# Patient Record
Sex: Female | Born: 1955 | Race: Black or African American | Hispanic: No | Marital: Married | Smoking: Never smoker
Health system: Southern US, Community
[De-identification: ages and names within clinical notes are randomized; demographics above are authoritative.]

## PROBLEM LIST (undated history)

## (undated) ENCOUNTER — Emergency Department (HOSPITAL_COMMUNITY): Payer: Medicare PPO

## (undated) DIAGNOSIS — Z87442 Personal history of urinary calculi: Secondary | ICD-10-CM

## (undated) DIAGNOSIS — I10 Essential (primary) hypertension: Secondary | ICD-10-CM

## (undated) DIAGNOSIS — E7800 Pure hypercholesterolemia, unspecified: Secondary | ICD-10-CM

## (undated) DIAGNOSIS — F419 Anxiety disorder, unspecified: Secondary | ICD-10-CM

## (undated) DIAGNOSIS — E039 Hypothyroidism, unspecified: Secondary | ICD-10-CM

## (undated) DIAGNOSIS — C801 Malignant (primary) neoplasm, unspecified: Secondary | ICD-10-CM

## (undated) DIAGNOSIS — N200 Calculus of kidney: Secondary | ICD-10-CM

## (undated) HISTORY — PX: WISDOM TOOTH EXTRACTION: SHX21

## (undated) HISTORY — PX: ABDOMINAL HYSTERECTOMY: SHX81

## (undated) HISTORY — PX: NECK SURGERY: SHX720

## (undated) HISTORY — PX: KIDNEY SURGERY: SHX687

---

## 2014-12-08 ENCOUNTER — Encounter (HOSPITAL_COMMUNITY): Payer: Self-pay | Admitting: Nurse Practitioner

## 2014-12-08 ENCOUNTER — Emergency Department (HOSPITAL_COMMUNITY)
Admission: EM | Admit: 2014-12-08 | Discharge: 2014-12-08 | Disposition: A | Discharge status: Home / Self Care | Payer: Medicare Other | Attending: Emergency Medicine | Admitting: Emergency Medicine

## 2014-12-08 ENCOUNTER — Encounter (HOSPITAL_COMMUNITY): Payer: Self-pay | Admitting: *Deleted

## 2014-12-08 ENCOUNTER — Emergency Department (INDEPENDENT_AMBULATORY_CARE_PROVIDER_SITE_OTHER)
Admission: EM | Admit: 2014-12-08 | Discharge: 2014-12-08 | Disposition: A | Discharge status: Other Acute Inpatient Hospital | Payer: Medicare Other | Source: Home / Self Care | Attending: Family Medicine | Admitting: Family Medicine

## 2014-12-08 DIAGNOSIS — R197 Diarrhea, unspecified: Secondary | ICD-10-CM | POA: Diagnosis not present

## 2014-12-08 DIAGNOSIS — K2961 Other gastritis with bleeding: Secondary | ICD-10-CM | POA: Diagnosis not present

## 2014-12-08 DIAGNOSIS — Z87442 Personal history of urinary calculi: Secondary | ICD-10-CM | POA: Diagnosis not present

## 2014-12-08 DIAGNOSIS — R5383 Other fatigue: Secondary | ICD-10-CM | POA: Insufficient documentation

## 2014-12-08 DIAGNOSIS — K921 Melena: Secondary | ICD-10-CM | POA: Diagnosis not present

## 2014-12-08 DIAGNOSIS — Z79899 Other long term (current) drug therapy: Secondary | ICD-10-CM | POA: Diagnosis not present

## 2014-12-08 DIAGNOSIS — R110 Nausea: Secondary | ICD-10-CM | POA: Insufficient documentation

## 2014-12-08 DIAGNOSIS — I10 Essential (primary) hypertension: Secondary | ICD-10-CM | POA: Insufficient documentation

## 2014-12-08 DIAGNOSIS — Z880 Allergy status to penicillin: Secondary | ICD-10-CM | POA: Diagnosis not present

## 2014-12-08 HISTORY — DX: Essential (primary) hypertension: I10

## 2014-12-08 HISTORY — DX: Calculus of kidney: N20.0

## 2014-12-08 LAB — POCT I-STAT, CHEM 8
BUN: 15 mg/dL (ref 6–20)
CALCIUM ION: 1.22 mmol/L (ref 1.13–1.30)
CHLORIDE: 103 mmol/L (ref 101–111)
CREATININE: 1 mg/dL (ref 0.44–1.00)
GLUCOSE: 94 mg/dL (ref 65–99)
HCT: 42 % (ref 36.0–46.0)
Hemoglobin: 14.3 g/dL (ref 12.0–15.0)
Potassium: 3.7 mmol/L (ref 3.5–5.1)
Sodium: 145 mmol/L (ref 135–145)
TCO2: 23 mmol/L (ref 0–100)

## 2014-12-08 LAB — CBC
HCT: 39 % (ref 36.0–46.0)
HEMOGLOBIN: 12.6 g/dL (ref 12.0–15.0)
MCH: 27.9 pg (ref 26.0–34.0)
MCHC: 32.3 g/dL (ref 30.0–36.0)
MCV: 86.3 fL (ref 78.0–100.0)
Platelets: 169 10*3/uL (ref 150–400)
RBC: 4.52 MIL/uL (ref 3.87–5.11)
RDW: 13.6 % (ref 11.5–15.5)
WBC: 3.2 10*3/uL — AB (ref 4.0–10.5)

## 2014-12-08 LAB — COMPREHENSIVE METABOLIC PANEL
ALK PHOS: 89 U/L (ref 38–126)
ALT: 19 U/L (ref 14–54)
ANION GAP: 6 (ref 5–15)
AST: 24 U/L (ref 15–41)
Albumin: 3.7 g/dL (ref 3.5–5.0)
BILIRUBIN TOTAL: 0.5 mg/dL (ref 0.3–1.2)
BUN: 12 mg/dL (ref 6–20)
CALCIUM: 9.2 mg/dL (ref 8.9–10.3)
CO2: 24 mmol/L (ref 22–32)
Chloride: 110 mmol/L (ref 101–111)
Creatinine, Ser: 1.01 mg/dL — ABNORMAL HIGH (ref 0.44–1.00)
GFR, EST NON AFRICAN AMERICAN: 59 mL/min — AB (ref >60)
GLUCOSE: 98 mg/dL (ref 65–99)
POTASSIUM: 4.1 mmol/L (ref 3.5–5.1)
Sodium: 140 mmol/L (ref 135–145)
TOTAL PROTEIN: 7 g/dL (ref 6.5–8.1)

## 2014-12-08 LAB — URINALYSIS, ROUTINE W REFLEX MICROSCOPIC
BILIRUBIN URINE: NEGATIVE
Glucose, UA: NEGATIVE mg/dL
Hgb urine dipstick: NEGATIVE
Ketones, ur: NEGATIVE mg/dL
LEUKOCYTES UA: NEGATIVE
NITRITE: NEGATIVE
Protein, ur: NEGATIVE mg/dL
SPECIFIC GRAVITY, URINE: 1.024 (ref 1.005–1.030)
pH: 6 (ref 5.0–8.0)

## 2014-12-08 LAB — POC OCCULT BLOOD, ED: FECAL OCCULT BLD: NEGATIVE

## 2014-12-08 MED ORDER — ONDANSETRON HCL 4 MG PO TABS: 4.0000 mg | ORAL_TABLET | Freq: Four times a day (QID) | ORAL | Status: DC

## 2014-12-08 MED ORDER — ONDANSETRON HCL 4 MG/2ML IJ SOLN
4.0000 mg | Freq: Once | INTRAMUSCULAR | Status: AC
  Administered 2014-12-08: 4 mg via INTRAVENOUS
  Filled 2014-12-08: qty 2

## 2014-12-08 MED ORDER — SODIUM CHLORIDE 0.9 % IV BOLUS (SEPSIS)
1000.0000 mL | Freq: Once | INTRAVENOUS | Status: AC
  Administered 2014-12-08: 1000 mL via INTRAVENOUS

## 2014-12-08 NOTE — ED Provider Notes (Signed)
CSN: [id number]     Arrival date & time 12/08/14  1301 History   First MD Initiated Contact with Patient 12/08/14 1317     Chief Complaint  Patient presents with  . Nausea   (Consider location/radiation/quality/duration/timing/severity/associated sxs/prior Treatment) Patient is a 59 y.o. female presenting with cramps. The history is provided by the patient.  Abdominal Cramping This is a new problem. The current episode started more than 1 week ago. The problem has been gradually worsening. Associated symptoms include abdominal pain. Pertinent negatives include no chest pain.    Past Medical History  Diagnosis Date  . Hypertension    Past Surgical History  Procedure Laterality Date  . Kidney surgery     History reviewed. No pertinent family history. Social History  Substance Use Topics  . Smoking status: Never Smoker   . Smokeless tobacco: None  . Alcohol Use: No   OB History    No data available     Review of Systems  Cardiovascular: Negative.  Negative for chest pain.  Gastrointestinal: Positive for nausea, abdominal pain, diarrhea, blood in stool and anal bleeding. Negative for constipation.    Allergies  Review of patient's allergies indicates no known allergies.  Home Medications   Prior to Admission medications   Medication Sig Start Date End Date Taking? Authorizing Provider  FLUoxetine HCl (PROZAC PO) Take by mouth.   Yes Historical Provider, MD  Gabapentin (NEURONTIN PO) Take by mouth.   Yes Historical Provider, MD  LamoTRIgine (LAMICTAL PO) Take by mouth.   Yes Historical Provider, MD   Meds Ordered and Administered this Visit  Medications - No data to display  BP 166/96 mmHg  Pulse 77  Temp(Src) 98.1 F (36.7 C) (Oral)  Resp 16  SpO2 98% No data found.   Physical Exam  Constitutional: She is oriented to person, place, and time. She appears well-developed and well-nourished. She appears distressed.  Neck: Normal range of motion. Neck supple.   Cardiovascular: Regular rhythm and normal heart sounds.   Pulmonary/Chest: Effort normal and breath sounds normal.  Abdominal: Soft. Bowel sounds are normal. She exhibits no distension and no mass. There is no tenderness. There is no rebound and no guarding.  Lymphadenopathy:    She has no cervical adenopathy.  Neurological: She is alert and oriented to person, place, and time.  Skin: Skin is warm and dry.  Nursing note and vitals reviewed.   ED Course  Procedures (including critical care time)  Labs Review Labs Reviewed  POCT I-STAT, CHEM 8   i-stat8 wnl.  Imaging Review No results found.   Visual Acuity Review  Right Eye Distance:   Left Eye Distance:   Bilateral Distance:    Right Eye Near:   Left Eye Near:    Bilateral Near:         MDM   1. Gastrointestinal hemorrhage associated with other gastritis    Sent for eval of gi bleed, i-stat wnl.    Billy Fischer, MD 12/08/14 [phone]

## 2014-12-08 NOTE — ED Provider Notes (Signed)
CSN: [id number]     Arrival date & time 12/08/14  1422 History   First MD Initiated Contact with Patient 12/08/14 1737     Chief Complaint  Patient presents with  . Nausea   HPI  Catherine [name] is a 59 year old female presenting with nausea and hematochezia. She states she has felt nauseated and "dehydrated" for the past 2 weeks. She states that her stomach feels unsettled but denies any pain. She has not vomited with her nausea. She also reports noting bright red blood in her stool yesterday. She reports feeling constipated a few weeks ago and has had loose stools over the past few days with her nausea. She denies painful bowel movements. Her last colonoscopy was 6 years ago and was normal. She has no known sick contacts or recent travel. She was seen at urgent care earlier today for these complaints and told to come to ED for further evaluation. Denies fevers, chills, headache, dizziness, syncope, blurred vision, chest pain, SOB, cough, dysuria, hematuria, back pain or myalgias. She does endorse fatigue x 2 weeks.   Past Medical History  Diagnosis Date  . Hypertension   . Kidney stones    Past Surgical History  Procedure Laterality Date  . Kidney surgery    . Neck surgery     History reviewed. No pertinent family history. Social History  Substance Use Topics  . Smoking status: Never Smoker   . Smokeless tobacco: None  . Alcohol Use: No   OB History    No data available     Review of Systems  Constitutional: Positive for fatigue. Negative for fever and chills.  HENT: Negative for dental problem and facial swelling.   Eyes: Negative for pain and visual disturbance.  Respiratory: Negative for cough and shortness of breath.   Cardiovascular: Negative for chest pain.  Gastrointestinal: Positive for nausea and blood in stool. Negative for vomiting, abdominal pain and abdominal distention.  Musculoskeletal: Negative for myalgias, back pain, joint swelling, arthralgias, gait problem and  neck pain.  Skin: Negative for wound.  Neurological: Negative for dizziness, syncope, weakness and headaches.  Psychiatric/Behavioral: Negative for confusion.  All other systems reviewed and are negative.     Allergies  Penicillins  Home Medications   Prior to Admission medications   Medication Sig Start Date End Date Taking? Authorizing Provider  CALCIUM PO Take 1 tablet by mouth daily.   Yes Historical Provider, MD  FLUoxetine (PROZAC) 40 MG capsule Take 40 mg by mouth daily. 10/25/14  Yes Historical Provider, MD  gabapentin (NEURONTIN) 100 MG capsule Take 100 mg by mouth daily. 11/19/14  Yes Historical Provider, MD  lamoTRIgine (LAMICTAL) 200 MG tablet Take 200 mg by mouth daily. 10/25/14  Yes Historical Provider, MD  metoprolol tartrate (LOPRESSOR) 25 MG tablet Take 12.5 mg by mouth 2 (two) times daily. 10/25/14  Yes Historical Provider, MD  Multiple Vitamins-Minerals (MULTIVITAMIN PO) Take 1 tablet by mouth daily.   Yes Historical Provider, MD  QUEtiapine (SEROQUEL) 400 MG tablet Take 400 mg by mouth daily. 10/25/14  Yes Historical Provider, MD  ondansetron (ZOFRAN) 4 MG tablet Take 1 tablet (4 mg total) by mouth every 6 (six) hours. 12/08/14   Rinaldo Macqueen, PA-C   BP 148/77 mmHg  Pulse 58  Temp(Src) 97.9 F (36.6 C) (Oral)  Resp 16  Ht 5\' 3"  (1.6 m)  Wt 74.345 kg  BMI 29.04 kg/m2  SpO2 100% Physical Exam  Constitutional: She appears well-developed and well-nourished. No distress.  HENT:  Head: Normocephalic and atraumatic.  Mouth/Throat: Mucous membranes are dry.  Eyes: Conjunctivae are normal. Right eye exhibits no discharge. Left eye exhibits no discharge. No scleral icterus.  Neck: Normal range of motion.  Cardiovascular: Normal rate, regular rhythm and normal heart sounds.   Pulmonary/Chest: Effort normal. No respiratory distress. She has no wheezes. She has no rales.  Abdominal: Soft. She exhibits no distension. There is no tenderness. There is no rebound and no  guarding.  Decreased bowel sounds. Abdomen is soft, non-tender without peritoneal signs  Genitourinary: Guaiac negative stool.  Small amount of soft, brown stool on rectal exam without frank blood. No hemorrhoids or anal fissures. Negative hemoccult  Musculoskeletal: Normal range of motion.  Moves all extremities spontaneously and without pain  Neurological: She is alert. Coordination normal.  Skin: Skin is warm and dry.  Psychiatric: She has a normal mood and affect. Her behavior is normal.  Nursing note and vitals reviewed.   ED Course  Procedures (including critical care time) Labs Review Labs Reviewed  COMPREHENSIVE METABOLIC PANEL - Abnormal; Notable for the following:    Creatinine, Ser 1.01 (*)    GFR calc non Af Amer 59 (*)    All other components within normal limits  CBC - Abnormal; Notable for the following:    WBC 3.2 (*)    All other components within normal limits  URINALYSIS, ROUTINE W REFLEX MICROSCOPIC (NOT AT [facility])  POC OCCULT BLOOD, ED    Imaging Review No results found. I have personally reviewed and evaluated these images and lab results as part of my medical decision-making.   EKG Interpretation None      MDM   Final diagnoses:  Nausea  Diarrhea, unspecified type   59 year old female presenting with nausea and fatigue x 2 weeks. She also notes diarrhea over the past few days. She states yesterday she noted bright red blood in her stool which prompted her to come to the emergency department. Vital signs stable. Patient is nontoxic, nonseptic appearing. Dry mucous membranes. Abdomen is soft, nontender without peritoneal signs. Small amount of soft brown stool on rectal exam without frank blood. Negative Hemoccult. Blood work and urinalysis unremarkable. Patient reports symptom improvement after a bolus and Zofran. Patient likely has a viral gastroenteritis. Will discharge home with zofran and instruction to follow up with PCP. Return precautions given in  discharge paperwork and discussed with pt at bedside. Pt stable for discharge     Josephina Gip, PA-C 12/08/14 2038  Sherwood Gambler, MD 12/09/14 0001

## 2014-12-08 NOTE — ED Notes (Signed)
Pt   Reports      Some    Rectal  Bleeding         When  She  Has       A    bm  And  Some  Nausea                denys  Any  Pain            symptoms  Not  releived  By  pepto  bismal

## 2014-12-08 NOTE — Discharge Instructions (Signed)
Diarrhea °Diarrhea is frequent loose and watery bowel movements. It can cause you to feel weak and dehydrated. Dehydration can cause you to become tired and thirsty, have a dry mouth, and have decreased urination that often is dark yellow. Diarrhea is a sign of another problem, most often an infection that will not last long. In most cases, diarrhea typically lasts 2-3 days. However, it can last longer if it is a sign of something more serious. It is important to treat your diarrhea as directed by your caregiver to lessen or prevent future episodes of diarrhea. °CAUSES  °Some common causes include: °· Gastrointestinal infections caused by viruses, bacteria, or parasites. °· Food poisoning or food allergies. °· Certain medicines, such as antibiotics, chemotherapy, and laxatives. °· Artificial sweeteners and fructose. °· Digestive disorders. °HOME CARE INSTRUCTIONS °· Ensure adequate fluid intake (hydration): Have 1 cup (8 oz) of fluid for each diarrhea episode. Avoid fluids that contain simple sugars or sports drinks, fruit juices, whole milk products, and sodas. Your urine should be clear or pale yellow if you are drinking enough fluids. Hydrate with an oral rehydration solution that you can purchase at pharmacies, retail stores, and online. You can prepare an oral rehydration solution at home by mixing the following ingredients together: °·  - tsp table salt. °· ¾ tsp baking soda. °·  tsp salt substitute containing potassium chloride. °· 1  tablespoons sugar. °· 1 L (34 oz) of water. °· Certain foods and beverages may increase the speed at which food moves through the gastrointestinal (GI) tract. These foods and beverages should be avoided and include: °· Caffeinated and alcoholic beverages. °· High-fiber foods, such as raw fruits and vegetables, nuts, seeds, and whole grain breads and cereals. °· Foods and beverages sweetened with sugar alcohols, such as xylitol, sorbitol, and mannitol. °· Some foods may be well  tolerated and may help thicken stool including: °· Starchy foods, such as rice, toast, pasta, low-sugar cereal, oatmeal, grits, baked potatoes, crackers, and bagels. °· Bananas. °· Applesauce. °· Add probiotic-rich foods to help increase healthy bacteria in the GI tract, such as yogurt and fermented milk products. °· Wash your hands well after each diarrhea episode. °· Only take over-the-counter or prescription medicines as directed by your caregiver. °· Take a warm bath to relieve any burning or pain from frequent diarrhea episodes. °SEEK IMMEDIATE MEDICAL CARE IF:  °· You are unable to keep fluids down. °· You have persistent vomiting. °· You have blood in your stool, or your stools are black and tarry. °· You do not urinate in 6-8 hours, or there is only a small amount of very dark urine. °· You have abdominal pain that increases or localizes. °· You have weakness, dizziness, confusion, or light-headedness. °· You have a severe headache. °· Your diarrhea gets worse or does not get better. °· You have a fever or persistent symptoms for more than 2-3 days. °· You have a fever and your symptoms suddenly get worse. °MAKE SURE YOU:  °· Understand these instructions. °· Will watch your condition. °· Will get help right away if you are not doing well or get worse. °  °This information is not intended to replace advice given to you by your health care provider. Make sure you discuss any questions you have with your health care provider. °  °Document Released: 12/10/2001 Document Revised: 01/10/2014 Document Reviewed: 08/28/2011 °Elsevier Interactive Patient Education ©2016 Elsevier Inc. ° °Nausea and Vomiting °Nausea is a sick feeling that often comes   before throwing up (vomiting). Vomiting is a reflex where stomach contents come out of your mouth. Vomiting can cause severe loss of body fluids (dehydration). Children and elderly adults can become dehydrated quickly, especially if they also have diarrhea. Nausea and  vomiting are symptoms of a condition or disease. It is important to find the cause of your symptoms. °CAUSES  °· Direct irritation of the stomach lining. This irritation can result from increased acid production (gastroesophageal reflux disease), infection, food poisoning, taking certain medicines (such as nonsteroidal anti-inflammatory drugs), alcohol use, or tobacco use. °· Signals from the brain. These signals could be caused by a headache, heat exposure, an inner ear disturbance, increased pressure in the brain from injury, infection, a tumor, or a concussion, pain, emotional stimulus, or metabolic problems. °· An obstruction in the gastrointestinal tract (bowel obstruction). °· Illnesses such as diabetes, hepatitis, gallbladder problems, appendicitis, kidney problems, cancer, sepsis, atypical symptoms of a heart attack, or eating disorders. °· Medical treatments such as chemotherapy and radiation. °· Receiving medicine that makes you sleep (general anesthetic) during surgery. °DIAGNOSIS °Your caregiver may ask for tests to be done if the problems do not improve after a few days. Tests may also be done if symptoms are severe or if the reason for the nausea and vomiting is not clear. Tests may include: °· Urine tests. °· Blood tests. °· Stool tests. °· Cultures (to look for evidence of infection). °· X-rays or other imaging studies. °Test results can help your caregiver make decisions about treatment or the need for additional tests. °TREATMENT °You need to stay well hydrated. Drink frequently but in small amounts. You may wish to drink water, sports drinks, clear broth, or eat frozen ice pops or gelatin dessert to help stay hydrated. When you eat, eating slowly may help prevent nausea. There are also some antinausea medicines that may help prevent nausea. °HOME CARE INSTRUCTIONS  °· Take all medicine as directed by your caregiver. °· If you do not have an appetite, do not force yourself to eat. However, you must  continue to drink fluids. °· If you have an appetite, eat a normal diet unless your caregiver tells you differently. °¨ Eat a variety of complex carbohydrates (rice, wheat, potatoes, bread), lean meats, yogurt, fruits, and vegetables. °¨ Avoid high-fat foods because they are more difficult to digest. °· Drink enough water and fluids to keep your urine clear or pale yellow. °· If you are dehydrated, ask your caregiver for specific rehydration instructions. Signs of dehydration may include: °¨ Severe thirst. °¨ Dry lips and mouth. °¨ Dizziness. °¨ Dark urine. °¨ Decreasing urine frequency and amount. °¨ Confusion. °¨ Rapid breathing or pulse. °SEEK IMMEDIATE MEDICAL CARE IF:  °· You have blood or brown flecks (like coffee grounds) in your vomit. °· You have black or bloody stools. °· You have a severe headache or stiff neck. °· You are confused. °· You have severe abdominal pain. °· You have chest pain or trouble breathing. °· You do not urinate at least once every 8 hours. °· You develop cold or clammy skin. °· You continue to vomit for longer than 24 to 48 hours. °· You have a fever. °MAKE SURE YOU:  °· Understand these instructions. °· Will watch your condition. °· Will get help right away if you are not doing well or get worse. °  °This information is not intended to replace advice given to you by your health care provider. Make sure you discuss any questions you have with your health care   provider.   Document Released: 12/20/2004 Document Revised: 03/14/2011 Document Reviewed: 05/19/2010 Elsevier Interactive Patient Education 2016 [employer].   Emergency Department Resource Guide 1) Find a Doctor and Pay Out of Pocket Although you won't have to find out who is covered by your insurance plan, it is a good idea to ask around and get recommendations. You will then need to call the office and see if the doctor you have chosen will accept you as a new patient and what types of options they offer for  patients who are self-pay. Some doctors offer discounts or will set up payment plans for their patients who do not have insurance, but you will need to ask so you aren't surprised when you get to your appointment.  2) Contact Your Local Health Department Not all health departments have doctors that can see patients for sick visits, but many do, so it is worth a call to see if yours does. If you don't know where your local health department is, you can check in your phone book. The CDC also has a tool to help you locate your state's health department, and many state websites also have listings of all of their local health departments.  3) Find a [facility] If your illness is not likely to be very severe or complicated, you may want to try a walk in clinic. These are popping up all over the country in pharmacies, drugstores, and shopping centers. They're usually staffed by nurse practitioners or physician assistants that have been trained to treat common illnesses and complaints. They're usually fairly quick and inexpensive. However, if you have serious medical issues or chronic medical problems, these are probably not your best option.  No Primary Care Doctor: - Call Health Connect at  [phone] - they can help you locate a primary care doctor that  accepts your insurance, provides certain services, etc. - Physician Referral Service- [phone]  Chronic Pain Problems: Organization         Address  Phone   Notes  [facility]  [phone] Patients need to be referred by their primary care doctor.   Medication Assistance: Organization         Address  Phone   Notes  [facility] [place] [address] [place], [place] [address] [phone] --Must be a resident of [facility] -- Must have NO insurance coverage whatsoever (no Medicaid/ Medicare, etc.) -- The pt. MUST have a primary care doctor that directs their care regularly  and follows them in the community   Med[facility]ist  [phone]   [employer]  [phone]    Agencies that provide inexpensive medical care: Organization         Address  Phone   Notes  [place]  [phone]   [facility]    [phone]   [facility] [place], [place] [address] [phone]   [place] [address], [place] [phone]   Planned Parenthood    [phone]   [facility]    [phone]   [place] [address], [place] Phone:  [phone], Fax:  [phone] Hours of Operation:  9 am - 6 pm, M-F.  Also accepts Medicaid/Medicare and self-pay.  [facility] [place] [address], [place] Phone: [phone], Fax: [phone])  [id number]. Hours of Operation:  8:30 am - 5:30 pm, M-F.  Also accepts Medicaid and self-pay.  [facility] [address], [place] Phone: [phone]   [place], [place], [place] [phone], Ext. [phone] Mondays & Thursdays: 7-9 AM.  First 15 patients are seen on a first come, first serve basis.    [place] Providers:  Organization         Address  Phone   Notes  [facility] [address], [place] [phone] Also accepts self-pay patients.  [facility] [id number] [place], [place]  [phone]   [place], [address], [place] [phone]   [facility] [address], [place] [phone]   Lucianne Lei [address], [place]   [phone] Only accepts [place] Access [place] patients after they have their name applied to their card.   Self-Pay (no insurance) in [facility]:  Organization         Address  Phone   Notes  Sickle  Cell Patients, [facility] [place] [phone]   [facility] [place] [phone]   [facility] [place]  [place], [place], [place] [phone]   [facility]/Dr. Osei-Bonsu  [address] [place] or [address], [place] [phone] Phone number for both [place] and [place] locations is the same.  [facility] [address] [place] [phone]   [facility] [address], [place] or [address] [phone]   [facility] [address] [place] [phone], phone; [phone], fax Sees patients 1st and 3rd Saturday of every month.  Must not qualify for public or private insurance (i.e. Medicaid, Medicare, [place] Health Choice, Veterans' Benefits)  Household income should be no more than 200% of the poverty level The clinic cannot treat you if you are pregnant or think you are pregnant  Sexually transmitted diseases are not treated at the clinic.    Dental Care: Organization         Address  Phone  Notes  [facility] [place] [phone] Accepts children up to age 43 who are enrolled in [place] or [place]; pregnant women with a Medicaid card; and children who have applied for Medicaid or [place] Health Choice, but were declined, whose parents can pay a reduced fee at time of service.  [facility] of [facility]  [address], [place] [phone] Accepts children up to age 49 who are enrolled in [place] or [place]; pregnant women with a Medicaid card; and children who have applied for Medicaid or [place] Health Choice, but were declined, whose parents can pay a reduced fee at time of service.  [facility]  Access PROGRAM  [place] [phone] Patients are seen by appointment only. Walk-ins are not accepted. [place] will see patients 80 years of age and older. Monday - Tuesday (8am-5pm) Most Wednesdays (8:30-5pm) $30 per visit, cash only  [facility] Adult [employer] PROGRAM  [address], [place] [phone]  Patients are seen by appointment only. Walk-ins are not accepted. [place] will see patients 97 years of age and older. One Wednesday Evening (Monthly: Volunteer Based).  $30 per visit, cash only  [place]  [phone] for adults; Children under age 58, call Graduate Pediatric Dentistry at [phone]. Children aged 36-14, please call [phone] to request a pediatric application.  Dental services are provided in all areas of dental care including fillings, crowns and bridges, complete and partial dentures, implants, gum treatment, root canals, and extractions. Preventive care is also provided. Treatment is provided to both adults and children. Patients are selected via a lottery and there is often a waiting list.   [facility] [address], [place]  [phone] [email or web address]   [facility] [address] [place], [place] [phone], Ext. [phone] Second and Fourth Thursday of each month, opens at 6:30 AM; Clinic ends at 9 AM.  Patients are seen on a first-come first-served basis, and a limited number are seen during each clinic.   [facility]  [address] Hillard Danker [place], [place] [phone]   Eligibility Requirements You must have lived in [place], [place], or [place] counties for at least the last three months.   You cannot be eligible for state or federal sponsored [employer], including [employer], [place], or [employer].   You generally cannot be eligible for healthcare insurance through your employer.    How to apply: Eligibility  screenings are held every Tuesday and Wednesday afternoon from 1:00 pm until 4:00 pm. You do not need an appointment for the interview!  [facility] [address] [place], [place]   [place]  [place] Department  [place]  [phone]    Behavioral Health Resources in the Community: Intensive Outpatient Programs Organization         Address  Phone  Notes  [place]eddito [place]. [address], [place], [place] [phone]   [facility] [address], [place] [employer], [place]   ADS: Alcohol & Drug Svcs [address], [place], [place]   [place] [address],  [place], [place] or [phone]   Substance Abuse Resources Organization         Address  Phone  Notes  Alcohol and Drug Services  [phone]   [place]  [phone]   [facility] [place]   Chinita Pester  [phone]   Residential & Outpatient Substance Abuse Program  [phone]   Psychological Services Organization         Address  Phone  Notes  [facility] [place]  [place]  [phone]   [place] [address], [place] or [phone]    Mobile Crisis Teams Organization         Address  Phone  Notes  [facility], Mobile Crisis Care Unit  [phone]   [facility]  [address] [place], [place]   [place] [address], [place] [phone]    Self-Help/Support Groups Organization         Address  Phone             Notes  [place]. of [place] - variety of support groups  [place] Call  for more information  Narcotics Anonymous (NA), [facility] [address], [place]  2 meetings at  this location   Residential Facilities manager         Address  Phone  Notes  [facility] [place],    [place]  [phone]   [facility]  [address], [place] [id number], [place], [place]   [place], [place] [phone] Admissions: 8am-3pm M-F  Incentives Substance [place] [address]    [place], [place] [phone]   [facility] [address] [place], [place], [place]   [facility] [address]  [place], [place]   [facility] - Intensive Outpatient [address], [place], [place]   [facility] ([place].,  [place], [place] [phone] or [phone]   [facility] Services (RTS) [address] [place], [place] Accepts Medicaid  [facility] [place] [address]  [place] [phone] Substance Abuse/Addiction Treatment   [facility] Organization         Address  Phone  Notes  [facility]  [phone]   Domenic Schwab, PhD [address] Arlis Porta [place], [place]   [phone] or [phone]   [place], [place] [phone]   [facility] [address] [place], [place] [phone] Insurance/Medicaid/sponsorship through [facility] and Families [address]                                    [place], [place] [phone] [place] [address], [place] [phone]    Dr. Adele Schilder  [phone]   [facility] [place] Dept. 1) [address] [place] 2) [place] 3)  [place] [address], [place] [phone]  [phone]   [place] [phone] or [phone] (After Hours)

## 2014-12-08 NOTE — ED Notes (Signed)
She c/o 2 week history nausea, "feeling dehydrated" and yesterday she noticed blood in her stool x1.  She denies any pain. She had a colonoscopy 6 years.

## 2015-04-07 ENCOUNTER — Ambulatory Visit (INDEPENDENT_AMBULATORY_CARE_PROVIDER_SITE_OTHER)
Admission: EM | Admit: 2015-04-07 | Discharge: 2015-04-07 | Disposition: A | Discharge status: Home / Self Care | Payer: Medicare Other | Source: Home / Self Care

## 2015-04-07 ENCOUNTER — Encounter (HOSPITAL_COMMUNITY): Payer: Self-pay | Admitting: *Deleted

## 2015-04-07 DIAGNOSIS — M7582 Other shoulder lesions, left shoulder: Secondary | ICD-10-CM | POA: Diagnosis not present

## 2015-04-07 MED ORDER — NAPROXEN 375 MG PO TABS: 375.0000 mg | ORAL_TABLET | Freq: Two times a day (BID) | ORAL | Status: DC

## 2015-04-07 NOTE — Discharge Instructions (Signed)

## 2015-04-07 NOTE — ED Provider Notes (Signed)
CSN: [id number]     Arrival date & time 04/07/15  1332 History   None    No chief complaint on file.  (Consider location/radiation/quality/duration/timing/severity/associated sxs/prior Treatment) Patient is a 60 y.o. female presenting with shoulder pain. The history is provided by the patient.  Shoulder Pain Location:  Shoulder Time since incident:  5 months Injury: no   Shoulder location:  L shoulder Pain details:    Quality:  Aching   Radiates to:  Does not radiate   Severity:  Mild   Onset quality:  Gradual   Duration:  5 months   Timing:  Constant   Progression:  Waxing and waning Chronicity:  Recurrent Handedness:  Right-handed Dislocation: no   Foreign body present:  No foreign bodies Prior injury to area:  No Relieved by:  Nothing Worsened by:  Nothing tried Ineffective treatments:  None tried   Past Medical History  Diagnosis Date  . Hypertension   . Kidney stones    Past Surgical History  Procedure Laterality Date  . Kidney surgery    . Neck surgery     No family history on file. Social History  Substance Use Topics  . Smoking status: Never Smoker   . Smokeless tobacco: Not on file  . Alcohol Use: No   OB History    No data available     Review of Systems  Constitutional: Negative.   HENT: Negative.   Eyes: Negative.   Respiratory: Negative.   Cardiovascular: Negative.   Gastrointestinal: Negative.   Endocrine: Negative.   Genitourinary: Negative.   Musculoskeletal: Positive for arthralgias.  Skin: Negative.   Allergic/Immunologic: Negative.   Neurological: Negative.   Hematological: Negative.   Psychiatric/Behavioral: Negative.     Allergies  Penicillins  Home Medications   Prior to Admission medications   Medication Sig Start Date End Date Taking? Authorizing Provider  CALCIUM PO Take 1 tablet by mouth daily.    Historical Provider, MD  FLUoxetine (PROZAC) 40 MG capsule Take 40 mg by mouth daily. 10/25/14   Historical Provider, MD   gabapentin (NEURONTIN) 100 MG capsule Take 100 mg by mouth daily. 11/19/14   Historical Provider, MD  lamoTRIgine (LAMICTAL) 200 MG tablet Take 200 mg by mouth daily. 10/25/14   Historical Provider, MD  metoprolol tartrate (LOPRESSOR) 25 MG tablet Take 12.5 mg by mouth 2 (two) times daily. 10/25/14   Historical Provider, MD  Multiple Vitamins-Minerals (MULTIVITAMIN PO) Take 1 tablet by mouth daily.    Historical Provider, MD  ondansetron (ZOFRAN) 4 MG tablet Take 1 tablet (4 mg total) by mouth every 6 (six) hours. 12/08/14   Stevi Barrett, PA-C  QUEtiapine (SEROQUEL) 400 MG tablet Take 400 mg by mouth daily. 10/25/14   Historical Provider, MD   Meds Ordered and Administered this Visit  Medications - No data to display  There were no vitals taken for this visit. No data found.   Physical Exam  Constitutional: She appears well-developed and well-nourished.  HENT:  Head: Normocephalic.  Right Ear: External ear normal.  Left Ear: External ear normal.  Mouth/Throat: Oropharynx is clear and moist.  Eyes: Conjunctivae and EOM are normal. Pupils are equal, round, and reactive to light.  Neck: Normal range of motion. Neck supple.  Cardiovascular: Normal rate, regular rhythm and normal heart sounds.   Pulmonary/Chest: Effort normal and breath sounds normal.  Abdominal: Soft. Bowel sounds are normal.  Musculoskeletal: She exhibits tenderness.  Decreased passive and active ROM left shoulder and discomfort with internal/external rotation  and abduction.    ED Course  Procedures (including critical care time)  Labs Review Labs Reviewed - No data to display  Imaging Review No results found.   Visual Acuity Review  Right Eye Distance:   Left Eye Distance:   Bilateral Distance:    Right Eye Near:   Left Eye Near:    Bilateral Near:         MDM   Left shoulder pain Naprosyn 375mg  one po bid x 10 days.  Recommend circumduction exercises and follow up prn.  Lysbeth Penner,  FNP 04/07/15 [place], [place] 04/07/15 2120

## 2015-04-07 NOTE — ED Notes (Signed)
Left shoulder pain the past 5 months, worse with movement.  No known injury

## 2019-11-02 ENCOUNTER — Ambulatory Visit: Payer: Medicare Other | Attending: Internal Medicine

## 2019-11-02 DIAGNOSIS — Z23 Encounter for immunization: Secondary | ICD-10-CM

## 2019-11-02 NOTE — Progress Notes (Signed)
   Covid-19 Vaccination Clinic  Name:  Catherine [name]    MRN: [id number] DOB: 1955/04/02  11/02/2019  Catherine [name] was observed post Covid-19 immunization for 15 minutes without incident. She was provided with Vaccine Information Sheet and instruction to access the V-Safe system.   Catherine [name] was instructed to call 911 with any severe reactions post vaccine: [place] Kitchen Difficulty breathing  . Swelling of face and throat  . A fast heartbeat  . A bad rash all over body  . Dizziness and weakness

## 2020-05-20 DIAGNOSIS — D49511 Neoplasm of unspecified behavior of right kidney: Secondary | ICD-10-CM | POA: Diagnosis not present

## 2020-05-20 DIAGNOSIS — N200 Calculus of kidney: Secondary | ICD-10-CM | POA: Diagnosis not present

## 2020-06-03 DIAGNOSIS — E785 Hyperlipidemia, unspecified: Secondary | ICD-10-CM | POA: Diagnosis not present

## 2020-06-03 DIAGNOSIS — Z1211 Encounter for screening for malignant neoplasm of colon: Secondary | ICD-10-CM | POA: Diagnosis not present

## 2020-06-03 DIAGNOSIS — E039 Hypothyroidism, unspecified: Secondary | ICD-10-CM | POA: Diagnosis not present

## 2020-06-03 DIAGNOSIS — F419 Anxiety disorder, unspecified: Secondary | ICD-10-CM | POA: Diagnosis not present

## 2020-06-03 DIAGNOSIS — I10 Essential (primary) hypertension: Secondary | ICD-10-CM | POA: Diagnosis not present

## 2020-06-03 DIAGNOSIS — F331 Major depressive disorder, recurrent, moderate: Secondary | ICD-10-CM | POA: Diagnosis not present

## 2020-06-03 DIAGNOSIS — N289 Disorder of kidney and ureter, unspecified: Secondary | ICD-10-CM | POA: Diagnosis not present

## 2020-06-11 DIAGNOSIS — D49511 Neoplasm of unspecified behavior of right kidney: Secondary | ICD-10-CM | POA: Diagnosis not present

## 2020-06-11 DIAGNOSIS — Z90710 Acquired absence of both cervix and uterus: Secondary | ICD-10-CM | POA: Diagnosis not present

## 2020-06-11 DIAGNOSIS — N200 Calculus of kidney: Secondary | ICD-10-CM | POA: Diagnosis not present

## 2020-06-11 DIAGNOSIS — D3001 Benign neoplasm of right kidney: Secondary | ICD-10-CM | POA: Diagnosis not present

## 2020-06-11 DIAGNOSIS — D7389 Other diseases of spleen: Secondary | ICD-10-CM | POA: Diagnosis not present

## 2020-06-16 DIAGNOSIS — L989 Disorder of the skin and subcutaneous tissue, unspecified: Secondary | ICD-10-CM | POA: Diagnosis not present

## 2020-06-19 ENCOUNTER — Other Ambulatory Visit: Payer: Self-pay | Admitting: Urology

## 2020-06-19 DIAGNOSIS — D7389 Other diseases of spleen: Secondary | ICD-10-CM

## 2020-07-15 ENCOUNTER — Ambulatory Visit
Admission: RE | Admit: 2020-07-15 | Discharge: 2020-07-15 | Disposition: A | Discharge status: Home / Self Care | Payer: Medicare Other | Source: Ambulatory Visit | Attending: Urology | Admitting: Urology

## 2020-07-15 ENCOUNTER — Other Ambulatory Visit: Payer: Self-pay

## 2020-07-15 DIAGNOSIS — D7389 Other diseases of spleen: Secondary | ICD-10-CM | POA: Diagnosis not present

## 2020-07-15 DIAGNOSIS — M47816 Spondylosis without myelopathy or radiculopathy, lumbar region: Secondary | ICD-10-CM | POA: Diagnosis not present

## 2020-07-15 DIAGNOSIS — M5137 Other intervertebral disc degeneration, lumbosacral region: Secondary | ICD-10-CM | POA: Diagnosis not present

## 2020-07-15 DIAGNOSIS — D1771 Benign lipomatous neoplasm of kidney: Secondary | ICD-10-CM | POA: Diagnosis not present

## 2020-07-15 MED ORDER — GADOBENATE DIMEGLUMINE 529 MG/ML IV SOLN
16.0000 mL | Freq: Once | INTRAVENOUS | Status: AC | PRN
  Administered 2020-07-15: 16 mL via INTRAVENOUS

## 2020-08-03 DIAGNOSIS — E039 Hypothyroidism, unspecified: Secondary | ICD-10-CM | POA: Diagnosis not present

## 2020-08-03 DIAGNOSIS — F3340 Major depressive disorder, recurrent, in remission, unspecified: Secondary | ICD-10-CM | POA: Diagnosis not present

## 2020-08-03 DIAGNOSIS — I10 Essential (primary) hypertension: Secondary | ICD-10-CM | POA: Diagnosis not present

## 2020-08-03 DIAGNOSIS — D7389 Other diseases of spleen: Secondary | ICD-10-CM | POA: Diagnosis not present

## 2020-08-26 DIAGNOSIS — D170 Benign lipomatous neoplasm of skin and subcutaneous tissue of head, face and neck: Secondary | ICD-10-CM | POA: Diagnosis not present

## 2020-08-26 DIAGNOSIS — L821 Other seborrheic keratosis: Secondary | ICD-10-CM | POA: Diagnosis not present

## 2020-08-31 DIAGNOSIS — N2889 Other specified disorders of kidney and ureter: Secondary | ICD-10-CM | POA: Diagnosis not present

## 2020-08-31 DIAGNOSIS — R161 Splenomegaly, not elsewhere classified: Secondary | ICD-10-CM | POA: Diagnosis not present

## 2020-11-11 DIAGNOSIS — I10 Essential (primary) hypertension: Secondary | ICD-10-CM | POA: Diagnosis not present

## 2020-11-11 DIAGNOSIS — F419 Anxiety disorder, unspecified: Secondary | ICD-10-CM | POA: Diagnosis not present

## 2020-11-11 DIAGNOSIS — D7389 Other diseases of spleen: Secondary | ICD-10-CM | POA: Diagnosis not present

## 2020-11-11 DIAGNOSIS — E785 Hyperlipidemia, unspecified: Secondary | ICD-10-CM | POA: Diagnosis not present

## 2020-11-11 DIAGNOSIS — F3340 Major depressive disorder, recurrent, in remission, unspecified: Secondary | ICD-10-CM | POA: Diagnosis not present

## 2020-11-11 DIAGNOSIS — E039 Hypothyroidism, unspecified: Secondary | ICD-10-CM | POA: Diagnosis not present

## 2020-11-11 DIAGNOSIS — Z8679 Personal history of other diseases of the circulatory system: Secondary | ICD-10-CM | POA: Diagnosis not present

## 2020-11-11 DIAGNOSIS — Z1211 Encounter for screening for malignant neoplasm of colon: Secondary | ICD-10-CM | POA: Diagnosis not present

## 2020-11-16 ENCOUNTER — Other Ambulatory Visit: Payer: Self-pay | Admitting: Urology

## 2020-11-16 DIAGNOSIS — D49511 Neoplasm of unspecified behavior of right kidney: Secondary | ICD-10-CM

## 2020-12-14 ENCOUNTER — Other Ambulatory Visit: Payer: Medicare Other

## 2021-01-12 DIAGNOSIS — Z1231 Encounter for screening mammogram for malignant neoplasm of breast: Secondary | ICD-10-CM | POA: Diagnosis not present

## 2021-01-18 DIAGNOSIS — N6313 Unspecified lump in the right breast, lower outer quadrant: Secondary | ICD-10-CM | POA: Diagnosis not present

## 2021-01-18 DIAGNOSIS — R922 Inconclusive mammogram: Secondary | ICD-10-CM | POA: Diagnosis not present

## 2021-01-29 ENCOUNTER — Other Ambulatory Visit: Payer: Self-pay | Admitting: Hematology and Oncology

## 2021-01-29 DIAGNOSIS — C50511 Malignant neoplasm of lower-outer quadrant of right female breast: Secondary | ICD-10-CM | POA: Diagnosis not present

## 2021-01-29 DIAGNOSIS — N6313 Unspecified lump in the right breast, lower outer quadrant: Secondary | ICD-10-CM | POA: Diagnosis not present

## 2021-01-29 DIAGNOSIS — N6310 Unspecified lump in the right breast, unspecified quadrant: Secondary | ICD-10-CM | POA: Diagnosis not present

## 2021-01-29 DIAGNOSIS — Z170 Estrogen receptor positive status [ER+]: Secondary | ICD-10-CM | POA: Diagnosis not present

## 2021-02-04 ENCOUNTER — Telehealth: Payer: Self-pay | Admitting: Hematology and Oncology

## 2021-02-04 NOTE — Telephone Encounter (Signed)
Scheduled appt per 2/2 staff msg with RN Dawn. Pt is aware of appt date and time. Pt is aware to arrive 15 mins prior to appt time.

## 2021-02-06 NOTE — Progress Notes (Signed)
[name] NOTE  Patient Care Team: Catherine Stains, MD as PCP - General (Family Medicine)  CHIEF COMPLAINTS/PURPOSE OF CONSULTATION:  Newly diagnosed right breast cancer  HISTORY OF PRESENTING ILLNESS:  Catherine [name] 66 y.o. 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 Screening mammogram on 01/12/2021 showed right breast asymmetry. Diagnostic mammogram and [place] on 01/18/2021 showed a persistent irregular mass within the lower outer right breast. 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 She presents to the clinic today for initial evaluation and discussion of treatment options.   I reviewed her records extensively and collaborated the history with the patient.  SUMMARY OF ONCOLOGIC HISTORY: Oncology History  Malignant neoplasm of lower-outer quadrant of right breast of female, estrogen receptor positive ([place])  01/29/2021 Initial Diagnosis   Screening mammogram detected right breast mass 1.2 cm, axilla normal, biopsy revealed grade 2 IDC with DCIS ER 90%, PR 30%, HER2 2+ by IHC FISH pending     MEDICAL HISTORY:  Past Medical History:  Diagnosis Date   Hypertension    Kidney stones     SURGICAL HISTORY: Past Surgical History:  Procedure Laterality Date   KIDNEY SURGERY     NECK SURGERY      SOCIAL HISTORY: Social History   Socioeconomic History   Marital status: Married    Spouse name: Not on file   Number of children: Not on file   Years of education: Not on file   Highest education level: Not on file  Occupational History   Not on file  Tobacco Use   Smoking status: Never   Smokeless tobacco: Not on file  Substance and Sexual Activity   Alcohol use: No   Drug use: No   Sexual activity: Yes    Birth control/protection: Surgical  Other Topics Concern   Not on file  Social History Narrative   Not  on file   Social Determinants of Health   Financial Resource Strain: Not on file  Food Insecurity: Not on file  Transportation Needs: Not on file  Physical Activity: Not on file  Stress: Not on file  Social Connections: Not on file  Intimate Partner Violence: Not on file    FAMILY HISTORY: No family history on file.  ALLERGIES:  is allergic to penicillins.  MEDICATIONS:  Current Outpatient Medications  Medication Sig Dispense Refill   Calcium Magnesium Zinc 333-133-5 MG TABS Take 1 tablet by mouth daily.     ergocalciferol (VITAMIN D2) 1.25 MG (50000 UT) capsule Take 1 capsule (50,000 Units total) by mouth once a week.     letrozole (FEMARA) 2.5 MG tablet Take 1 tablet (2.5 mg total) by mouth daily. 90 tablet 3   levothyroxine (SYNTHROID) 50 MCG tablet Take 1 tablet (50 mcg total) by mouth daily before breakfast.     rosuvastatin (CRESTOR) 5 MG tablet Take 1 tablet (5 mg total) by mouth daily.     metoprolol tartrate (LOPRESSOR) 25 MG tablet Take 12.5 mg by mouth 2 (two) times daily.     No current facility-administered medications for this visit.    REVIEW OF SYSTEMS:   Constitutional: Denies fevers, chills or abnormal night sweats Eyes: Denies blurriness of vision, double vision or watery eyes Ears, nose, mouth, throat, and face: Denies mucositis or sore throat Respiratory: Denies cough, dyspnea or wheezes Cardiovascular: Denies palpitation, chest discomfort or lower extremity  swelling Gastrointestinal:  Denies nausea, heartburn or change in bowel habits Skin: Denies abnormal skin rashes Lymphatics: Denies new lymphadenopathy or easy bruising Neurological:Denies numbness, tingling or new weaknesses Behavioral/Psych: Mood is stable, no new changes  Breast:  Denies any palpable lumps or discharge All other systems were reviewed with the patient and are negative.  PHYSICAL EXAMINATION: ECOG PERFORMANCE STATUS: 0 - Asymptomatic  Vitals:   02/08/21 1205  BP: (!) 145/84   Pulse: 67  Resp: 18  Temp: 97.9 F (36.6 C)  SpO2: 99%   Filed Weights   02/08/21 1205  Weight: 188 lb 4.8 oz (85.4 kg)      LABORATORY DATA:  I have reviewed the data as listed Lab Results  Component Value Date   WBC 3.2 (L) 12/08/2014   HGB 12.6 12/08/2014   HCT 39.0 12/08/2014   MCV 86.3 12/08/2014   PLT 169 12/08/2014   Lab Results  Component Value Date   NA 140 12/08/2014   K 4.1 12/08/2014   CL 110 12/08/2014   CO2 24 12/08/2014    RADIOGRAPHIC STUDIES: I have personally reviewed the radiological reports and agreed with the findings in the report.  ASSESSMENT AND PLAN:  Malignant neoplasm of lower-outer quadrant of right breast of female, estrogen receptor positive (Catherine [name]) Screening mammogram detected right breast mass 1.2 cm 8 o'clock position 9 cm from the nipple, axilla normal, ultrasound-guided biopsy revealed grade 2 IDC with DCIS ER 90%, PR 30%, HER2 2+ by IHC FISH pending, Ki-67 was not done (was done at [facility])  Pathology and radiology counseling:Discussed with the patient, the details of pathology including the type of breast cancer,the clinical staging, the significance of ER, PR and HER-2/neu receptors and the implications for treatment. After reviewing the pathology in detail, we proceeded to discuss the different treatment options between surgery, radiation, chemotherapy, antiestrogen therapies.  Recommendations: 1. Breast conserving surgery followed by 2. Oncotype DX testing to determine if chemotherapy would be of any benefit (we will do it on the biopsy) 3. Adjuvant radiation therapy followed by 4. Adjuvant antiestrogen therapy  Oncotype counseling: I discussed Oncotype DX test. I explained to the patient that this is a 21 gene panel to evaluate patient tumors DNA to calculate recurrence score. This would help determine whether patient has high risk or low risk breast cancer. She understands that if her tumor was found to be high risk, she would  benefit from systemic chemotherapy. If low risk, no need of chemotherapy.  Because it could be another month for surgery I recommended that we start her on neoadjuvant letrozole.  She will stop letrozole 3 days before surgery.  Letrozole counseling: We discussed the risks and benefits of anti-estrogen therapy with aromatase inhibitors. These include but not limited to insomnia, hot flashes, mood changes, vaginal dryness, bone density loss, and weight gain. We strongly believe that the benefits far outweigh the risks. Patient understands these risks and consented to starting treatment. Planned treatment duration is 7 years.  Her friend Colletta [place] is a patient of mine and her friends have been helping her get through this diagnosis and treatment. Return to clinic after surgery to discuss final pathology report and then determine if Oncotype DX testing will need to be sent.    All questions were answered. The patient knows to call the clinic with any problems, questions or concerns.   Rulon Eisenmenger, MD, MPH 02/08/2021    I, Thana Ates, am acting as scribe for Nicholas Lose, MD.  I have  reviewed the above documentation for accuracy and completeness, and I agree with the above.

## 2021-02-08 ENCOUNTER — Other Ambulatory Visit: Payer: Self-pay

## 2021-02-08 ENCOUNTER — Inpatient Hospital Stay: Payer: Medicare PPO | Attending: Hematology and Oncology | Admitting: Hematology and Oncology

## 2021-02-08 DIAGNOSIS — Z79899 Other long term (current) drug therapy: Secondary | ICD-10-CM | POA: Insufficient documentation

## 2021-02-08 DIAGNOSIS — I10 Essential (primary) hypertension: Secondary | ICD-10-CM | POA: Insufficient documentation

## 2021-02-08 DIAGNOSIS — Z170 Estrogen receptor positive status [ER+]: Secondary | ICD-10-CM | POA: Diagnosis not present

## 2021-02-08 DIAGNOSIS — C50511 Malignant neoplasm of lower-outer quadrant of right female breast: Secondary | ICD-10-CM | POA: Diagnosis not present

## 2021-02-08 DIAGNOSIS — Z79811 Long term (current) use of aromatase inhibitors: Secondary | ICD-10-CM | POA: Insufficient documentation

## 2021-02-08 MED ORDER — ERGOCALCIFEROL 1.25 MG (50000 UT) PO CAPS: 50000.0000 [IU] | ORAL_CAPSULE | ORAL | Status: AC

## 2021-02-08 MED ORDER — ROSUVASTATIN CALCIUM 5 MG PO TABS: 5.0000 mg | ORAL_TABLET | Freq: Every day | ORAL | Status: AC

## 2021-02-08 MED ORDER — LEVOTHYROXINE SODIUM 50 MCG PO TABS: 50.0000 ug | ORAL_TABLET | Freq: Every day | ORAL | Status: AC

## 2021-02-08 MED ORDER — CALCIUM MAGNESIUM ZINC 333-133-5 MG PO TABS: 1.0000 | ORAL_TABLET | Freq: Every day | ORAL | Status: AC

## 2021-02-08 MED ORDER — LETROZOLE 2.5 MG PO TABS: 2.5000 mg | ORAL_TABLET | Freq: Every day | ORAL | 3 refills | Status: DC

## 2021-02-08 NOTE — Assessment & Plan Note (Signed)
Screening mammogram detected right breast mass 1.2 cm 8 o'clock position 9 cm from the nipple, axilla normal, ultrasound-guided biopsy revealed grade 2 IDC with DCIS ER 90%, PR 30%, HER2 2+ by IHC FISH pending, Ki-67 was not done  Pathology and radiology counseling:Discussed with the patient, the details of pathology including the type of breast cancer,the clinical staging, the significance of ER, PR and HER-2/neu receptors and the implications for treatment. After reviewing the pathology in detail, we proceeded to discuss the different treatment options between surgery, radiation, chemotherapy, antiestrogen therapies.  Recommendations: 1. Breast conserving surgery followed by 2. Oncotype DX testing to determine if chemotherapy would be of any benefit (we will do it on the biopsy) 3. Adjuvant radiation therapy followed by 4. Adjuvant antiestrogen therapy  Oncotype counseling: I discussed Oncotype DX test. I explained to the patient that this is a 21 gene panel to evaluate patient tumors DNA to calculate recurrence score. This would help determine whether patient has high risk or low risk breast cancer. She understands that if her tumor was found to be high risk, she would benefit from systemic chemotherapy. If low risk, no need of chemotherapy.  Because it could be another month for surgery I recommended that we start her on neoadjuvant letrozole.  She will stop letrozole 3 days before surgery.  Letrozole counseling: We discussed the risks and benefits of anti-estrogen therapy with aromatase inhibitors. These include but not limited to insomnia, hot flashes, mood changes, vaginal dryness, bone density loss, and weight gain. We strongly believe that the benefits far outweigh the risks. Patient understands these risks and consented to starting treatment. Planned treatment duration is 7 years.   Return to clinic after surgery to discuss final pathology report and then determine if Oncotype DX testing  will need to be sent.

## 2021-02-10 ENCOUNTER — Ambulatory Visit
Admission: RE | Admit: 2021-02-10 | Discharge: 2021-02-10 | Disposition: A | Discharge status: Home / Self Care | Payer: Medicare PPO | Source: Ambulatory Visit | Attending: Urology | Admitting: Urology

## 2021-02-10 DIAGNOSIS — R161 Splenomegaly, not elsewhere classified: Secondary | ICD-10-CM | POA: Diagnosis not present

## 2021-02-10 DIAGNOSIS — N2889 Other specified disorders of kidney and ureter: Secondary | ICD-10-CM | POA: Diagnosis not present

## 2021-02-10 DIAGNOSIS — D7389 Other diseases of spleen: Secondary | ICD-10-CM | POA: Diagnosis not present

## 2021-02-10 DIAGNOSIS — D49511 Neoplasm of unspecified behavior of right kidney: Secondary | ICD-10-CM

## 2021-02-10 DIAGNOSIS — D179 Benign lipomatous neoplasm, unspecified: Secondary | ICD-10-CM | POA: Diagnosis not present

## 2021-02-10 MED ORDER — GADOBENATE DIMEGLUMINE 529 MG/ML IV SOLN
15.0000 mL | Freq: Once | INTRAVENOUS | Status: AC | PRN
  Administered 2021-02-10: 15 mL via INTRAVENOUS

## 2021-02-11 DIAGNOSIS — M7989 Other specified soft tissue disorders: Secondary | ICD-10-CM | POA: Diagnosis not present

## 2021-02-11 DIAGNOSIS — E559 Vitamin D deficiency, unspecified: Secondary | ICD-10-CM | POA: Diagnosis not present

## 2021-02-11 DIAGNOSIS — E785 Hyperlipidemia, unspecified: Secondary | ICD-10-CM | POA: Diagnosis not present

## 2021-02-11 DIAGNOSIS — F419 Anxiety disorder, unspecified: Secondary | ICD-10-CM | POA: Diagnosis not present

## 2021-02-11 DIAGNOSIS — C50911 Malignant neoplasm of unspecified site of right female breast: Secondary | ICD-10-CM | POA: Diagnosis not present

## 2021-02-11 DIAGNOSIS — I10 Essential (primary) hypertension: Secondary | ICD-10-CM | POA: Diagnosis not present

## 2021-02-11 DIAGNOSIS — Z1211 Encounter for screening for malignant neoplasm of colon: Secondary | ICD-10-CM | POA: Diagnosis not present

## 2021-02-11 DIAGNOSIS — F331 Major depressive disorder, recurrent, moderate: Secondary | ICD-10-CM | POA: Diagnosis not present

## 2021-02-11 DIAGNOSIS — E039 Hypothyroidism, unspecified: Secondary | ICD-10-CM | POA: Diagnosis not present

## 2021-02-12 ENCOUNTER — Ambulatory Visit: Payer: Self-pay | Admitting: Surgery

## 2021-02-12 ENCOUNTER — Encounter: Payer: Self-pay | Admitting: *Deleted

## 2021-02-12 ENCOUNTER — Telehealth: Payer: Self-pay | Admitting: *Deleted

## 2021-02-12 DIAGNOSIS — C50911 Malignant neoplasm of unspecified site of right female breast: Secondary | ICD-10-CM | POA: Diagnosis not present

## 2021-02-12 NOTE — H&P (Signed)
History of Present Illness: Catherine [name] is a 66 y.o. female who is seen today as an office consultation at the request of Dr. Dema Severin for evaluation of Breast Cancer .   This is a 66 year old female who recently underwent routine screening mammogram in the [facility].  She was found to have a mass in the right lower outer quadrant at 8:00 located 9 cm from the nipple measuring 12 mm in diameter.  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 Her 2 status is pending.  No family history of breast cancer in first degree relatives.    She has seen Dr. Lindi Adie of Oncology who has recommended breast conserving therapy.  The patient is temporarily on Letrozole until surgery.  She is accompanied by her husband and a close friend who is a Marine scientist.     Review of Systems: A complete review of systems was obtained from the patient.  I have reviewed this information and discussed as appropriate with the patient.  See HPI as well for other ROS.  Review of Systems  Constitutional: Negative.   HENT: Negative.   Eyes: Negative.   Respiratory: Negative.   Cardiovascular: Negative.   Gastrointestinal: Negative.   Genitourinary: Negative.   Musculoskeletal: Negative.   Skin: Negative.   Neurological: Negative.   Endo/Heme/Allergies: Negative.   Psychiatric/Behavioral: Negative.       Medical History: Past Medical History:  Diagnosis Date   Anxiety    History of cancer    Hypertension    Thyroid disease     Patient Active Problem List  Diagnosis   Splenic mass   Invasive ductal carcinoma of breast, female, right (CMS-HCC)   PSH Hysterectomy Kidney stones  Allergies  Allergen Reactions   Penicillins Hives, Rash and Other (See Comments)    "this was a long time ago. I think it would be ok to take"     Current Outpatient Medications on File Prior to Visit  Medication Sig Dispense Refill   ergocalciferol, vitamin D2, 1,250 mcg (50,000 unit)  capsule 1 capsule     escitalopram oxalate (LEXAPRO) 5 MG tablet      letrozole (FEMARA) 2.5 mg tablet      metoprolol tartrate (LOPRESSOR) 25 MG tablet TAKE 1/2 TABLET BY MOUTH WITH FOOD TWICE DAILY     rosuvastatin (CRESTOR) 5 MG tablet Take 5 mg by mouth once daily     SYNTHROID 50 mcg tablet Take 50 mcg by mouth every morning before breakfast (0630) ON AN EMPTY STOMACH     No current facility-administered medications on file prior to visit.    Family History  Problem Relation Age of Onset   High blood pressure (Hypertension) Father      Social History   Tobacco Use  Smoking Status Never  Smokeless Tobacco Never     Social History   Socioeconomic History   Marital status: Married  Tobacco Use   Smoking status: Never   Smokeless tobacco: Never  Vaping Use   Vaping Use: Never used  Substance and Sexual Activity   Alcohol use: Not Currently   Drug use: Defer   Sexual activity: Defer    Objective:    Vitals:   02/12/21 1109  BP: (!) 146/86  Pulse: 76  SpO2: 98%  Weight: 85.1 kg (187 lb 9.6 oz)  Height: 157.5 cm (5\' 2" )    Body mass index is 34.31 kg/m.  Physical Exam   Constitutional:  WDWN in NAD,  conversant, no obvious deformities; lying in bed comfortably Eyes:  Pupils equal, round; sclera anicteric; moist conjunctiva; no lid lag HENT:  Oral mucosa moist; good dentition  Neck:  No masses palpated, trachea midline; no thyromegaly Lungs:  CTA bilaterally; normal respiratory effort Breasts:  symmetric, no nipple changes; no palpable masses or lymphadenopathy on either side; mild tenderness in right lower outer breast at site of biopsy CV:  Regular rate and rhythm; no murmurs; extremities well-perfused with no edema Abd:  +bowel sounds, soft, non-tender, no palpable organomegaly; no palpable hernias Musc:  Unable to assess gait; no apparent clubbing or cyanosis in extremities Lymphatic:  No palpable cervical or axillary lymphadenopathy Skin:  Warm, dry; no  sign of jaundice Psychiatric - alert and oriented x 4; calm mood and affect   Labs, Imaging and Diagnostic Testing: As described above.  Assessment and Plan:  Diagnoses and all orders for this visit:  Invasive ductal carcinoma of breast, female, right (CMS-HCC)     Right radioactive seed localized lumpectomy and right sentinel lymph node biopsy.The surgical procedure has been discussed with the patient.  Potential risks, benefits, alternative treatments, and expected outcomes have been explained.  All of the patient's questions at this time have been answered.  The likelihood of reaching the patient's treatment goal is good.  The patient understand the proposed surgical procedure and wishes to proceed.    Marci Polito Jearld Adjutant, MD  02/12/2021 7:08 PM

## 2021-02-12 NOTE — H&P (View-Only) (Signed)
History of Present Illness: Catherine [name] is a 66 y.o. female who is seen today as an office consultation at the request of Dr. Dema Severin for evaluation of Breast Cancer .   This is a 66 year old female who recently underwent routine screening mammogram in the [facility].  She was found to have a mass in the right lower outer quadrant at 8:00 located 9 cm from the nipple measuring 12 mm in diameter.  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 Her 2 status is pending.  No family history of breast cancer in first degree relatives.    She has seen Dr. Lindi Adie of Oncology who has recommended breast conserving therapy.  The patient is temporarily on Letrozole until surgery.  She is accompanied by her husband and a close friend who is a Marine scientist.     Review of Systems: A complete review of systems was obtained from the patient.  I have reviewed this information and discussed as appropriate with the patient.  See HPI as well for other ROS.  Review of Systems  Constitutional: Negative.   HENT: Negative.   Eyes: Negative.   Respiratory: Negative.   Cardiovascular: Negative.   Gastrointestinal: Negative.   Genitourinary: Negative.   Musculoskeletal: Negative.   Skin: Negative.   Neurological: Negative.   Endo/Heme/Allergies: Negative.   Psychiatric/Behavioral: Negative.       Medical History: Past Medical History:  Diagnosis Date   Anxiety    History of cancer    Hypertension    Thyroid disease     Patient Active Problem List  Diagnosis   Splenic mass   Invasive ductal carcinoma of breast, female, right (CMS-HCC)   PSH Hysterectomy Kidney stones  Allergies  Allergen Reactions   Penicillins Hives, Rash and Other (See Comments)    "this was a long time ago. I think it would be ok to take"     Current Outpatient Medications on File Prior to Visit  Medication Sig Dispense Refill   ergocalciferol, vitamin D2, 1,250 mcg (50,000 unit)  capsule 1 capsule     escitalopram oxalate (LEXAPRO) 5 MG tablet      letrozole (FEMARA) 2.5 mg tablet      metoprolol tartrate (LOPRESSOR) 25 MG tablet TAKE 1/2 TABLET BY MOUTH WITH FOOD TWICE DAILY     rosuvastatin (CRESTOR) 5 MG tablet Take 5 mg by mouth once daily     SYNTHROID 50 mcg tablet Take 50 mcg by mouth every morning before breakfast (0630) ON AN EMPTY STOMACH     No current facility-administered medications on file prior to visit.    Family History  Problem Relation Age of Onset   High blood pressure (Hypertension) Father      Social History   Tobacco Use  Smoking Status Never  Smokeless Tobacco Never     Social History   Socioeconomic History   Marital status: Married  Tobacco Use   Smoking status: Never   Smokeless tobacco: Never  Vaping Use   Vaping Use: Never used  Substance and Sexual Activity   Alcohol use: Not Currently   Drug use: Defer   Sexual activity: Defer    Objective:    Vitals:   02/12/21 1109  BP: (!) 146/86  Pulse: 76  SpO2: 98%  Weight: 85.1 kg (187 lb 9.6 oz)  Height: 157.5 cm (5\' 2" )    Body mass index is 34.31 kg/m.  Physical Exam   Constitutional:  WDWN in NAD,  conversant, no obvious deformities; lying in bed comfortably Eyes:  Pupils equal, round; sclera anicteric; moist conjunctiva; no lid lag HENT:  Oral mucosa moist; good dentition  Neck:  No masses palpated, trachea midline; no thyromegaly Lungs:  CTA bilaterally; normal respiratory effort Breasts:  symmetric, no nipple changes; no palpable masses or lymphadenopathy on either side; mild tenderness in right lower outer breast at site of biopsy CV:  Regular rate and rhythm; no murmurs; extremities well-perfused with no edema Abd:  +bowel sounds, soft, non-tender, no palpable organomegaly; no palpable hernias Musc:  Unable to assess gait; no apparent clubbing or cyanosis in extremities Lymphatic:  No palpable cervical or axillary lymphadenopathy Skin:  Warm, dry; no  sign of jaundice Psychiatric - alert and oriented x 4; calm mood and affect   Labs, Imaging and Diagnostic Testing: As described above.  Assessment and Plan:  Diagnoses and all orders for this visit:  Invasive ductal carcinoma of breast, female, right (CMS-HCC)     Right radioactive seed localized lumpectomy and right sentinel lymph node biopsy.The surgical procedure has been discussed with the patient.  Potential risks, benefits, alternative treatments, and expected outcomes have been explained.  All of the patient's questions at this time have been answered.  The likelihood of reaching the patient's treatment goal is good.  The patient understand the proposed surgical procedure and wishes to proceed.    Kristoffer Bala Jearld Adjutant, MD  02/12/2021 7:08 PM

## 2021-02-12 NOTE — Telephone Encounter (Signed)
Called pt to provide navigation resources and contact information. No answer and unable to leave vm d/t mailbox full.

## 2021-02-15 ENCOUNTER — Encounter: Payer: Self-pay | Admitting: *Deleted

## 2021-02-15 ENCOUNTER — Other Ambulatory Visit: Payer: Self-pay | Admitting: Surgery

## 2021-02-15 DIAGNOSIS — C50911 Malignant neoplasm of unspecified site of right female breast: Secondary | ICD-10-CM

## 2021-02-18 ENCOUNTER — Encounter: Payer: Self-pay | Admitting: *Deleted

## 2021-02-18 ENCOUNTER — Telehealth: Payer: Self-pay | Admitting: *Deleted

## 2021-02-18 NOTE — Telephone Encounter (Signed)
Received order for oncotype testing. Requisition sent to pathology 

## 2021-02-19 LAB — SURGICAL PATHOLOGY

## 2021-02-24 ENCOUNTER — Other Ambulatory Visit (HOSPITAL_COMMUNITY): Payer: Medicare Other

## 2021-02-24 DIAGNOSIS — D49511 Neoplasm of unspecified behavior of right kidney: Secondary | ICD-10-CM | POA: Diagnosis not present

## 2021-02-24 NOTE — Pre-Procedure Instructions (Signed)
Surgical Instructions    Your procedure is scheduled on Tuesday 03/02/21.   Report to [facility] Main Entrance "A" at 11:00 A.M., then check in with the Admitting office.  Call this number if you have problems the morning of surgery:  [phone]   If you have any questions prior to your surgery date call [phone]: Open Monday-Friday 8am-4pm    Remember:  Do not eat after midnight the night before your surgery  You may drink clear liquids until 10:00 A.M. the morning of your surgery.   Clear liquids allowed are: Water, Non-Citrus Juices (without pulp), Carbonated Beverages, Clear Tea, Black Coffee ONLY (NO MILK, CREAM OR POWDERED CREAMER of any kind), and Gatorade    Take these medicines the morning of surgery with A SIP OF WATER:   levothyroxine (SYNTHROID)   metoprolol tartrate (LOPRESSOR)   rosuvastatin (CRESTOR)   letrozole [facility])    Take these medicines if needed:   acetaminophen (TYLENOL)  escitalopram (LEXAPRO)   As of today, STOP taking any Aspirin (unless otherwise instructed by your surgeon) Aleve, Naproxen, Ibuprofen, Motrin, Advil, Goody's, BC's, all herbal medications, fish oil, and all vitamins.           Do not wear jewelry or makeup Do not wear lotions, powders, perfumes/colognes, or deodorant. Do not shave 48 hours prior to surgery.  Men may shave face and neck. Do not bring valuables to the hospital. Do not wear nail polish, gel polish, artificial nails, or any other type of covering on natural nails (fingers and toes) If you have artificial nails or gel coating that need to be removed by a nail salon, please have this removed prior to surgery. Artificial nails or gel coating may interfere with anesthesia's ability to adequately monitor your vital signs.  [facility] is not responsible for any belongings or valuables. .   Do NOT Smoke (Tobacco/Vaping)  24 hours prior to your procedure  If you use a CPAP at night, you may bring your mask for your  overnight stay.   Contacts, glasses, hearing aids, dentures or partials may not be worn into surgery, please bring cases for these belongings   For patients admitted to the hospital, discharge time will be determined by your treatment team.   Patients discharged the day of surgery will not be allowed to drive home, and someone needs to stay with them for 24 hours.  NO VISITORS WILL BE ALLOWED IN PRE-OP WHERE PATIENTS ARE PREPPED FOR SURGERY.  ONLY 1 SUPPORT PERSON MAY BE PRESENT IN THE WAITING ROOM WHILE YOU ARE IN SURGERY.  IF YOU ARE TO BE ADMITTED, ONCE YOU ARE IN YOUR ROOM YOU WILL BE ALLOWED TWO (2) VISITORS. 1 (ONE) VISITOR MAY STAY OVERNIGHT BUT MUST ARRIVE TO THE ROOM BY 8pm.  Minor children may have two parents present. Special consideration for safety and communication needs will be reviewed on a case by case basis.  Special instructions:    Oral Hygiene is also important to reduce your risk of infection.  Remember - BRUSH YOUR TEETH THE MORNING OF SURGERY WITH YOUR REGULAR TOOTHPASTE   [facility]- Preparing For Surgery  Before surgery, you can play an important role. Because skin is not sterile, your skin needs to be as free of germs as possible. You can reduce the number of germs on your skin by washing with CHG (chlorahexidine gluconate) Soap before surgery.  CHG is an antiseptic cleaner which kills germs and bonds with the skin to continue killing germs even  after washing.     Please do not use if you have an allergy to CHG or antibacterial soaps. If your skin becomes reddened/irritated stop using the CHG.  Do not shave (including legs and underarms) for at least 48 hours prior to first CHG shower. It is OK to shave your face.  Please follow these instructions carefully.     Shower the NIGHT BEFORE SURGERY and the MORNING OF SURGERY with CHG Soap.   If you chose to wash your hair, wash your hair first as usual with your normal shampoo. After you shampoo, rinse your hair and  body thoroughly to remove the shampoo.  Then [employer] and genitals (private parts) with your normal soap and rinse thoroughly to remove soap.  After that Use CHG Soap as you would any other liquid soap. You can apply CHG directly to the skin and wash gently with a scrungie or a clean washcloth.   Apply the CHG Soap to your body ONLY FROM THE NECK DOWN.  Do not use on open wounds or open sores. Avoid contact with your eyes, ears, mouth and genitals (private parts). Wash Face and genitals (private parts)  with your normal soap.   Wash thoroughly, paying special attention to the area where your surgery will be performed.  Thoroughly rinse your body with warm water from the neck down.  DO NOT shower/wash with your normal soap after using and rinsing off the CHG Soap.  Pat yourself dry with a CLEAN TOWEL.  Wear CLEAN PAJAMAS to bed the night before surgery  Place CLEAN SHEETS on your bed the night before your surgery  DO NOT SLEEP WITH PETS.   Day of Surgery:  Take a shower with CHG soap. Wear Clean/Comfortable clothing the morning of surgery Do not apply any deodorants/lotions.   Remember to brush your teeth WITH YOUR REGULAR TOOTHPASTE.    COVID testing  If you are going to stay overnight or be admitted after your procedure/surgery and require a pre-op COVID test, please follow these instructions after your COVID test   You are not required to quarantine however you are required to wear a well-fitting mask when you are out and around people not in your household.  If your mask becomes wet or soiled, replace with a new one.  Wash your hands often with soap and water for 20 seconds or clean your hands with an alcohol-based hand sanitizer that contains at least 60% alcohol.  Do not share personal items.  Notify your provider: if you are in close contact with someone who has COVID  or if you develop a fever of 100.4 or greater, sneezing, cough, sore throat, shortness of breath or  body aches.    Please read over the following fact sheets that you were given.

## 2021-02-25 ENCOUNTER — Encounter (HOSPITAL_COMMUNITY)
Admission: RE | Admit: 2021-02-25 | Discharge: 2021-02-25 | Disposition: A | Discharge status: Home / Self Care | Payer: Medicare PPO | Source: Ambulatory Visit | Attending: Surgery | Admitting: Surgery

## 2021-02-25 ENCOUNTER — Encounter (HOSPITAL_COMMUNITY): Payer: Self-pay

## 2021-02-25 ENCOUNTER — Other Ambulatory Visit: Payer: Self-pay

## 2021-02-25 VITALS — BP 138/79 | HR 64 | Temp 98.4°F | Resp 17 | Ht 63.0 in | Wt 187.9 lb

## 2021-02-25 DIAGNOSIS — I10 Essential (primary) hypertension: Secondary | ICD-10-CM | POA: Diagnosis not present

## 2021-02-25 DIAGNOSIS — Z01818 Encounter for other preprocedural examination: Secondary | ICD-10-CM | POA: Diagnosis not present

## 2021-02-25 HISTORY — DX: Malignant (primary) neoplasm, unspecified: C80.1

## 2021-02-25 HISTORY — DX: Anxiety disorder, unspecified: F41.9

## 2021-02-25 HISTORY — DX: Pure hypercholesterolemia, unspecified: E78.00

## 2021-02-25 HISTORY — DX: Hypothyroidism, unspecified: E03.9

## 2021-02-25 LAB — CBC
HCT: 41.6 % (ref 36.0–46.0)
Hemoglobin: 12.9 g/dL (ref 12.0–15.0)
MCH: 27.3 pg (ref 26.0–34.0)
MCHC: 31 g/dL (ref 30.0–36.0)
MCV: 87.9 fL (ref 80.0–100.0)
Platelets: 201 10*3/uL (ref 150–400)
RBC: 4.73 MIL/uL (ref 3.87–5.11)
RDW: 13.5 % (ref 11.5–15.5)
WBC: 4.5 10*3/uL (ref 4.0–10.5)
nRBC: 0 % (ref 0.0–0.2)

## 2021-02-25 LAB — BASIC METABOLIC PANEL
Anion gap: 7 (ref 5–15)
BUN: 10 mg/dL (ref 8–23)
CO2: 27 mmol/L (ref 22–32)
Calcium: 8.9 mg/dL (ref 8.9–10.3)
Chloride: 103 mmol/L (ref 98–111)
Creatinine, Ser: 0.95 mg/dL (ref 0.44–1.00)
GFR, Estimated: >60 mL/min (ref >60)
Glucose, Bld: 88 mg/dL (ref 70–99)
Potassium: 3.9 mmol/L (ref 3.5–5.1)
Sodium: 137 mmol/L (ref 135–145)

## 2021-02-25 NOTE — Progress Notes (Signed)
PCP - Dr. Harlan Stains Cardiologist - patient denies  PPM/ICD - n/a Device Orders -  Rep Notified -   Chest x-ray - n/a EKG - 02/25/21 Stress Test - patient denies ECHO - patient denies Cardiac Cath - patient denies  Sleep Study - patient denies, negative stop bang CPAP -   Fasting Blood Sugar - n/a Checks Blood Sugar _____ times a day  Blood Thinner Instructions: n/a Aspirin Instructions:n/a  ERAS Protcol - clears until 10:00am PRE-SURGERY Ensure or G2-   COVID TEST- ambulatory surgery   Anesthesia review:  n/a  Patient denies shortness of breath, fever, cough and chest pain at PAT appointment   All instructions explained to the patient, with a verbal understanding of the material. Patient agrees to go over the instructions while at home for a better understanding. Patient also instructed to self quarantine after being tested for COVID-19. The opportunity to ask questions was provided.

## 2021-03-01 ENCOUNTER — Ambulatory Visit
Admission: RE | Admit: 2021-03-01 | Discharge: 2021-03-01 | Disposition: A | Discharge status: Home / Self Care | Payer: Medicare PPO | Source: Ambulatory Visit | Attending: Surgery | Admitting: Surgery

## 2021-03-01 DIAGNOSIS — C50911 Malignant neoplasm of unspecified site of right female breast: Secondary | ICD-10-CM

## 2021-03-02 ENCOUNTER — Ambulatory Visit (HOSPITAL_COMMUNITY)
Admission: RE | Admit: 2021-03-02 | Discharge: 2021-03-02 | Disposition: A | Discharge status: Home / Self Care | Payer: Medicare PPO | Attending: Surgery | Admitting: Surgery

## 2021-03-02 ENCOUNTER — Ambulatory Visit (HOSPITAL_COMMUNITY)
Admission: RE | Admit: 2021-03-02 | Discharge: 2021-03-02 | Disposition: A | Discharge status: Home / Self Care | Payer: Medicare PPO | Source: Ambulatory Visit | Attending: Surgery | Admitting: Surgery

## 2021-03-02 ENCOUNTER — Other Ambulatory Visit: Payer: Self-pay

## 2021-03-02 ENCOUNTER — Encounter (HOSPITAL_COMMUNITY): Payer: Self-pay | Admitting: Surgery

## 2021-03-02 ENCOUNTER — Encounter (HOSPITAL_COMMUNITY)
Admission: RE | Disposition: A | Discharge status: Home / Self Care | Payer: Self-pay | Source: Home / Self Care | Attending: Surgery

## 2021-03-02 ENCOUNTER — Ambulatory Visit (HOSPITAL_COMMUNITY): Payer: Medicare PPO | Admitting: Physician Assistant

## 2021-03-02 ENCOUNTER — Ambulatory Visit (HOSPITAL_BASED_OUTPATIENT_CLINIC_OR_DEPARTMENT_OTHER): Payer: Medicare PPO | Admitting: Anesthesiology

## 2021-03-02 ENCOUNTER — Ambulatory Visit
Admission: RE | Admit: 2021-03-02 | Discharge: 2021-03-02 | Disposition: A | Discharge status: Home / Self Care | Payer: Medicare Other | Source: Ambulatory Visit | Attending: Surgery | Admitting: Surgery

## 2021-03-02 DIAGNOSIS — C50911 Malignant neoplasm of unspecified site of right female breast: Secondary | ICD-10-CM

## 2021-03-02 DIAGNOSIS — R928 Other abnormal and inconclusive findings on diagnostic imaging of breast: Secondary | ICD-10-CM | POA: Diagnosis not present

## 2021-03-02 DIAGNOSIS — C773 Secondary and unspecified malignant neoplasm of axilla and upper limb lymph nodes: Secondary | ICD-10-CM | POA: Diagnosis not present

## 2021-03-02 DIAGNOSIS — I10 Essential (primary) hypertension: Secondary | ICD-10-CM | POA: Diagnosis not present

## 2021-03-02 DIAGNOSIS — E039 Hypothyroidism, unspecified: Secondary | ICD-10-CM | POA: Insufficient documentation

## 2021-03-02 DIAGNOSIS — D0511 Intraductal carcinoma in situ of right breast: Secondary | ICD-10-CM | POA: Insufficient documentation

## 2021-03-02 DIAGNOSIS — Z170 Estrogen receptor positive status [ER+]: Secondary | ICD-10-CM | POA: Diagnosis not present

## 2021-03-02 DIAGNOSIS — G8918 Other acute postprocedural pain: Secondary | ICD-10-CM | POA: Diagnosis not present

## 2021-03-02 DIAGNOSIS — F419 Anxiety disorder, unspecified: Secondary | ICD-10-CM | POA: Diagnosis not present

## 2021-03-02 DIAGNOSIS — C50511 Malignant neoplasm of lower-outer quadrant of right female breast: Secondary | ICD-10-CM | POA: Diagnosis not present

## 2021-03-02 HISTORY — PX: BREAST LUMPECTOMY WITH RADIOACTIVE SEED AND SENTINEL LYMPH NODE BIOPSY: SHX6550

## 2021-03-02 HISTORY — PX: AXILLARY SENTINEL NODE BIOPSY: SHX5738

## 2021-03-02 SURGERY — BREAST LUMPECTOMY WITH RADIOACTIVE SEED AND SENTINEL LYMPH NODE BIOPSY
Anesthesia: General | Site: Breast | Laterality: Right

## 2021-03-02 MED ORDER — DEXAMETHASONE SODIUM PHOSPHATE 10 MG/ML IJ SOLN
INTRAMUSCULAR | Status: AC
  Filled 2021-03-02: qty 1

## 2021-03-02 MED ORDER — TECHNETIUM TC 99M TILMANOCEPT KIT
1.0000 | PACK | Freq: Once | INTRAVENOUS | Status: AC | PRN
  Administered 2021-03-02: 1 via INTRADERMAL

## 2021-03-02 MED ORDER — MIDAZOLAM HCL 2 MG/2ML IJ SOLN
INTRAMUSCULAR | Status: DC | PRN
  Administered 2021-03-02: .5 mg via INTRAVENOUS

## 2021-03-02 MED ORDER — FENTANYL CITRATE (PF) 100 MCG/2ML IJ SOLN: 50.0000 ug | Freq: Once | INTRAMUSCULAR | Status: AC

## 2021-03-02 MED ORDER — FENTANYL CITRATE (PF) 100 MCG/2ML IJ SOLN
INTRAMUSCULAR | Status: AC
  Administered 2021-03-02: 50 ug via INTRAVENOUS
  Filled 2021-03-02: qty 2

## 2021-03-02 MED ORDER — CHLORHEXIDINE GLUCONATE CLOTH 2 % EX PADS
6.0000 | MEDICATED_PAD | Freq: Once | CUTANEOUS | Status: DC
Start: 2021-03-02 — End: 2021-03-02

## 2021-03-02 MED ORDER — LIDOCAINE 2% (20 MG/ML) 5 ML SYRINGE
INTRAMUSCULAR | Status: AC
  Filled 2021-03-02: qty 5

## 2021-03-02 MED ORDER — PHENYLEPHRINE HCL-NACL 20-0.9 MG/250ML-% IV SOLN
INTRAVENOUS | Status: DC | PRN
  Administered 2021-03-02: 65 ug/min via INTRAVENOUS

## 2021-03-02 MED ORDER — DEXAMETHASONE SODIUM PHOSPHATE 10 MG/ML IJ SOLN
INTRAMUSCULAR | Status: DC | PRN
  Administered 2021-03-02: 4 mg via INTRAVENOUS

## 2021-03-02 MED ORDER — BUPIVACAINE-EPINEPHRINE (PF) 0.25% -1:200000 IJ SOLN
INTRAMUSCULAR | Status: AC
  Filled 2021-03-02: qty 30

## 2021-03-02 MED ORDER — ONDANSETRON HCL 4 MG/2ML IJ SOLN
INTRAMUSCULAR | Status: AC
  Filled 2021-03-02: qty 2

## 2021-03-02 MED ORDER — ONDANSETRON HCL 4 MG/2ML IJ SOLN: 4.0000 mg | Freq: Four times a day (QID) | INTRAMUSCULAR | Status: DC | PRN

## 2021-03-02 MED ORDER — LACTATED RINGERS IV SOLN
INTRAVENOUS | Status: DC | PRN
Start: 2021-03-02 — End: 2021-03-02

## 2021-03-02 MED ORDER — GLYCOPYRROLATE PF 0.2 MG/ML IJ SOSY
PREFILLED_SYRINGE | INTRAMUSCULAR | Status: AC
  Filled 2021-03-02: qty 1

## 2021-03-02 MED ORDER — BUPIVACAINE-EPINEPHRINE 0.25% -1:200000 IJ SOLN
INTRAMUSCULAR | Status: DC | PRN
  Administered 2021-03-02: 12 mL

## 2021-03-02 MED ORDER — MIDAZOLAM HCL 2 MG/2ML IJ SOLN: 1.0000 mg | Freq: Once | INTRAMUSCULAR | Status: AC

## 2021-03-02 MED ORDER — LACTATED RINGERS IV SOLN: INTRAVENOUS | Status: DC

## 2021-03-02 MED ORDER — CEFAZOLIN SODIUM-DEXTROSE 2-4 GM/100ML-% IV SOLN
2.0000 g | INTRAVENOUS | Status: AC
  Administered 2021-03-02: 2 g via INTRAVENOUS
  Filled 2021-03-02: qty 100

## 2021-03-02 MED ORDER — HYDROCODONE-ACETAMINOPHEN 5-325 MG PO TABS: 1.0000 | ORAL_TABLET | Freq: Four times a day (QID) | ORAL | 0 refills | Status: DC | PRN

## 2021-03-02 MED ORDER — FENTANYL CITRATE (PF) 250 MCG/5ML IJ SOLN
INTRAMUSCULAR | Status: DC | PRN
Start: 2021-03-02 — End: 2021-03-02
  Administered 2021-03-02 (×2): 25 ug via INTRAVENOUS

## 2021-03-02 MED ORDER — PROPOFOL 10 MG/ML IV BOLUS
INTRAVENOUS | Status: DC | PRN
  Administered 2021-03-02: 40 mg via INTRAVENOUS
  Administered 2021-03-02: 150 mg via INTRAVENOUS
  Administered 2021-03-02: 10 mg via INTRAVENOUS

## 2021-03-02 MED ORDER — MIDAZOLAM HCL 2 MG/2ML IJ SOLN
INTRAMUSCULAR | Status: AC
  Filled 2021-03-02: qty 2

## 2021-03-02 MED ORDER — PROPOFOL 10 MG/ML IV BOLUS
INTRAVENOUS | Status: AC
  Filled 2021-03-02: qty 20

## 2021-03-02 MED ORDER — FENTANYL CITRATE (PF) 100 MCG/2ML IJ SOLN: 25.0000 ug | INTRAMUSCULAR | Status: DC | PRN

## 2021-03-02 MED ORDER — ACETAMINOPHEN 500 MG PO TABS: 1000.0000 mg | ORAL_TABLET | ORAL | Status: DC

## 2021-03-02 MED ORDER — ONDANSETRON HCL 4 MG/2ML IJ SOLN
4.0000 mg | Freq: Once | INTRAMUSCULAR | Status: AC
  Administered 2021-03-02: 4 mg via INTRAVENOUS

## 2021-03-02 MED ORDER — OXYCODONE HCL 5 MG PO TABS: 5.0000 mg | ORAL_TABLET | Freq: Once | ORAL | Status: DC | PRN

## 2021-03-02 MED ORDER — FENTANYL CITRATE (PF) 250 MCG/5ML IJ SOLN
INTRAMUSCULAR | Status: AC
  Filled 2021-03-02: qty 5

## 2021-03-02 MED ORDER — LIDOCAINE 2% (20 MG/ML) 5 ML SYRINGE
INTRAMUSCULAR | Status: DC | PRN
Start: 2021-03-02 — End: 2021-03-02
  Administered 2021-03-02: 40 mg via INTRAVENOUS

## 2021-03-02 MED ORDER — ROPIVACAINE HCL 5 MG/ML IJ SOLN
INTRAMUSCULAR | Status: DC | PRN
  Administered 2021-03-02: 20 mL via PERINEURAL

## 2021-03-02 MED ORDER — ORAL CARE MOUTH RINSE: 15.0000 mL | Freq: Once | OROMUCOSAL | Status: AC

## 2021-03-02 MED ORDER — MIDAZOLAM HCL 2 MG/2ML IJ SOLN
INTRAMUSCULAR | Status: AC
Start: 2021-03-02 — End: 2021-03-02
  Administered 2021-03-02: 1 mg via INTRAVENOUS
  Filled 2021-03-02: qty 2

## 2021-03-02 MED ORDER — OXYCODONE HCL 5 MG/5ML PO SOLN: 5.0000 mg | Freq: Once | ORAL | Status: DC | PRN

## 2021-03-02 MED ORDER — GLYCOPYRROLATE PF 0.2 MG/ML IJ SOSY
PREFILLED_SYRINGE | INTRAMUSCULAR | Status: DC | PRN
Start: 2021-03-02 — End: 2021-03-02
  Administered 2021-03-02: .2 mg via INTRAVENOUS

## 2021-03-02 MED ORDER — 0.9 % SODIUM CHLORIDE (POUR BTL) OPTIME
TOPICAL | Status: DC | PRN
  Administered 2021-03-02: 1000 mL

## 2021-03-02 MED ORDER — MAGTRACE LYMPHATIC TRACER
INTRAMUSCULAR | Status: DC | PRN
  Administered 2021-03-02: 2 mL via INTRAMUSCULAR

## 2021-03-02 MED ORDER — CHLORHEXIDINE GLUCONATE 0.12 % MT SOLN
15.0000 mL | Freq: Once | OROMUCOSAL | Status: AC
  Administered 2021-03-02: 15 mL via OROMUCOSAL
  Filled 2021-03-02: qty 15

## 2021-03-02 SURGICAL SUPPLY — 47 items
APPLIER CLIP 9.375 MED OPEN (MISCELLANEOUS) ×3
BENZOIN TINCTURE PRP APPL 2/3 (GAUZE/BANDAGES/DRESSINGS) ×4 IMPLANT
BINDER BREAST LRG (GAUZE/BANDAGES/DRESSINGS) IMPLANT
BINDER BREAST XLRG (GAUZE/BANDAGES/DRESSINGS) IMPLANT
BINDER BREAST XXLRG (GAUZE/BANDAGES/DRESSINGS) ×1 IMPLANT
CANISTER SUCT 3000ML PPV (MISCELLANEOUS) ×3 IMPLANT
CHLORAPREP W/TINT 26 (MISCELLANEOUS) ×3 IMPLANT
CLIP APPLIE 9.375 MED OPEN (MISCELLANEOUS) ×2 IMPLANT
CLSR STERI-STRIP ANTIMIC 1/2X4 (GAUZE/BANDAGES/DRESSINGS) ×2 IMPLANT
CNTNR URN SCR LID CUP LEK RST (MISCELLANEOUS) ×2 IMPLANT
CONT SPEC 4OZ STRL OR WHT (MISCELLANEOUS) ×1
COVER PROBE W GEL 5X96 (DRAPES) ×3 IMPLANT
COVER SURGICAL LIGHT HANDLE (MISCELLANEOUS) ×3 IMPLANT
DEVICE DUBIN SPECIMEN MAMMOGRA (MISCELLANEOUS) ×3 IMPLANT
DRAPE CHEST BREAST 15X10 FENES (DRAPES) ×3 IMPLANT
DRSG PAD ABDOMINAL 8X10 ST (GAUZE/BANDAGES/DRESSINGS) ×2 IMPLANT
DRSG TEGADERM 4X4.75 (GAUZE/BANDAGES/DRESSINGS) ×6 IMPLANT
ELECT CAUTERY BLADE 6.4 (BLADE) ×3 IMPLANT
ELECT REM PT RETURN 9FT ADLT (ELECTROSURGICAL) ×3
ELECTRODE REM PT RTRN 9FT ADLT (ELECTROSURGICAL) ×2 IMPLANT
GAUZE SPONGE 2X2 8PLY STRL LF (GAUZE/BANDAGES/DRESSINGS) ×2 IMPLANT
GLOVE SURG ENC MOIS LTX SZ7 (GLOVE) ×3 IMPLANT
GLOVE SURG UNDER POLY LF SZ7.5 (GLOVE) ×3 IMPLANT
GOWN STRL REUS W/ TWL LRG LVL3 (GOWN DISPOSABLE) ×4 IMPLANT
GOWN STRL REUS W/TWL LRG LVL3 (GOWN DISPOSABLE) ×2
KIT BASIN OR (CUSTOM PROCEDURE TRAY) ×3 IMPLANT
KIT MARKER MARGIN INK (KITS) ×1 IMPLANT
LIGHT WAVEGUIDE WIDE FLAT (MISCELLANEOUS) IMPLANT
NDL 18GX1X1/2 (RX/OR ONLY) (NEEDLE) ×2 IMPLANT
NDL FILTER BLUNT 18X1 1/2 (NEEDLE) ×2 IMPLANT
NDL HYPO 25GX1X1/2 BEV (NEEDLE) ×4 IMPLANT
NEEDLE 18GX1X1/2 (RX/OR ONLY) (NEEDLE) ×3 IMPLANT
NEEDLE FILTER BLUNT 18X 1/2SAF (NEEDLE) ×1
NEEDLE FILTER BLUNT 18X1 1/2 (NEEDLE) ×2 IMPLANT
NEEDLE HYPO 25GX1X1/2 BEV (NEEDLE) ×6 IMPLANT
NS IRRIG 1000ML POUR BTL (IV SOLUTION) ×3 IMPLANT
PACK GENERAL/GYN (CUSTOM PROCEDURE TRAY) ×3 IMPLANT
SPONGE GAUZE 2X2 STER 10/PKG (GAUZE/BANDAGES/DRESSINGS) ×1
SPONGE T-LAP 4X18 ~~LOC~~+RFID (SPONGE) ×3 IMPLANT
STRIP CLOSURE SKIN 1/2X4 (GAUZE/BANDAGES/DRESSINGS) ×2 IMPLANT
SUT MNCRL AB 4-0 PS2 18 (SUTURE) ×5 IMPLANT
SUT VIC AB 3-0 SH 27 (SUTURE) ×2
SUT VIC AB 3-0 SH 27X BRD (SUTURE) ×4 IMPLANT
SYR CONTROL 10ML LL (SYRINGE) ×6 IMPLANT
TOWEL GREEN STERILE (TOWEL DISPOSABLE) ×3 IMPLANT
TOWEL GREEN STERILE FF (TOWEL DISPOSABLE) ×3 IMPLANT
TRACER MAGTRACE VIAL (MISCELLANEOUS) ×1 IMPLANT

## 2021-03-02 NOTE — Interval H&P Note (Signed)
History and Physical Interval Note:  03/02/2021 11:45 AM  Catherine [name]  has presented today for surgery, with the diagnosis of RIGHT BREAST INVASIVE DCIS.  The various methods of treatment have been discussed with the patient and family. After consideration of risks, benefits and other options for treatment, the patient has consented to  Procedure(s): RIGHT BREAST LUMPECTOMY WITH RADIOACTIVE SEED AND RIGHT AXILLARY SENTINEL LYMPH NODE BIOPSY (Right) as a surgical intervention.  The patient's history has been reviewed, patient examined, no change in status, stable for surgery.  I have reviewed the patient's chart and labs.  Questions were answered to the patient's satisfaction.     Maia Petties

## 2021-03-02 NOTE — Transfer of Care (Signed)
Immediate Anesthesia Transfer of Care Note  Patient: Catherine [name]  Procedure(s) Performed: RIGHT BREAST LUMPECTOMY WITH RADIOACTIVE SEED (Right: Breast) RIGHT AXILLARY SENTINEL NODE BIOPSY (Right: Axilla)  Patient Location: PACU  Anesthesia Type:General  Level of Consciousness: awake, drowsy, patient cooperative and responds to stimulation  Airway & Oxygen Therapy: Patient Spontanous Breathing and Patient connected to nasal cannula oxygen  Post-op Assessment: Report given to RN and Post -op Vital signs reviewed and stable  Post vital signs: Reviewed and stable  Last Vitals:  Vitals Value Taken Time  BP    Temp    Pulse    Resp    SpO2      Last Pain:  Vitals:   03/02/21 1206  TempSrc:   PainSc: 0-No pain         Complications: No notable events documented.

## 2021-03-02 NOTE — Anesthesia Procedure Notes (Signed)
Procedure Name: LMA Insertion Date/Time: 03/02/2021 12:55 PM Performed by: Cathren Harsh, CRNA Pre-anesthesia Checklist: Patient identified, Emergency Drugs available, Suction available and Patient being monitored Patient Re-evaluated:Patient Re-evaluated prior to induction Oxygen Delivery Method: Circle System Utilized Preoxygenation: Pre-oxygenation with 100% oxygen Induction Type: IV induction Ventilation: Mask ventilation without difficulty LMA: LMA inserted LMA Size: 4.0 Number of attempts: 1 Airway Equipment and Method: Bite block Placement Confirmation: positive ETCO2 Tube secured with: Tape Dental Injury: Teeth and Oropharynx as per pre-operative assessment

## 2021-03-02 NOTE — Anesthesia Preprocedure Evaluation (Signed)
Anesthesia Evaluation  Patient identified by MRN, date of birth, ID band Patient awake    Reviewed: Allergy & Precautions, H&P , NPO status , Patient's Chart, lab work & pertinent test results  Airway Mallampati: II   Neck ROM: full    Dental   Pulmonary neg pulmonary ROS,    breath sounds clear to auscultation       Cardiovascular hypertension,  Rhythm:regular Rate:Normal     Neuro/Psych PSYCHIATRIC DISORDERS Anxiety    GI/Hepatic   Endo/Other  Hypothyroidism   Renal/GU stones     Musculoskeletal   Abdominal   Peds  Hematology   Anesthesia Other Findings   Reproductive/Obstetrics                             Anesthesia Physical Anesthesia Plan  ASA: 2  Anesthesia Plan: General   Post-op Pain Management: Regional block*   Induction: Intravenous  PONV Risk Score and Plan: 3 and Ondansetron, Dexamethasone, Midazolam and Treatment may vary due to age or medical condition  Airway Management Planned: LMA  Additional Equipment:   Intra-op Plan:   Post-operative Plan: Extubation in OR  Informed Consent: I have reviewed the patients History and Physical, chart, labs and discussed the procedure including the risks, benefits and alternatives for the proposed anesthesia with the patient or authorized representative who has indicated his/her understanding and acceptance.     Dental advisory given  Plan Discussed with: CRNA, Anesthesiologist and Surgeon  Anesthesia Plan Comments:         Anesthesia Quick Evaluation

## 2021-03-02 NOTE — Op Note (Addendum)
Pre-op Diagnosis:  Invasive ductal carcinoma right breast Post-op Diagnosis: same Procedure:  Right radioactive seed localized lumpectomy and sentinel lymph node biopsy  Surgeon:  Barron Vanloan K. Resident:  Yvone Neu, MD Anesthesia:  GEN - LMA/ PEC block Indications:  This is a 66 year old female who recently underwent routine screening mammogram in the [facility].  She was found to have a mass in the right lower outer quadrant at 8:00 located 9 cm from the nipple measuring 12 mm in diameter.  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 Her 2 status is pending.   No family history of breast cancer in first degree relatives.   Description of procedure:   Magtrace was injected in the pre-op area prior to surgery.  The patient is brought to the operating room placed in supine position on the operating room table. After an adequate level of general anesthesia was obtained, her right breast was prepped with ChloraPrep and draped in sterile fashion. A timeout was taken to ensure the proper patient and proper procedure. We interrogated the breast with the neoprobe. We made a transverse incision in the inframammary crease after infiltrating with 0.25% Marcaine. Dissection was carried down in the breast tissue with cautery. We used the neoprobe to guide [place] towards the radioactive seed. We excised an area of tissue around the radioactive seed 2 cm in diameter. The specimen was removed and was oriented with a paint kit. Specimen mammogram showed the radioactive seed as well as the biopsy clip within the specimen. This was sent for pathologic examination. There is no residual radioactivity within the biopsy cavity. Clips were placed in all five margins.  We inspected carefully for hemostasis. The wound was thoroughly irrigated.   We then turned our attention to the axilla.  The settings were adjusted on the Neoprobe and we interrogated the axilla.  An area of  activity was identified.  No activity was identified with the Sentimag probe.  I made a transverse incision over the area of radioactivity.  We dissected into the axilla and identified a radioactive lymph node.  This was dissected free and sent as "sentinel lymph node #1".  We interrogated the axilla again and a second node was identified.  This was sent as "sentinel lymph node #2."  There was minimal background activity.  The wounds were closed with a deep layer of 3-0 Vicryl and a subcuticular layer of 4-0 Monocryl. Benzoin Steri-Strips were applied. The patient was then extubated and brought to the recovery room in stable condition. All sponge, instrument, and needle counts are correct.  Imogene Burn. Georgette Dover, MD, [facility]  General/ Trauma Surgery  11/25/2019 1:38 PM

## 2021-03-02 NOTE — Discharge Instructions (Signed)
[facility] Office Phone Number [phone]  BREAST BIOPSY/ PARTIAL MASTECTOMY: POST OP INSTRUCTIONS  Always review your discharge instruction sheet given to you by the facility where your surgery was performed.  IF YOU HAVE DISABILITY OR FAMILY LEAVE FORMS, YOU MUST BRING THEM TO THE OFFICE FOR PROCESSING.  DO NOT GIVE THEM TO YOUR DOCTOR.  A prescription for pain medication may be given to you upon discharge.  Take your pain medication as prescribed, if needed.  If narcotic pain medicine is not needed, then you may take acetaminophen (Tylenol) or ibuprofen (Advil) as needed. Take your usually prescribed medications unless otherwise directed If you need a refill on your pain medication, please contact your pharmacy.  They will contact our office to request authorization.  Prescriptions will not be filled after 5pm or on week-ends. You should eat very light the first 24 hours after surgery, such as soup, crackers, pudding, etc.  Resume your normal diet the day after surgery. Most patients will experience some swelling and bruising in the breast.  Ice packs and a good support bra will help.  Swelling and bruising can take several days to resolve.  It is common to experience some constipation if taking pain medication after surgery.  Increasing fluid intake and taking a stool softener will usually help or prevent this problem from occurring.  A mild laxative (Milk of Magnesia or Miralax) should be taken according to package directions if there are no bowel movements after 48 hours. Unless discharge instructions indicate otherwise, you may remove your bandages 48 hours after surgery, and you may shower at that time.  You will have steri-strips (small skin tapes) in place directly over the incision.  These strips should be left on the skin for 7-10 days.   Any sutures or staples will be removed at the office during your follow-up visit. ACTIVITIES:  You may resume regular daily  activities (gradually increasing) beginning the next day.  Wearing a good support bra or sports bra minimizes pain and swelling.  You may have sexual intercourse when it is comfortable. You may drive when you no longer are taking prescription pain medication, you can comfortably wear a seatbelt, and you can safely maneuver your car and apply brakes. RETURN TO WORK:  1-2 weeks You should see your doctor in the office for a follow-up appointment approximately two weeks after your surgery.  Your doctor's nurse will typically make your follow-up appointment when she calls you with your pathology report.  Expect your pathology report 2-3 business days after your surgery.  You may call to check if you do not hear from us after three days. OTHER INSTRUCTIONS: _______________________________________________________________________________________________ _____________________________________________________________________________________________________________________________________ _____________________________________________________________________________________________________________________________________ _____________________________________________________________________________________________________________________________________  WHEN TO CALL YOUR DOCTOR: Fever over 101.0 Nausea and/or vomiting. Extreme swelling or bruising. Continued bleeding from incision. Increased pain, redness, or drainage from the incision.  The clinic staff is available to answer your questions during regular business hours.  Please don't hesitate to call and ask to speak to one of the nurses for clinical concerns.  If you have a medical emergency, go to the nearest emergency room or call 911.  A surgeon from [facility] is always on call at the hospital.  For further questions, please visit [email or web address]   

## 2021-03-02 NOTE — Anesthesia Procedure Notes (Signed)
Anesthesia Regional Block: Pectoralis block   Pre-Anesthetic Checklist: , timeout performed,  Correct Patient, Correct Site, Correct Laterality,  Correct Procedure, Correct Position, site marked,  Risks and benefits discussed,  Surgical consent,  Pre-op evaluation,  At surgeon's request and post-op pain management  Laterality: Right  Prep: chloraprep       Needles:  Injection technique: Single-shot  Needle Type: Echogenic Needle     Needle Length: 9cm  Needle Gauge: 21     Additional Needles:   Narrative:  Start time: 03/02/2021 12:20 PM End time: 03/02/2021 12:32 PM Injection made incrementally with aspirations every 5 mL.  Performed by: Personally  Anesthesiologist: Albertha Ghee, MD  Additional Notes: Pt tolerated the procedure well.

## 2021-03-03 ENCOUNTER — Encounter (HOSPITAL_COMMUNITY): Payer: Self-pay | Admitting: Surgery

## 2021-03-03 NOTE — Anesthesia Postprocedure Evaluation (Signed)
Anesthesia Post Note  Patient: Catherine [name]  Procedure(s) Performed: RIGHT BREAST LUMPECTOMY WITH RADIOACTIVE SEED (Right: Breast) RIGHT AXILLARY SENTINEL NODE BIOPSY (Right: Axilla)     Patient location during evaluation: PACU Anesthesia Type: General Level of consciousness: awake and alert Pain management: pain level controlled Vital Signs Assessment: post-procedure vital signs reviewed and stable Respiratory status: spontaneous breathing, nonlabored ventilation, respiratory function stable and patient connected to nasal cannula oxygen Cardiovascular status: blood pressure returned to baseline and stable Postop Assessment: no apparent nausea or vomiting Anesthetic complications: no   No notable events documented.  Last Vitals:  Vitals:   03/02/21 1455 03/02/21 1510  BP: (!) 143/80 (!) 148/81  Pulse: (!) 55 65  Resp: 14 15  Temp:  36.4 C  SpO2: 98% 97%    Last Pain:  Vitals:   03/02/21 1510  TempSrc:   PainSc: [place]

## 2021-03-04 ENCOUNTER — Telehealth: Payer: Self-pay | Admitting: *Deleted

## 2021-03-04 ENCOUNTER — Encounter: Payer: Self-pay | Admitting: *Deleted

## 2021-03-04 LAB — SURGICAL PATHOLOGY

## 2021-03-04 NOTE — Telephone Encounter (Signed)
Spoke to pathology, [facility] did not send block in order for oncotype testing to be sent out.  ?Paperwork was faxed to [facility] in order for them to send a block for oncotype testing. ?Per [facility] pathology. [facility] sent block out yesterday for testing. ?

## 2021-03-12 ENCOUNTER — Telehealth: Payer: Self-pay

## 2021-03-12 NOTE — Telephone Encounter (Signed)
Pt called requesting to speak with Bary Castilla, RN regarding oncotype results. Dawn is out of office currently. I called [facility] pathology and spoke with Cristie Hem who states there has been nothing sent off for this specimen. Advised pt there is a delay in results and told her I would inform Dawn so she can reach out next week. Pt verbalized thanks and understanding.  ?

## 2021-03-15 NOTE — Telephone Encounter (Signed)
Spoke with pt to let her know results should be available 3/22. Pt verbalized that she is very anxious about the results and what is to come. Empathized with pt and validated her feelings. She will call 3/22. ?

## 2021-03-18 ENCOUNTER — Encounter (HOSPITAL_COMMUNITY): Payer: Self-pay

## 2021-03-19 DIAGNOSIS — Z170 Estrogen receptor positive status [ER+]: Secondary | ICD-10-CM | POA: Diagnosis not present

## 2021-03-19 DIAGNOSIS — C50511 Malignant neoplasm of lower-outer quadrant of right female breast: Secondary | ICD-10-CM | POA: Diagnosis not present

## 2021-03-23 ENCOUNTER — Telehealth: Payer: Self-pay | Admitting: *Deleted

## 2021-03-23 ENCOUNTER — Encounter: Payer: Self-pay | Admitting: *Deleted

## 2021-03-23 NOTE — Telephone Encounter (Signed)
Received oncotype score of 28. Physician team notified. Called pt, scheduled and confirmed appt with Dr. Lindi Adie to discuss oncotype results and recommendations ?Scheduled for 3/22 at 3pm ?

## 2021-03-23 NOTE — Progress Notes (Signed)
? ?Patient Care Team: ?Harlan Stains, MD as PCP - General (Family Medicine) ?Mauro Kaufmann, RN as Oncology Nurse Navigator ?Rockwell Germany, RN as Oncology Nurse Navigator ? ?DIAGNOSIS:  ?Encounter Diagnosis  ?Name Primary?  ? Malignant neoplasm of lower-outer quadrant of right breast of female, estrogen receptor positive ([place])   ? ? ?SUMMARY OF ONCOLOGIC HISTORY: ?Oncology History  ?Malignant neoplasm of lower-outer quadrant of right breast of female, estrogen receptor positive ([place])  ?01/29/2021 Initial Diagnosis  ? Screening mammogram detected right breast mass 1.2 cm, axilla normal, biopsy revealed grade 2 IDC with DCIS ER 90%, PR 30%, HER2 2+ by IHC FISH negative ?  ?02/08/2021 Cancer Staging  ? Staging form: Breast, AJCC 8th Edition ?- Clinical stage from 02/08/2021: Stage IA (cT1c, cN0, cM0, G2, ER+, PR+, HER2: Equivocal) - Signed by Nicholas Lose, MD on 02/08/2021 ?Stage prefix: Initial diagnosis ?Histologic grading system: 3 grade system ? ?  ?03/02/2021 Surgery  ? Right lumpectomy: Grade 2 IDC 1.2 cm with intermediate grade DCIS, focal involvement of anterior medial margin junction, 1/4 lymph nodes positive ?  ?03/19/2021 Oncotype testing  ? Oncotype DX score: 28.  Distant recurrence at 10 years: 17% ?  ? ? ?CHIEF COMPLIANT: Newly diagnosed right breast cancer ? ?INTERVAL HISTORY: Catherine [name] is a 66 y.o. 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 Screening mammogram on 01/12/2021 showed right breast asymmetry. Diagnostic mammogram and [place] on 01/18/2021 showed a persistent irregular mass within the lower outer right breast. 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 She presents to the clinic today for follow-up ? ?ALLERGIES:  is allergic to penicillins. ? ?MEDICATIONS:  ?Current Outpatient Medications  ?Medication Sig Dispense Refill  ? acetaminophen (TYLENOL) 500  MG tablet Take 1,000 mg by mouth every 6 (six) hours as needed for moderate pain.    ? Calcium Magnesium Zinc 333-133-5 MG TABS Take 1 tablet by mouth daily. (Patient taking differently: Take 1 tablet by mouth once a week.)    ? diphenhydrAMINE (BENADRYL) 25 MG tablet Take 25 mg by mouth every 6 (six) hours as needed for allergies.    ? ergocalciferol (VITAMIN D2) 1.25 MG (50000 UT) capsule Take 1 capsule (50,000 Units total) by mouth once a week.    ? escitalopram (LEXAPRO) 5 MG tablet Take 5 mg by mouth daily as needed for anxiety.    ? HYDROcodone-acetaminophen (NORCO/VICODIN) 5-325 MG tablet Take 1 tablet by mouth every 6 (six) hours as needed for moderate pain. 15 tablet 0  ? letrozole (FEMARA) 2.5 MG tablet Take 1 tablet (2.5 mg total) by mouth daily. 90 tablet 3  ? levothyroxine (SYNTHROID) 50 MCG tablet Take 1 tablet (50 mcg total) by mouth daily before breakfast.    ? metoprolol tartrate (LOPRESSOR) 25 MG tablet Take 12.5 mg by mouth 2 (two) times daily.    ? rosuvastatin (CRESTOR) 5 MG tablet Take 1 tablet (5 mg total) by mouth daily.    ? ?No current facility-administered medications for this visit.  ? ? ?PHYSICAL EXAMINATION: ?ECOG PERFORMANCE STATUS: 1 - Symptomatic but completely ambulatory ? ?Vitals:  ? 03/24/21 1503  ?BP: (!) 159/83  ?Pulse: 72  ?Resp: 18  ?Temp: 97.9 ?F (36.6 ?C)  ?SpO2: 100%  ? ?Filed Weights  ? 03/24/21 1503  ?Weight: 190 lb 3.2 oz (86.3 kg)  ? ?  ? ?LABORATORY DATA:  ?I  have reviewed the data as listed ? ?  Latest Ref Rng & Units 02/25/2021  ?  9:36 AM 12/08/2014  ?  3:33 PM 12/08/2014  ?  1:36 PM  ?CMP  ?Glucose 70 - 99 mg/dL 88   98   94    ?BUN 8 - 23 mg/dL '10   12   15    ' ?Creatinine 0.44 - 1.00 mg/dL 0.95   1.01   1.00    ?Sodium 135 - 145 mmol/L 137   140   145    ?Potassium 3.5 - 5.1 mmol/L 3.9   4.1   3.7    ?Chloride 98 - 111 mmol/L 103   110   103    ?CO2 22 - 32 mmol/L 27   24     ?Calcium 8.9 - 10.3 mg/dL 8.9   9.2     ?Total Protein 6.5 - 8.1 g/dL  7.0     ?Total  Bilirubin 0.3 - 1.2 mg/dL  0.5     ?Alkaline Phos 38 - 126 U/L  89     ?AST 15 - 41 U/L  24     ?ALT 14 - 54 U/L  19     ? ? ?Lab Results  ?Component Value Date  ? WBC 4.5 02/25/2021  ? HGB 12.9 02/25/2021  ? HCT 41.6 02/25/2021  ? MCV 87.9 02/25/2021  ? PLT 201 02/25/2021  ? ? ?ASSESSMENT & PLAN:  ?Malignant neoplasm of lower-outer quadrant of right breast of female, estrogen receptor positive ([place]) ?01/29/2021: Screening mammogram detected 1.2 cm right breast mass: Grade 2 IDC with DCIS ER 90%, PR 30%, HER2 negative ?Oncotype score: 28: 17% risk of distant recurrence at 10 years ?Current treatment: Neoadjuvant letrozole ?Letrozole toxicities: ? ?Recommendation: ?1.  Breast conserving surgery with sentinel lymph node biopsy ?2. Adjuvant chemotherapy with Taxotere and Cytoxan every 3 weeks x4 cycles ?3.  Adjuvant radiation ?4.  Followed by adjuvant antiestrogen therapy ? ?Chemotherapy Counseling: I discussed the risks and benefits of chemotherapy including the risks of nausea/ vomiting, risk of infection from low WBC count, fatigue due to chemo or anemia, bruising or bleeding due to low platelets, mouth sores, loss/ change in taste and decreased appetite. Liver and kidney function will be monitored through out chemotherapy as abnormalities in liver and kidney function may be a side effect of treatment.  Neuropathy risk was discussed in detail. Risk of permanent bone marrow dysfunction and leukemia due to chemo were also discussed. ? ?Return to clinic after surgery to discuss results  ? ?No orders of the defined types were placed in this encounter. ? ?The patient has a good understanding of the overall plan. she agrees with it. she will call with any problems that may develop before the next visit here. ?Total time spent: 45 mins including face to face time and time spent for planning, charting and co-ordination of care ? ? Harriette Ohara, MD ?03/24/21 ? ? ? I Gardiner Coins am scribing for Dr. Lindi Adie ? ?I have  reviewed the above documentation for accuracy and completeness, and I agree with the above. ?. ?

## 2021-03-24 ENCOUNTER — Encounter: Payer: Self-pay | Admitting: *Deleted

## 2021-03-24 ENCOUNTER — Encounter: Payer: Self-pay | Admitting: Hematology and Oncology

## 2021-03-24 ENCOUNTER — Inpatient Hospital Stay: Payer: Medicare PPO | Attending: Hematology and Oncology | Admitting: Hematology and Oncology

## 2021-03-24 ENCOUNTER — Other Ambulatory Visit: Payer: Self-pay

## 2021-03-24 VITALS — BP 159/83 | HR 72 | Temp 97.9°F | Resp 18 | Ht 63.0 in | Wt 190.2 lb

## 2021-03-24 DIAGNOSIS — C50511 Malignant neoplasm of lower-outer quadrant of right female breast: Secondary | ICD-10-CM

## 2021-03-24 DIAGNOSIS — Z170 Estrogen receptor positive status [ER+]: Secondary | ICD-10-CM

## 2021-03-24 DIAGNOSIS — Z79811 Long term (current) use of aromatase inhibitors: Secondary | ICD-10-CM | POA: Diagnosis not present

## 2021-03-24 MED ORDER — LIDOCAINE-PRILOCAINE 2.5-2.5 % EX CREA: TOPICAL_CREAM | CUTANEOUS | 3 refills | Status: DC

## 2021-03-24 MED ORDER — PROCHLORPERAZINE MALEATE 10 MG PO TABS: 10.0000 mg | ORAL_TABLET | Freq: Four times a day (QID) | ORAL | 1 refills | Status: DC | PRN

## 2021-03-24 MED ORDER — ONDANSETRON HCL 8 MG PO TABS: 8.0000 mg | ORAL_TABLET | Freq: Two times a day (BID) | ORAL | 1 refills | Status: DC | PRN

## 2021-03-24 MED ORDER — DEXAMETHASONE 4 MG PO TABS: 4.0000 mg | ORAL_TABLET | Freq: Every day | ORAL | 0 refills | Status: DC

## 2021-03-24 NOTE — Research (Signed)
Trial:  [id number] - TREATMENT OF REFRACTORY NAUSEA  ?Patient Catherine [name] was identified by Dr. Lindi Adie as a potential candidate for the above listed study.  This Clinical Research Nurse met with Catherine [name], [id number], on 03/24/21 in a manner and location that ensures patient privacy to discuss participation in the above listed research study.  Patient is Accompanied by a friend from church .  A copy of the informed consent document and separate HIPAA Authorization was provided to the patient.  Patient reads, speaks, and understands [place].   ?Approximately 10 minutes were spent with the patient reviewing the informed consent documents.  Patient declined participation.  She prefers to have standard of care for her nausea medications.  ?Dr. Lindi Adie was notified.  ?Trial:  [id number]: Use of a Clinical Trial Screening Tool to Address Cancer Health Disparities in the [facility])  ?This research nurse identified patient as a potential candidate for the above listed study.  Patient was also provided a brief introduction on the DCP study.  Patient declined to participate, no reason was given.  ?Thanked patient for her time today.  ? ?Foye Spurling, BSN, RN, CCRP ?Clinical Research Nurse II ?03/24/2021 3:54 PM ? ? ?

## 2021-03-24 NOTE — Progress Notes (Signed)
START ON PATHWAY REGIMEN - Breast ? ? ?  A cycle is every 21 days: ?    Docetaxel  ?    Cyclophosphamide  ? ?**Always confirm dose/schedule in your pharmacy ordering system** ? ?Patient Characteristics: ?Postoperative without Neoadjuvant Therapy (Pathologic Staging), Invasive Disease, Adjuvant Therapy, HER2 Negative/Unknown/Equivocal, ER Positive, Node Positive, Node Positive (1-3), Oncotyping Ordered, Oncotype High Risk (? 26) ?Therapeutic Status: Postoperative without Neoadjuvant Therapy (Pathologic Staging) ?AJCC Grade: G2 ?AJCC N Category: pN1 ?AJCC M Category: cM0 ?ER Status: Positive (+) ?AJCC 8 Stage Grouping: IA ?HER2 Status: Negative (-) ?Oncotype Dx Recurrence Score: 28 ?AJCC T Category: pT1c ?PR Status: Positive (+) ?Adjuvant Therapy Status: No Adjuvant Therapy Received Yet or Changing Initial Adjuvant Regimen due to Tolerance ?Has this patient completed genomic testing<= Yes - Oncotype DX(R) ?Intent of Therapy: ?Curative Intent, Discussed with Patient ?

## 2021-03-24 NOTE — Assessment & Plan Note (Signed)
01/29/2021: Screening mammogram detected 1.2 cm right breast mass: Grade 2 IDC with DCIS ER 90%, PR 30%, HER2 negative ?Oncotype score: 28: 17% risk of distant recurrence at 10 years ?Current treatment: Neoadjuvant letrozole ?Letrozole toxicities: ? ?Recommendation: ?1.  Breast conserving surgery with sentinel lymph node biopsy ?2. Adjuvant chemotherapy with Taxotere and Cytoxan every 3 weeks x4 cycles ?3.  Adjuvant radiation ?4.  Followed by adjuvant antiestrogen therapy ? ?Chemotherapy Counseling: I discussed the risks and benefits of chemotherapy including the risks of nausea/ vomiting, risk of infection from low WBC count, fatigue due to chemo or anemia, bruising or bleeding due to low platelets, mouth sores, loss/ change in taste and decreased appetite. Liver and kidney function will be monitored through out chemotherapy as abnormalities in liver and kidney function may be a side effect of treatment.  Neuropathy risk was discussed in detail. Risk of permanent bone marrow dysfunction and leukemia due to chemo were also discussed. ? ? ? ?Return to clinic after surgery to discuss results  ?

## 2021-03-25 ENCOUNTER — Ambulatory Visit: Payer: Self-pay | Admitting: Surgery

## 2021-03-25 ENCOUNTER — Encounter: Payer: Self-pay | Admitting: *Deleted

## 2021-03-26 ENCOUNTER — Ambulatory Visit: Payer: Self-pay | Admitting: Surgery

## 2021-03-26 NOTE — H&P (Signed)
?  Interval History:  ? ?This is a 66 year old female who recently underwent routine screening mammogram in the [facility].  She was found to have a mass in the right lower outer quadrant at 8:00 located 9 cm from the nipple measuring 12 mm in diameter.  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 Her 2 status is pending. ?  ?No family history of breast cancer in first degree relatives.   ?  ?She has seen Dr. Lindi Adie of Oncology who has recommended breast conserving therapy.  The patient is temporarily on Letrozole until surgery.  She is accompanied by her husband and a close friend who is a [employer] scientist.   ? ?On 03/02/2021, she underwent right radioactive seed localized lumpectomy and sentinel lymph node biopsy.  Pathology revealed 1 of 4 positive lymph nodes.  The anterior/medial margin is focally positive.  Oncotype is high risk.  She has seen oncology and plans to have chemotherapy followed by radiation. ? ?Physical Examination:  ? ?Physical Exam  ? ?Her right inframammary breast incision is healing well with no sign of infection.  No seroma. ?Her right axillary incision is healing well with no sign of infection ? ?Assessment and Plan:  ? ?Catherine [name] is a 66 y.o. female who underwent right radioactive seed localized lumpectomy with sentinel lymph node biopsy on 03/02/2021. ? ?Diagnoses and all orders for this visit: ? ?Invasive ductal carcinoma of breast, female, right (CMS-HCC) ? ?  ? ?We will plan to reexcise the lumpectomy margins as well as ultrasound-guided port placement.The surgical procedure has been discussed with the patient.  Potential risks, benefits, alternative treatments, and expected outcomes have been explained.  All of the patient's questions at this time have been answered.  The likelihood of reaching the patient's treatment goal is good.  The patient understand the proposed surgical procedure and wishes to proceed. ? ? ? ?The plan was discussed in  detail with the patient today, who expressed understanding.  The patient has my contact information, and understands to call me with any additional questions or concerns in the interval.  I would be happy to see the patient back sooner if the need arises.  ? ?Michalene Debruler Jearld Adjutant, MD  ?

## 2021-03-26 NOTE — H&P (View-Only) (Signed)
?  Interval History:  ? ?This is a 66 year old female who recently underwent routine screening mammogram in the [facility].  She was found to have a mass in the right lower outer quadrant at 8:00 located 9 cm from the nipple measuring 12 mm in diameter.  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 Her 2 status is pending. ?  ?No family history of breast cancer in first degree relatives.   ?  ?She has seen Dr. Lindi Adie of Oncology who has recommended breast conserving therapy.  The patient is temporarily on Letrozole until surgery.  She is accompanied by her husband and a close friend who is a [employer] scientist.   ? ?On 03/02/2021, she underwent right radioactive seed localized lumpectomy and sentinel lymph node biopsy.  Pathology revealed 1 of 4 positive lymph nodes.  The anterior/medial margin is focally positive.  Oncotype is high risk.  She has seen oncology and plans to have chemotherapy followed by radiation. ? ?Physical Examination:  ? ?Physical Exam  ? ?Her right inframammary breast incision is healing well with no sign of infection.  No seroma. ?Her right axillary incision is healing well with no sign of infection ? ?Assessment and Plan:  ? ?Catherine [name] is a 66 y.o. female who underwent right radioactive seed localized lumpectomy with sentinel lymph node biopsy on 03/02/2021. ? ?Diagnoses and all orders for this visit: ? ?Invasive ductal carcinoma of breast, female, right (CMS-HCC) ? ?  ? ?We will plan to reexcise the lumpectomy margins as well as ultrasound-guided port placement.The surgical procedure has been discussed with the patient.  Potential risks, benefits, alternative treatments, and expected outcomes have been explained.  All of the patient's questions at this time have been answered.  The likelihood of reaching the patient's treatment goal is good.  The patient understand the proposed surgical procedure and wishes to proceed. ? ? ? ?The plan was discussed in  detail with the patient today, who expressed understanding.  The patient has my contact information, and understands to call me with any additional questions or concerns in the interval.  I would be happy to see the patient back sooner if the need arises.  ? ?Cherish Runde Jearld Adjutant, MD  ?

## 2021-03-29 ENCOUNTER — Encounter: Payer: Self-pay | Admitting: *Deleted

## 2021-03-30 ENCOUNTER — Other Ambulatory Visit: Payer: Self-pay

## 2021-03-30 ENCOUNTER — Encounter (HOSPITAL_COMMUNITY): Payer: Self-pay | Admitting: Surgery

## 2021-03-30 NOTE — Progress Notes (Signed)
Mrs Catherine [name] denies chest pain or shortness of breath.  Patient denies having any s/s of Covid in her household.  Patient denies any known exposure to Covid.  ? ?Mrs. Catherine [name]'s PCP is Dr. Harlan Stains. ? ?I instructed Mrs Catherine [name] to shower with antibacteria soap.  DO not shave. No nail polish, artificial or acrylic nails. Wear clean clothes, brush your teeth. ?Glasses, contact lens,dentures or partials may not be worn in the OR. If you need to wear them, please bring a case for glasses, do not wear contacts or bring a case, the hospital does not have contact cases, dentures or partials will have to be removed , make sure they are clean, we will provide a denture cup to put them in. You will need some one to drive you home and a responsible person over the age of 23 to stay with you for the first 24 hours after surgery.  ?

## 2021-03-31 ENCOUNTER — Ambulatory Visit (HOSPITAL_COMMUNITY): Payer: Medicare PPO | Admitting: Certified Registered Nurse Anesthetist

## 2021-03-31 ENCOUNTER — Ambulatory Visit (HOSPITAL_COMMUNITY): Payer: Medicare PPO

## 2021-03-31 ENCOUNTER — Other Ambulatory Visit: Payer: Self-pay

## 2021-03-31 ENCOUNTER — Encounter (HOSPITAL_COMMUNITY): Payer: Self-pay | Admitting: Surgery

## 2021-03-31 ENCOUNTER — Ambulatory Visit (HOSPITAL_COMMUNITY)
Admission: RE | Admit: 2021-03-31 | Discharge: 2021-03-31 | Disposition: A | Discharge status: Home / Self Care | Payer: Medicare PPO | Attending: Surgery | Admitting: Surgery

## 2021-03-31 ENCOUNTER — Encounter (HOSPITAL_COMMUNITY)
Admission: RE | Disposition: A | Discharge status: Home / Self Care | Payer: Self-pay | Source: Home / Self Care | Attending: Surgery

## 2021-03-31 ENCOUNTER — Ambulatory Visit (HOSPITAL_BASED_OUTPATIENT_CLINIC_OR_DEPARTMENT_OTHER): Payer: Medicare PPO | Admitting: Certified Registered Nurse Anesthetist

## 2021-03-31 DIAGNOSIS — F419 Anxiety disorder, unspecified: Secondary | ICD-10-CM | POA: Insufficient documentation

## 2021-03-31 DIAGNOSIS — Z170 Estrogen receptor positive status [ER+]: Secondary | ICD-10-CM | POA: Diagnosis not present

## 2021-03-31 DIAGNOSIS — J9811 Atelectasis: Secondary | ICD-10-CM | POA: Diagnosis not present

## 2021-03-31 DIAGNOSIS — Z79899 Other long term (current) drug therapy: Secondary | ICD-10-CM | POA: Insufficient documentation

## 2021-03-31 DIAGNOSIS — D0511 Intraductal carcinoma in situ of right breast: Secondary | ICD-10-CM

## 2021-03-31 DIAGNOSIS — E039 Hypothyroidism, unspecified: Secondary | ICD-10-CM | POA: Diagnosis not present

## 2021-03-31 DIAGNOSIS — I10 Essential (primary) hypertension: Secondary | ICD-10-CM | POA: Diagnosis not present

## 2021-03-31 DIAGNOSIS — C50911 Malignant neoplasm of unspecified site of right female breast: Secondary | ICD-10-CM | POA: Diagnosis not present

## 2021-03-31 HISTORY — DX: Personal history of urinary calculi: Z87.442

## 2021-03-31 HISTORY — PX: RE-EXCISION OF BREAST LUMPECTOMY: SHX6048

## 2021-03-31 HISTORY — PX: PORTACATH PLACEMENT: SHX2246

## 2021-03-31 LAB — CBC
HCT: 41 % (ref 36.0–46.0)
Hemoglobin: 13.2 g/dL (ref 12.0–15.0)
MCH: 27.7 pg (ref 26.0–34.0)
MCHC: 32.2 g/dL (ref 30.0–36.0)
MCV: 86 fL (ref 80.0–100.0)
Platelets: 199 10*3/uL (ref 150–400)
RBC: 4.77 MIL/uL (ref 3.87–5.11)
RDW: 13.2 % (ref 11.5–15.5)
WBC: 4.1 10*3/uL (ref 4.0–10.5)
nRBC: 0 % (ref 0.0–0.2)

## 2021-03-31 LAB — BASIC METABOLIC PANEL
Anion gap: 8 (ref 5–15)
BUN: 14 mg/dL (ref 8–23)
CO2: 25 mmol/L (ref 22–32)
Calcium: 9.6 mg/dL (ref 8.9–10.3)
Chloride: 106 mmol/L (ref 98–111)
Creatinine, Ser: 1.06 mg/dL — ABNORMAL HIGH (ref 0.44–1.00)
GFR, Estimated: 58 mL/min — ABNORMAL LOW (ref >60)
Glucose, Bld: 89 mg/dL (ref 70–99)
Potassium: 4.5 mmol/L (ref 3.5–5.1)
Sodium: 139 mmol/L (ref 135–145)

## 2021-03-31 SURGERY — EXCISION, LESION, BREAST
Anesthesia: General | Site: Breast | Laterality: Right

## 2021-03-31 MED ORDER — FENTANYL CITRATE (PF) 250 MCG/5ML IJ SOLN
INTRAMUSCULAR | Status: AC
  Filled 2021-03-31: qty 5

## 2021-03-31 MED ORDER — BUPIVACAINE-EPINEPHRINE 0.25% -1:200000 IJ SOLN
INTRAMUSCULAR | Status: DC | PRN
  Administered 2021-03-31: 17 mL

## 2021-03-31 MED ORDER — 0.9 % SODIUM CHLORIDE (POUR BTL) OPTIME
TOPICAL | Status: DC | PRN
  Administered 2021-03-31: 1000 mL

## 2021-03-31 MED ORDER — HEPARIN SOD (PORK) LOCK FLUSH 100 UNIT/ML IV SOLN
INTRAVENOUS | Status: AC
  Filled 2021-03-31: qty 5

## 2021-03-31 MED ORDER — HEPARIN 6000 UNIT IRRIGATION SOLUTION
Status: AC
  Filled 2021-03-31: qty 500

## 2021-03-31 MED ORDER — BUPIVACAINE-EPINEPHRINE (PF) 0.25% -1:200000 IJ SOLN
INTRAMUSCULAR | Status: AC
  Filled 2021-03-31: qty 30

## 2021-03-31 MED ORDER — FENTANYL CITRATE (PF) 100 MCG/2ML IJ SOLN
INTRAMUSCULAR | Status: DC | PRN
  Administered 2021-03-31 (×2): 25 ug via INTRAVENOUS
  Administered 2021-03-31: 50 ug via INTRAVENOUS
  Administered 2021-03-31 (×2): 25 ug via INTRAVENOUS

## 2021-03-31 MED ORDER — ONDANSETRON HCL 4 MG/2ML IJ SOLN
INTRAMUSCULAR | Status: DC | PRN
  Administered 2021-03-31: 4 mg via INTRAVENOUS

## 2021-03-31 MED ORDER — PHENYLEPHRINE 40 MCG/ML (10ML) SYRINGE FOR IV PUSH (FOR BLOOD PRESSURE SUPPORT)
PREFILLED_SYRINGE | INTRAVENOUS | Status: DC | PRN
  Administered 2021-03-31 (×2): 80 ug via INTRAVENOUS
  Administered 2021-03-31 (×2): 40 ug via INTRAVENOUS
  Administered 2021-03-31 (×2): 80 ug via INTRAVENOUS

## 2021-03-31 MED ORDER — FENTANYL CITRATE (PF) 100 MCG/2ML IJ SOLN
INTRAMUSCULAR | Status: AC
  Filled 2021-03-31: qty 2

## 2021-03-31 MED ORDER — DEXAMETHASONE SODIUM PHOSPHATE 4 MG/ML IJ SOLN
INTRAMUSCULAR | Status: DC | PRN
  Administered 2021-03-31: 5 mg via INTRAVENOUS

## 2021-03-31 MED ORDER — MIDAZOLAM HCL 5 MG/5ML IJ SOLN
INTRAMUSCULAR | Status: DC | PRN
  Administered 2021-03-31: 2 mg via INTRAVENOUS

## 2021-03-31 MED ORDER — ACETAMINOPHEN 10 MG/ML IV SOLN
INTRAVENOUS | Status: DC | PRN
  Administered 2021-03-31: 1000 mg via INTRAVENOUS

## 2021-03-31 MED ORDER — PROPOFOL 10 MG/ML IV BOLUS
INTRAVENOUS | Status: AC
  Filled 2021-03-31: qty 20

## 2021-03-31 MED ORDER — ORAL CARE MOUTH RINSE: 15.0000 mL | Freq: Once | OROMUCOSAL | Status: AC

## 2021-03-31 MED ORDER — HEPARIN SOD (PORK) LOCK FLUSH 100 UNIT/ML IV SOLN
INTRAVENOUS | Status: DC | PRN
  Administered 2021-03-31: 500 [IU]

## 2021-03-31 MED ORDER — OXYCODONE HCL 5 MG PO TABS
ORAL_TABLET | ORAL | Status: AC
  Filled 2021-03-31: qty 1

## 2021-03-31 MED ORDER — ONDANSETRON HCL 4 MG/2ML IJ SOLN
INTRAMUSCULAR | Status: AC
  Filled 2021-03-31: qty 2

## 2021-03-31 MED ORDER — MIDAZOLAM HCL 2 MG/2ML IJ SOLN: 0.5000 mg | Freq: Once | INTRAMUSCULAR | Status: DC | PRN

## 2021-03-31 MED ORDER — ACETAMINOPHEN 10 MG/ML IV SOLN
INTRAVENOUS | Status: AC
  Filled 2021-03-31: qty 100

## 2021-03-31 MED ORDER — PROPOFOL 10 MG/ML IV BOLUS
INTRAVENOUS | Status: DC | PRN
  Administered 2021-03-31: 100 mg via INTRAVENOUS
  Administered 2021-03-31: 150 mg via INTRAVENOUS
  Administered 2021-03-31: 50 mg via INTRAVENOUS

## 2021-03-31 MED ORDER — OXYCODONE HCL 5 MG/5ML PO SOLN: 5.0000 mg | Freq: Once | ORAL | Status: AC | PRN

## 2021-03-31 MED ORDER — CEFAZOLIN SODIUM-DEXTROSE 2-4 GM/100ML-% IV SOLN
2.0000 g | INTRAVENOUS | Status: AC
  Administered 2021-03-31: 2 g via INTRAVENOUS
  Filled 2021-03-31: qty 100

## 2021-03-31 MED ORDER — GLYCOPYRROLATE PF 0.2 MG/ML IJ SOSY
PREFILLED_SYRINGE | INTRAMUSCULAR | Status: DC | PRN
  Administered 2021-03-31: .2 mg via INTRAVENOUS

## 2021-03-31 MED ORDER — CHLORHEXIDINE GLUCONATE CLOTH 2 % EX PADS: 6.0000 | MEDICATED_PAD | Freq: Once | CUTANEOUS | Status: DC

## 2021-03-31 MED ORDER — DEXAMETHASONE SODIUM PHOSPHATE 10 MG/ML IJ SOLN
INTRAMUSCULAR | Status: AC
  Filled 2021-03-31: qty 1

## 2021-03-31 MED ORDER — CEFAZOLIN SODIUM-DEXTROSE 2-4 GM/100ML-% IV SOLN
INTRAVENOUS | Status: AC
  Filled 2021-03-31: qty 100

## 2021-03-31 MED ORDER — HYDROCODONE-ACETAMINOPHEN 5-325 MG PO TABS: 1.0000 | ORAL_TABLET | Freq: Four times a day (QID) | ORAL | 0 refills | Status: DC | PRN

## 2021-03-31 MED ORDER — ACETAMINOPHEN 500 MG PO TABS
1000.0000 mg | ORAL_TABLET | ORAL | Status: DC
  Filled 2021-03-31: qty 2

## 2021-03-31 MED ORDER — MIDAZOLAM HCL 2 MG/2ML IJ SOLN
INTRAMUSCULAR | Status: AC
  Filled 2021-03-31: qty 2

## 2021-03-31 MED ORDER — LIDOCAINE HCL (CARDIAC) PF 100 MG/5ML IV SOSY
PREFILLED_SYRINGE | INTRAVENOUS | Status: DC | PRN
Start: 2021-03-31 — End: 2021-03-31
  Administered 2021-03-31: 40 mg via INTRAVENOUS

## 2021-03-31 MED ORDER — FENTANYL CITRATE (PF) 100 MCG/2ML IJ SOLN
25.0000 ug | INTRAMUSCULAR | Status: DC | PRN
  Administered 2021-03-31 (×3): 50 ug via INTRAVENOUS

## 2021-03-31 MED ORDER — HEPARIN 6000 UNIT IRRIGATION SOLUTION
Status: DC | PRN
  Administered 2021-03-31: 1

## 2021-03-31 MED ORDER — CHLORHEXIDINE GLUCONATE 0.12 % MT SOLN
15.0000 mL | Freq: Once | OROMUCOSAL | Status: AC
  Administered 2021-03-31: 15 mL via OROMUCOSAL
  Filled 2021-03-31: qty 15

## 2021-03-31 MED ORDER — GLYCOPYRROLATE PF 0.2 MG/ML IJ SOSY
PREFILLED_SYRINGE | INTRAMUSCULAR | Status: AC
  Filled 2021-03-31: qty 1

## 2021-03-31 MED ORDER — EPHEDRINE SULFATE-NACL 50-0.9 MG/10ML-% IV SOSY
PREFILLED_SYRINGE | INTRAVENOUS | Status: DC | PRN
  Administered 2021-03-31 (×3): 5 mg via INTRAVENOUS

## 2021-03-31 MED ORDER — LACTATED RINGERS IV SOLN: INTRAVENOUS | Status: DC

## 2021-03-31 MED ORDER — OXYCODONE HCL 5 MG PO TABS
5.0000 mg | ORAL_TABLET | Freq: Once | ORAL | Status: AC | PRN
  Administered 2021-03-31: 5 mg via ORAL

## 2021-03-31 MED ORDER — MEPERIDINE HCL 25 MG/ML IJ SOLN: 6.2500 mg | INTRAMUSCULAR | Status: DC | PRN

## 2021-03-31 SURGICAL SUPPLY — 61 items
APPLIER CLIP 9.375 MED OPEN (MISCELLANEOUS)
BAG COUNTER SPONGE SURGICOUNT (BAG) ×3 IMPLANT
BAG DECANTER FOR FLEXI CONT (MISCELLANEOUS) ×3 IMPLANT
BENZOIN TINCTURE PRP APPL 2/3 (GAUZE/BANDAGES/DRESSINGS) ×3 IMPLANT
BINDER BREAST LRG (GAUZE/BANDAGES/DRESSINGS) IMPLANT
BINDER BREAST XLRG (GAUZE/BANDAGES/DRESSINGS) IMPLANT
CANISTER SUCT 3000ML PPV (MISCELLANEOUS) ×3 IMPLANT
CHLORAPREP W/TINT 10.5 ML (MISCELLANEOUS) ×3 IMPLANT
CHLORAPREP W/TINT 26 (MISCELLANEOUS) ×3 IMPLANT
CLIP APPLIE 9.375 MED OPEN (MISCELLANEOUS) IMPLANT
CLSR STERI-STRIP ANTIMIC 1/2X4 (GAUZE/BANDAGES/DRESSINGS) ×1 IMPLANT
COVER PROBE W GEL 5X96 (DRAPES) ×3 IMPLANT
COVER SURGICAL LIGHT HANDLE (MISCELLANEOUS) ×3 IMPLANT
COVER TRANSDUCER ULTRASND GEL (DISPOSABLE) ×1 IMPLANT
DEVICE DUBIN SPECIMEN MAMMOGRA (MISCELLANEOUS) ×3 IMPLANT
DRAPE C-ARM 42X120 X-RAY (DRAPES) ×3 IMPLANT
DRAPE CHEST BREAST 15X10 FENES (DRAPES) ×3 IMPLANT
DRAPE LAPAROSCOPIC ABDOMINAL (DRAPES) ×3 IMPLANT
DRSG IV TEGADERM 3.5X4.5 STRL (GAUZE/BANDAGES/DRESSINGS) ×1 IMPLANT
DRSG TEGADERM 4X4.75 (GAUZE/BANDAGES/DRESSINGS) ×3 IMPLANT
ELECT CAUTERY BLADE 6.4 (BLADE) ×3 IMPLANT
ELECT REM PT RETURN 9FT ADLT (ELECTROSURGICAL) ×3
ELECTRODE REM PT RTRN 9FT ADLT (ELECTROSURGICAL) ×2 IMPLANT
GAUZE 4X4 16PLY ~~LOC~~+RFID DBL (SPONGE) ×3 IMPLANT
GAUZE SPONGE 2X2 8PLY NS (GAUZE/BANDAGES/DRESSINGS) ×1 IMPLANT
GAUZE SPONGE 2X2 8PLY STRL LF (GAUZE/BANDAGES/DRESSINGS) ×2 IMPLANT
GEL ULTRASOUND 20GR AQUASONIC (MISCELLANEOUS) IMPLANT
GLOVE SURG ENC MOIS LTX SZ7 (GLOVE) ×3 IMPLANT
GLOVE SURG UNDER POLY LF SZ7.5 (GLOVE) ×3 IMPLANT
GOWN STRL REUS W/ TWL LRG LVL3 (GOWN DISPOSABLE) ×4 IMPLANT
GOWN STRL REUS W/TWL LRG LVL3 (GOWN DISPOSABLE) ×2
ILLUMINATOR WAVEGUIDE N/F (MISCELLANEOUS) IMPLANT
INTRODUCER COOK 11FR (CATHETERS) IMPLANT
KIT BASIN OR (CUSTOM PROCEDURE TRAY) ×3 IMPLANT
KIT MARKER MARGIN INK (KITS) ×3 IMPLANT
KIT PORT POWER 8FR ISP CVUE (Port) ×1 IMPLANT
KIT TURNOVER KIT B (KITS) ×3 IMPLANT
LIGHT WAVEGUIDE WIDE FLAT (MISCELLANEOUS) IMPLANT
NDL HYPO 25GX1X1/2 BEV (NEEDLE) ×2 IMPLANT
NEEDLE HYPO 25GX1X1/2 BEV (NEEDLE) ×3 IMPLANT
NS IRRIG 1000ML POUR BTL (IV SOLUTION) ×3 IMPLANT
PACK GENERAL/GYN (CUSTOM PROCEDURE TRAY) ×3 IMPLANT
PAD ARMBOARD 7.5X6 YLW CONV (MISCELLANEOUS) ×3 IMPLANT
PENCIL BUTTON HOLSTER BLD 10FT (ELECTRODE) ×3 IMPLANT
POSITIONER HEAD DONUT 9IN (MISCELLANEOUS) ×3 IMPLANT
SET INTRODUCER 12FR PACEMAKER (INTRODUCER) IMPLANT
SET SHEATH INTRODUCER 10FR (MISCELLANEOUS) IMPLANT
SHEATH COOK PEEL AWAY SET 9F (SHEATH) IMPLANT
SPONGE GAUZE 2X2 STER 10/PKG (GAUZE/BANDAGES/DRESSINGS) ×1
SPONGE T-LAP 4X18 ~~LOC~~+RFID (SPONGE) ×3 IMPLANT
STRIP CLOSURE SKIN 1/2X4 (GAUZE/BANDAGES/DRESSINGS) ×3 IMPLANT
SUT MNCRL AB 4-0 PS2 18 (SUTURE) ×3 IMPLANT
SUT PROLENE 2 0 SH DA (SUTURE) ×3 IMPLANT
SUT SILK 2 0 SH (SUTURE) IMPLANT
SUT VIC AB 3-0 SH 27 (SUTURE) ×1
SUT VIC AB 3-0 SH 27X BRD (SUTURE) ×2 IMPLANT
SYR 5ML LUER SLIP (SYRINGE) ×3 IMPLANT
SYR CONTROL 10ML LL (SYRINGE) ×3 IMPLANT
TOWEL GREEN STERILE (TOWEL DISPOSABLE) ×3 IMPLANT
TOWEL GREEN STERILE FF (TOWEL DISPOSABLE) ×3 IMPLANT
TRAY LAPAROSCOPIC MC (CUSTOM PROCEDURE TRAY) ×3 IMPLANT

## 2021-03-31 NOTE — Discharge Instructions (Signed)
? ? ?  PORT-A-CATH: POST OP INSTRUCTIONS  Always review your discharge instruction sheet given to you by the facility where your surgery was performed.   A prescription for pain medication may be given to you upon discharge. Take your pain medication as prescribed, if needed. If narcotic pain medicine is not needed, then you make take acetaminophen (Tylenol) or ibuprofen (Advil) as needed.  Take your usually prescribed medications unless otherwise directed. If you need a refill on your pain medication, please contact our office. All narcotic pain medicine now requires a paper prescription.  Phoned in and fax refills are no longer allowed by law.  Prescriptions will not be filled after 5 pm or on weekends.  You should follow a light diet for the remainder of the day after your procedure. Most patients will experience some mild swelling and/or bruising in the area of the incision. It may take several days to resolve. It is common to experience some constipation if taking pain medication after surgery. Increasing fluid intake and taking a stool softener (such as Colace) will usually help or prevent this problem from occurring. A mild laxative (Milk of Magnesia or Miralax) should be taken according to package directions if there are no bowel movements after 48 hours.  Unless discharge instructions indicate otherwise, you may remove your bandages 48 hours after surgery, and you may shower at that time. You may have steri-strips (small white skin tapes) in place directly over the incision.  These strips should be left on the skin for 7-10 days.   If your port is left accessed at the end of surgery (needle left in port), the dressing cannot get wet and should only by changed by a healthcare professional. When the port is no longer accessed (when the needle has been removed), follow step 7.   ACTIVITIES:  Limit activity involving your arms for the next 72 hours. Do no strenuous exercise or activity for 1 week.  You may drive when you are no longer taking prescription pain medication, you can comfortably wear a seatbelt, and you can maneuver your car. 10.You may need to see your doctor in the office for a follow-up appointment.  Please       check with your doctor.  11.When you receive a new Port-a-Cath, you will get a product guide and        ID card.  Please keep them in case you need them.  WHEN TO CALL YOUR DOCTOR ([phone]): Fever over 101.0 Chills Continued bleeding from incision Increased redness and tenderness at the site Shortness of breath, difficulty breathing   The clinic staff is available to answer your questions during regular business hours. Please don't hesitate to call and ask to speak to one of the nurses or medical assistants for clinical concerns. If you have a medical emergency, go to the nearest emergency room or call 911.  A surgeon from [facility] is always on call at the hospital.         

## 2021-03-31 NOTE — Interval H&P Note (Signed)
History and Physical Interval Note: ? ?03/31/2021 ?8:02 AM ? ?[name]  has presented today for surgery, with the diagnosis of RIGHT BREAST INVASIVE DUCTAL CARCINOMA.  The various methods of treatment have been discussed with the patient and family. After consideration of risks, benefits and other options for treatment, the patient has consented to  Procedure(s): ?RE-EXCISION OF RIGHT BREAST LUMPECTOMY MARGINS (Right) ?INSERTION PORT-A-CATH (N/A) as a surgical intervention.  The patient's history has been reviewed, patient examined, no change in status, stable for surgery.  I have reviewed the patient's chart and labs.  Questions were answered to the patient's satisfaction.   ? ? ?Catherine [name] ? ? ?

## 2021-03-31 NOTE — Op Note (Signed)
Preop diagnosis: Right breast invasive ductal carcinoma ?Postop diagnosis: Same ?Procedure performed: Ultrasound guided right internal jugular vein port placement/ Reexcision of right breast lumpectomy ?Surgeon:Reuven Braver K Ariday Brinker ?Assistant:  Carlena Hurl, PA-C ?Anesthesia: General via LMA ?Indications: This is a 66 year old female who recently underwent routine screening mammogram in the [facility].  She was found to have a mass in the right lower outer quadrant at 8:00 located 9 cm from the nipple measuring 12 mm in diameter.  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 Her 2 status is pending. ?  ?No family history of breast cancer in first degree relatives.   ?  ?She has seen Dr. Lindi Adie of Oncology who has recommended breast conserving therapy.  The patient is temporarily on Letrozole until surgery.  She is accompanied by her husband and a close friend who is a Marine scientist.   ?  ?On 03/02/2021, she underwent right radioactive seed localized lumpectomy and sentinel lymph node biopsy.  Pathology revealed 1 of 4 positive lymph nodes.  The anterior/medial margin is focally positive.  Oncotype is high risk.  She has seen oncology and plans to have chemotherapy followed by radiation. ? ? ?Description of procedure: The patient is brought to the operating room placed in the supine position on the operating table.  After an adequate level of general anesthesia was obtained, the patient right arm was tucked at her side.  Her right chest and neck were prepped with ChloraPrep and draped sterile fashion.  A timeout was taken to ensure the proper patient and proper procedure.  She was placed in Trendelenburg position.  We interrogated her neck with the ultrasound.  The jugular vein is easily identified.  Using ultrasound guidance we directly cannulated the internal jugular vein with good blood return.  The wire passed easily.  Fluoroscopy confirmed that the wire headed down the right side  of the mediastinum.  The needle was removed.  We created a subcutaneous pocket below the right clavicle.  We first anesthetized with local anesthetic.  We created a subcutaneous tunnel from the subcutaneous pocket to the insertion site on the neck.  An 8 French [place] port was assembled and was tunneled from the subcutaneous pocket to the insertion site.  The catheter was cut to the appropriate length using fluoroscopic guidance.  Using fluoroscopic guidance, we passed the dilator and breakaway sheath over the wire.  The wire and dilator were removed.  The catheter was then advanced through the sheath which was removed.  Fluoroscopy confirmed that there were no kinks along the length of the catheter.  We are able to aspirate blood easily through the port and were able to flush easily.  The port was secured with two interrupted 2-0 Prolene sutures.  3-0 Vicryl was used to close the subcutaneous tissue and 4-0 Monocryl was used to close the skin at both sites.   ? ?We then turned our attention to the lumpectomy.  We anesthetized the old lumpectomy incision in the right inframammary crease with local anesthetic.  I opened the incision.  We dissected into the seroma cavity.  I excised the anterior medial margin with an additional 1 cm of tissue.  This was oriented with the paint kit.  We then excised the lateral margin which was also oriented with the paint kit.  I took another thickness of 1 cm.  We placed a new clips for orientation for radiation therapy.  We inspected carefully for hemostasis.  The wound  was irrigated.  We closed with 3-0 Vicryl and 4-0 Monocryl.  ? ?Benzoin and Steri-Strips were applied to all of the wounds..  An occlusive dressing was placed over each incision.  The patient was then extubated and brought to the recovery room in stable condition.  All sponge, instrument, and needle counts are correct.  Post-op chest x-ray is pending. ? ?Catherine [name]. Catherine Mccurdy, MD, FACS ?[facility]   ?General Surgery ? ? ?03/31/2021 ?11:11 AM ? ? ? ?   ?

## 2021-03-31 NOTE — Transfer of Care (Signed)
Immediate Anesthesia Transfer of Care Note ? ?Patient: Catherine [name] ? ?Procedure(s) Performed: RE-EXCISION OF RIGHT BREAST LUMPECTOMY MARGINS (Right: Breast) ?INSERTION PORT-A-CATH ? ?Patient Location: PACU ? ?Anesthesia Type:General ? ?Level of Consciousness: drowsy and patient cooperative ? ?Airway & Oxygen Therapy: Patient Spontanous Breathing and Patient connected to nasal cannula oxygen ? ?Post-op Assessment: Report given to RN, Post -op Vital signs reviewed and stable and Patient moving all extremities X 4 ? ?Post vital signs: Reviewed and stable ? ?Last Vitals:  ?Vitals Value Taken Time  ?BP 137/97 03/31/21 1130  ?Temp    ?Pulse 83 03/31/21 1131  ?Resp 20 03/31/21 1130  ?SpO2 100 % 03/31/21 1131  ?Vitals shown include unvalidated device data. ? ?Last Pain:  ?Vitals:  ? 03/31/21 0808  ?TempSrc:   ?PainSc: 3   ?   ? ?Patients Stated Pain Goal: 0 (03/31/21 4037) ? ?Complications: No notable events documented. ?

## 2021-03-31 NOTE — Anesthesia Procedure Notes (Signed)
Procedure Name: LMA Insertion ?Date/Time: 03/31/2021 10:06 AM ?Performed by: Michele Rockers, CRNA ?Pre-anesthesia Checklist: Patient identified, Emergency Drugs available, Suction available, Timeout performed and Patient being monitored ?Patient Re-evaluated:Patient Re-evaluated prior to induction ?Oxygen Delivery Method: Circle system utilized ?Preoxygenation: Pre-oxygenation with 100% oxygen ?Induction Type: IV induction and Inhalational induction ?Ventilation: Mask ventilation without difficulty ?LMA: LMA inserted ?LMA Size: 4.0 ?Number of attempts: 1 ?Placement Confirmation: breath sounds checked- equal and bilateral and positive ETCO2 ?Tube secured with: Tape ?Dental Injury: Teeth and Oropharynx as per pre-operative assessment  ? ? ? ? ?

## 2021-03-31 NOTE — Anesthesia Postprocedure Evaluation (Signed)
Anesthesia Post Note ? ?Patient: Catherine [name] ? ?Procedure(s) Performed: RE-EXCISION OF RIGHT BREAST LUMPECTOMY MARGINS (Right: Breast) ?INSERTION PORT-A-CATH ? ?  ? ?Patient location during evaluation: PACU ?Anesthesia Type: General ?Level of consciousness: awake and alert, oriented and patient cooperative ?Pain management: pain level controlled ?Vital Signs Assessment: post-procedure vital signs reviewed and stable ?Respiratory status: spontaneous breathing, nonlabored ventilation and respiratory function stable ?Cardiovascular status: blood pressure returned to baseline and stable ?Postop Assessment: no apparent nausea or vomiting ?Anesthetic complications: no ? ? ?No notable events documented. ? ?Last Vitals:  ?Vitals:  ? 03/31/21 1130 03/31/21 1210  ?BP: (!) 137/97 (!) 151/91  ?Pulse: 83 72  ?Resp: 20 14  ?Temp: (!) 36.2 ?C (!) 36.3 ?C  ?SpO2: 100% 97%  ?  ?Last Pain:  ?Vitals:  ? 03/31/21 1210  ?TempSrc:   ?PainSc: Asleep  ? ? ?  ?  ?  ?  ?  ?  ? ?Chey Cho,E. Lanesha Azzaro ? ? ? ? ?

## 2021-03-31 NOTE — Anesthesia Preprocedure Evaluation (Addendum)
Anesthesia Evaluation  ?Patient identified by MRN, date of birth, ID band ?Patient awake ? ? ? ?Reviewed: ?Allergy & Precautions, NPO status , Patient's Chart, lab work & pertinent test results, reviewed documented beta blocker date and time  ? ?History of Anesthesia Complications ?Negative for: history of anesthetic complications ? ?Airway ?Mallampati: II ? ?TM Distance: >3 FB ?Neck ROM: Full ? ? ? Dental ? ?(+) Teeth Intact, Dental Advisory Given, Missing ?  ?Pulmonary ?neg pulmonary ROS,  ?  ?breath sounds clear to auscultation ? ? ? ? ? ? Cardiovascular ?hypertension, Pt. on medications and Pt. on home beta blockers ?(-) angina ?Rhythm:Regular Rate:Normal ? ? ?  ?Neuro/Psych ?Anxiety negative neurological ROS ?   ? GI/Hepatic ?negative GI ROS, Neg liver ROS,   ?Endo/Other  ?Hypothyroidism  ? Renal/GU ?negative Renal ROS  ? ?  ?Musculoskeletal ? ? Abdominal ?(+) + obese,   ?Peds ? Hematology ?negative hematology ROS ?(+)   ?Anesthesia Other Findings ?Breast cancer ? Reproductive/Obstetrics ? ?  ? ? ? ? ? ? ? ? ? ? ? ? ? ?  ?  ? ? ? ? ? ? ? ?Anesthesia Physical ?Anesthesia Plan ? ?ASA: 2 ? ?Anesthesia Plan: General  ? ?Post-op Pain Management: Tylenol PO (pre-op)* and Minimal or no pain anticipated  ? ?Induction: Intravenous ? ?PONV Risk Score and Plan: 3 and Ondansetron, Dexamethasone and Treatment may vary due to age or medical condition ? ?Airway Management Planned: LMA ? ?Additional Equipment: None ? ?Intra-op Plan:  ? ?Post-operative Plan:  ? ?Informed Consent: I have reviewed the patients History and Physical, chart, labs and discussed the procedure including the risks, benefits and alternatives for the proposed anesthesia with the patient or authorized representative who has indicated his/her understanding and acceptance.  ? ? ? ?Dental advisory given ? ?Plan Discussed with: CRNA and Surgeon ? ?Anesthesia Plan Comments:   ? ? ? ? ? ?Anesthesia Quick Evaluation ? ?

## 2021-04-01 ENCOUNTER — Encounter (HOSPITAL_COMMUNITY): Payer: Self-pay | Admitting: Surgery

## 2021-04-01 LAB — SURGICAL PATHOLOGY

## 2021-04-02 ENCOUNTER — Encounter: Payer: Self-pay | Admitting: *Deleted

## 2021-04-09 NOTE — Progress Notes (Signed)
? ?Patient Care Team: ?Harlan Stains, MD as PCP - General (Family Medicine) ?Mauro Kaufmann, RN as Oncology Nurse Navigator ?Rockwell Germany, RN as Oncology Nurse Navigator ? ?DIAGNOSIS:  ?Encounter Diagnosis  ?Name Primary?  ? Malignant neoplasm of lower-outer quadrant of right breast of female, estrogen receptor positive ([place])   ? ? ?SUMMARY OF ONCOLOGIC HISTORY: ?Oncology History  ?Malignant neoplasm of lower-outer quadrant of right breast of female, estrogen receptor positive ([place])  ?01/29/2021 Initial Diagnosis  ? Screening mammogram detected right breast mass 1.2 cm, axilla normal, biopsy revealed grade 2 IDC with DCIS ER 90%, PR 30%, HER2 2+ by IHC FISH negative ?  ?02/08/2021 Cancer Staging  ? Staging form: Breast, AJCC 8th Edition ?- Clinical stage from 02/08/2021: Stage IA (cT1c, cN0, cM0, G2, ER+, PR+, HER2: Equivocal) - Signed by Nicholas Lose, MD on 02/08/2021 ?Stage prefix: Initial diagnosis ?Histologic grading system: 3 grade system ? ?  ?03/02/2021 Surgery  ? Right lumpectomy: Grade 2 IDC 1.2 cm with intermediate grade DCIS, focal involvement of anterior medial margin junction, 1/4 lymph nodes positive ?  ?03/19/2021 Oncotype testing  ? Oncotype DX score: 28.  Distant recurrence at 10 years: 17% ?  ?03/24/2021 Cancer Staging  ? Staging form: Breast, AJCC 8th Edition ?- Pathologic: Stage IA (pT1c, pN1(sn), cM0, G2, ER+, PR+, HER2-, Oncotype DX score: 28) - Signed by Nicholas Lose, MD on 03/24/2021 ?Method of lymph node assessment: Sentinel lymph node biopsy ?Multigene prognostic tests performed: Oncotype DX ?Recurrence score range: Greater than or equal to 11 ?Histologic grading system: 3 grade system ? ?  ?04/23/2021 -  Chemotherapy  ? Patient is on Treatment Plan : BREAST TC q21d  ? ?  ?  ? ? ?CHIEF COMPLIANT: Cycle 1 Cytoxan Taxotere ? ?INTERVAL HISTORY: Catherine [name] is a ?66 y.o. 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 She presents to the  clinic today for a follow-up and treatment. She states that she stopped the Letrozole. ? ?ALLERGIES:  is allergic to penicillins. ? ?MEDICATIONS:  ?Current Outpatient Medications  ?Medication Sig Dispense Refill  ? acetaminophen (TYLENOL) 500 MG tablet Take 1,000 mg by mouth every 6 (six) hours as needed for moderate pain.    ? Calcium Magnesium Zinc 333-133-5 MG TABS Take 1 tablet by mouth daily. (Patient taking differently: Take 1 tablet by mouth once a week.)    ? dexamethasone (DECADRON) 4 MG tablet Take 1 tablet (4 mg total) by mouth daily. Take 1 tablet day before chemo and 1 tablet day after chemo with food 8 tablet 0  ? diphenhydrAMINE (BENADRYL) 25 MG tablet Take 25 mg by mouth every 6 (six) hours as needed for allergies.    ? ergocalciferol (VITAMIN D2) 1.25 MG (50000 UT) capsule Take 1 capsule (50,000 Units total) by mouth once a week.    ? escitalopram (LEXAPRO) 5 MG tablet Take 5 mg by mouth once a week.    ? levothyroxine (SYNTHROID) 50 MCG tablet Take 1 tablet (50 mcg total) by mouth daily before breakfast.    ? lidocaine-prilocaine (EMLA) cream Apply to affected area once 30 g 3  ? metoprolol tartrate (LOPRESSOR) 25 MG tablet Take 12.5 mg by mouth 2 (two) times daily.    ? ondansetron (ZOFRAN) 8 MG tablet Take 1 tablet (8 mg total) by mouth 2 (two) times daily as needed for refractory nausea / vomiting. Start on day 3 after chemo. 30 tablet 1  ? prochlorperazine (  COMPAZINE) 10 MG tablet TAKE 1 TABLET(10 MG) BY MOUTH EVERY 6 HOURS AS NEEDED FOR NAUSEA OR VOMITING 30 tablet 1  ? rosuvastatin (CRESTOR) 5 MG tablet Take 1 tablet (5 mg total) by mouth daily.    ? ?No current facility-administered medications for this visit.  ? ? ?PHYSICAL EXAMINATION: ?ECOG PERFORMANCE STATUS: 1 - Symptomatic but completely ambulatory ? ?Vitals:  ? 04/23/21 0935  ?BP: (!) 160/87  ?Pulse: 69  ?Resp: 17  ?Temp: 97.7 ?F (36.5 ?C)  ?SpO2: 100%  ? ?Filed Weights  ? 04/23/21 0935  ?Weight: 186 lb 14.4 oz (84.8 kg)  ? ?   ? ?LABORATORY DATA:  ?I have reviewed the data as listed ? ?  Latest Ref Rng & Units 04/23/2021  ?  9:03 AM 03/31/2021  ?  8:25 AM 02/25/2021  ?  9:36 AM  ?CMP  ?Glucose 70 - 99 mg/dL 110   89   88    ?BUN 8 - 23 mg/dL '18   14   10    ' ?Creatinine 0.44 - 1.00 mg/dL 0.89   1.06   0.95    ?Sodium 135 - 145 mmol/L 138   139   137    ?Potassium 3.5 - 5.1 mmol/L 4.0   4.5   3.9    ?Chloride 98 - 111 mmol/L 106   106   103    ?CO2 22 - 32 mmol/L '25   25   27    ' ?Calcium 8.9 - 10.3 mg/dL 9.4   9.6   8.9    ?Total Protein 6.5 - 8.1 g/dL 7.5      ?Total Bilirubin 0.3 - 1.2 mg/dL 0.4      ?Alkaline Phos 38 - 126 U/L 95      ?AST 15 - 41 U/L 15      ?ALT 0 - 44 U/L 12      ? ? ?Lab Results  ?Component Value Date  ? WBC 5.3 04/23/2021  ? HGB 11.8 (L) 04/23/2021  ? HCT 35.0 (L) 04/23/2021  ? MCV 83.5 04/23/2021  ? PLT 194 04/23/2021  ? NEUTROABS 3.6 04/23/2021  ? ? ?ASSESSMENT & PLAN:  ?Malignant neoplasm of lower-outer quadrant of right breast of female, estrogen receptor positive ([place]) ?01/29/2021: Screening mammogram detected 1.2 cm right breast mass: Grade 2 IDC with DCIS ER 90%, PR 30%, HER2 negative ?Oncotype score: 28: 17% risk of distant recurrence at 10 years ?  ?  ?Treatment plan: ?1.  Breast conserving surgery with sentinel lymph node biopsy  ?03/02/2021: Right lumpectomy: Grade 2 IDC 1.2 cm, intermediate grade DCIS, focal anterior and medial margin positive, 1/4 lymph nodes positive ?03/31/2021: Reexcision margins: Benign ?2. Adjuvant chemotherapy with Taxotere and Cytoxan every 3 weeks x4 cycles ?3.  Adjuvant radiation ?4.  Followed by adjuvant antiestrogen therapy ?-------------------------------------------------------------------------------------------------------------------------------- ? ?Current treatment: Cycle 1 day 1 Taxotere Cytoxan ?Labs reviewed, chemo education completed, chemo consent obtained, antiemetics were reviewed. ? ?Return to clinic in 1 week for toxicity check ? ? ? ?No orders of the defined types  were placed in this encounter. ? ?The patient has a good understanding of the overall plan. she agrees with it. she will call with any problems that may develop before the next visit here. ?Total time spent: 30 mins including face to face time and time spent for planning, charting and co-ordination of care ? ? Harriette Ohara, MD ?04/23/21 ? ? ?  ?

## 2021-04-13 MED FILL — Fosaprepitant Dimeglumine For IV Infusion 150 MG (Base Eq): INTRAVENOUS | Qty: 5 | Status: AC

## 2021-04-13 MED FILL — Dexamethasone Sodium Phosphate Inj 100 MG/10ML: INTRAMUSCULAR | Qty: 1 | Status: AC

## 2021-04-14 ENCOUNTER — Other Ambulatory Visit: Payer: Self-pay

## 2021-04-14 ENCOUNTER — Inpatient Hospital Stay: Payer: Medicare PPO | Attending: Hematology and Oncology

## 2021-04-14 ENCOUNTER — Other Ambulatory Visit: Payer: Self-pay | Admitting: Hematology and Oncology

## 2021-04-14 DIAGNOSIS — C50511 Malignant neoplasm of lower-outer quadrant of right female breast: Secondary | ICD-10-CM

## 2021-04-14 DIAGNOSIS — R112 Nausea with vomiting, unspecified: Secondary | ICD-10-CM | POA: Insufficient documentation

## 2021-04-14 DIAGNOSIS — Z5189 Encounter for other specified aftercare: Secondary | ICD-10-CM | POA: Insufficient documentation

## 2021-04-14 DIAGNOSIS — Z170 Estrogen receptor positive status [ER+]: Secondary | ICD-10-CM | POA: Insufficient documentation

## 2021-04-14 DIAGNOSIS — Z79899 Other long term (current) drug therapy: Secondary | ICD-10-CM | POA: Insufficient documentation

## 2021-04-14 DIAGNOSIS — R5383 Other fatigue: Secondary | ICD-10-CM | POA: Insufficient documentation

## 2021-04-14 DIAGNOSIS — R197 Diarrhea, unspecified: Secondary | ICD-10-CM | POA: Insufficient documentation

## 2021-04-14 DIAGNOSIS — Z5111 Encounter for antineoplastic chemotherapy: Secondary | ICD-10-CM | POA: Insufficient documentation

## 2021-04-16 NOTE — Progress Notes (Signed)
Pharmacist Chemotherapy Monitoring - Initial Assessment   ? ?Anticipated start date: 04/23/21  ? ?The following has been reviewed per standard work regarding the patient's treatment regimen: ?The patient's diagnosis, treatment plan and drug doses, and organ/hematologic function ?Lab orders and baseline tests specific to treatment regimen  ?The treatment plan start date, drug sequencing, and pre-medications ?Prior authorization status  ?Patient's documented medication list, including drug-drug interaction screen and prescriptions for anti-emetics and supportive care specific to the treatment regimen ?The drug concentrations, fluid compatibility, administration routes, and timing of the medications to be used ?The patient's access for treatment and lifetime cumulative dose history, if applicable  ?The patient's medication allergies and previous infusion related reactions, if applicable  ? ?Changes made to treatment plan:  ?treatment plan date ? ?Follow up needed:  ?N/A ? ? ?Wynona Neat, [facility], ?04/16/2021  2:49 PM ? ?

## 2021-04-16 NOTE — Progress Notes (Signed)
? ?Patient Care Team: ?Harlan Stains, MD as PCP - General (Family Medicine) ?Mauro Kaufmann, RN as Oncology Nurse Navigator ?Rockwell Germany, RN as Oncology Nurse Navigator ? ?DIAGNOSIS:  ?Encounter Diagnosis  ?Name Primary?  ? Malignant neoplasm of lower-outer quadrant of right breast of female, estrogen receptor positive ([place])   ? ? ?SUMMARY OF ONCOLOGIC HISTORY: ?Oncology History  ?Malignant neoplasm of lower-outer quadrant of right breast of female, estrogen receptor positive ([place])  ?01/29/2021 Initial Diagnosis  ? Screening mammogram detected right breast mass 1.2 cm, axilla normal, biopsy revealed grade 2 IDC with DCIS ER 90%, PR 30%, HER2 2+ by IHC FISH negative ?  ?02/08/2021 Cancer Staging  ? Staging form: Breast, AJCC 8th Edition ?- Clinical stage from 02/08/2021: Stage IA (cT1c, cN0, cM0, G2, ER+, PR+, HER2: Equivocal) - Signed by Nicholas Lose, MD on 02/08/2021 ?Stage prefix: Initial diagnosis ?Histologic grading system: 3 grade system ? ?  ?03/02/2021 Surgery  ? Right lumpectomy: Grade 2 IDC 1.2 cm with intermediate grade DCIS, focal involvement of anterior medial margin junction, 1/4 lymph nodes positive ?  ?03/19/2021 Oncotype testing  ? Oncotype DX score: 28.  Distant recurrence at 10 years: 17% ?  ?03/24/2021 Cancer Staging  ? Staging form: Breast, AJCC 8th Edition ?- Pathologic: Stage IA (pT1c, pN1(sn), cM0, G2, ER+, PR+, HER2-, Oncotype DX score: 28) - Signed by Nicholas Lose, MD on 03/24/2021 ?Method of lymph node assessment: Sentinel lymph node biopsy ?Multigene prognostic tests performed: Oncotype DX ?Recurrence score range: Greater than or equal to 11 ?Histologic grading system: 3 grade system ? ?  ?04/23/2021 -  Chemotherapy  ? Patient is on Treatment Plan : BREAST TC q21d  ? ?  ?  ? ? ?CHIEF COMPLIANT: Cycle 1 day 8 Taxotere and Cytoxan ? ?INTERVAL HISTORY: Catherine [name] is a 66 y.o. 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 She presents to  the clinic today for TC check and a follow-up. She states she has been dizzy having some nausea and fatigue. She complains of not able to sleep. She complains of diarrhea vomiting and have not had an appetite. She states that the diarrhea is the worst. She vomited 3 times yesterday. ?She complains of back pain from the injection but she put a heating pad on it last night. ? ?ALLERGIES:  is allergic to penicillins. ? ?MEDICATIONS:  ?Current Outpatient Medications  ?Medication Sig Dispense Refill  ? acetaminophen (TYLENOL) 500 MG tablet Take 1,000 mg by mouth every 6 (six) hours as needed for moderate pain.    ? Calcium Magnesium Zinc 333-133-5 MG TABS Take 1 tablet by mouth daily. (Patient taking differently: Take 1 tablet by mouth once a week.)    ? dexamethasone (DECADRON) 4 MG tablet Take 1 tablet (4 mg total) by mouth daily. Take 1 tablet day before chemo and 1 tablet day after chemo with food 8 tablet 0  ? diphenhydrAMINE (BENADRYL) 25 MG tablet Take 25 mg by mouth every 6 (six) hours as needed for allergies.    ? ergocalciferol (VITAMIN D2) 1.25 MG (50000 UT) capsule Take 1 capsule (50,000 Units total) by mouth once a week.    ? escitalopram (LEXAPRO) 5 MG tablet Take 5 mg by mouth once a week.    ? levothyroxine (SYNTHROID) 50 MCG tablet Take 1 tablet (50 mcg total) by mouth daily before breakfast.    ? lidocaine-prilocaine (EMLA) cream Apply to affected area once 30 g 3  ?  metoprolol tartrate (LOPRESSOR) 25 MG tablet Take 12.5 mg by mouth 2 (two) times daily.    ? ondansetron (ZOFRAN) 8 MG tablet Take 1 tablet (8 mg total) by mouth 2 (two) times daily as needed for refractory nausea / vomiting. Start on day 3 after chemo. 30 tablet 1  ? prochlorperazine (COMPAZINE) 10 MG tablet TAKE 1 TABLET(10 MG) BY MOUTH EVERY 6 HOURS AS NEEDED FOR NAUSEA OR VOMITING 30 tablet 1  ? rosuvastatin (CRESTOR) 5 MG tablet Take 1 tablet (5 mg total) by mouth daily.    ? ?Current Facility-Administered Medications  ?Medication Dose  Route Frequency Provider Last Rate Last Admin  ? ondansetron (ZOFRAN) injection 8 mg  8 mg Intravenous Once Nicholas Lose, MD      ? ?Facility-Administered Medications Ordered in Other Visits  ?Medication Dose Route Frequency Provider Last Rate Last Admin  ? 0.9 %  sodium chloride infusion   Intravenous Continuous Nicholas Lose, MD 500 mL/hr at 04/30/21 1123 New Bag at 04/30/21 1123  ? ? ?PHYSICAL EXAMINATION: ?ECOG PERFORMANCE STATUS: 1 - Symptomatic but completely ambulatory ? ?Vitals:  ? 04/30/21 1049  ?BP: 105/65  ?Pulse: (!) 107  ?Resp: 16  ?Temp: 97.7 ?F (36.5 ?C)  ?SpO2: 99%  ? ?Filed Weights  ? 04/30/21 1049  ?Weight: 180 lb 11.2 oz (82 kg)  ? ?  ? ?LABORATORY DATA:  ?I have reviewed the data as listed ? ?  Latest Ref Rng & Units 04/30/2021  ? 10:23 AM 04/23/2021  ?  9:03 AM 03/31/2021  ?  8:25 AM  ?CMP  ?Glucose 70 - 99 mg/dL 133   110   89    ?BUN 8 - 23 mg/dL '12   18   14    ' ?Creatinine 0.44 - 1.00 mg/dL 1.26   0.89   1.06    ?Sodium 135 - 145 mmol/L 136   138   139    ?Potassium 3.5 - 5.1 mmol/L 3.5   4.0   4.5    ?Chloride 98 - 111 mmol/L 101   106   106    ?CO2 22 - 32 mmol/L '27   25   25    ' ?Calcium 8.9 - 10.3 mg/dL 9.3   9.4   9.6    ?Total Protein 6.5 - 8.1 g/dL 7.0   7.5     ?Total Bilirubin 0.3 - 1.2 mg/dL 0.5   0.4     ?Alkaline Phos 38 - 126 U/L 85   95     ?AST 15 - 41 U/L 20   15     ?ALT 0 - 44 U/L 21   12     ? ? ?Lab Results  ?Component Value Date  ? WBC 3.4 (L) 04/30/2021  ? HGB 12.4 04/30/2021  ? HCT 36.5 04/30/2021  ? MCV 82.6 04/30/2021  ? PLT 134 (L) 04/30/2021  ? NEUTROABS 1.4 (L) 04/30/2021  ? ? ?ASSESSMENT & PLAN:  ?Malignant neoplasm of lower-outer quadrant of right breast of female, estrogen receptor positive ([place]) ?01/29/2021: Screening mammogram detected 1.2 cm right breast mass: Grade 2 IDC with DCIS ER 90%, PR 30%, HER2 negative ?Oncotype score: 28: 17% risk of distant recurrence at 10 years ?  ?  ?Treatment plan: ?1.  Breast conserving surgery with sentinel lymph node biopsy   ?03/02/2021: Right lumpectomy: Grade 2 IDC 1.2 cm, intermediate grade DCIS, focal anterior and medial margin positive, 1/4 lymph nodes positive ?03/31/2021: Reexcision margins: Benign ?2. Adjuvant chemotherapy with Taxotere  and Cytoxan every 3 weeks x4 cycles ?3.  Adjuvant radiation ?4.  Followed by adjuvant antiestrogen therapy ?--------------------------------------------------------------------------------------------------------------------------------  ?Current treatment: Cycle 1 day 8 Taxotere Cytoxan ? ?Chemotoxicities: ?Severe nausea and vomiting: We will give her IV fluids and Zofran today. ?Diarrhea: ?Severe fatigue ?Decreased appetite and lack of taste ? ?We will reduce the dosage of cycle 2 of chemotherapy. ?  ?Return to clinic in 2 weeks for cycle 2 ? ? ? ?No orders of the defined types were placed in this encounter. ? ?The patient has a good understanding of the overall plan. she agrees with it. she will call with any problems that may develop before the next visit here. ?Total time spent: 30 mins including face to face time and time spent for planning, charting and co-ordination of care ? ? Harriette Ohara, MD ?04/30/21 ? ? ? I Gardiner Coins am scribing for Dr. Lindi Adie ? ?I have reviewed the above documentation for accuracy and completeness, and I agree with the above. ?. ?

## 2021-04-19 ENCOUNTER — Other Ambulatory Visit: Payer: Self-pay | Admitting: *Deleted

## 2021-04-19 DIAGNOSIS — C50511 Malignant neoplasm of lower-outer quadrant of right female breast: Secondary | ICD-10-CM

## 2021-04-19 MED ORDER — DEXAMETHASONE 4 MG PO TABS: 4.0000 mg | ORAL_TABLET | Freq: Every day | ORAL | 0 refills | Status: DC

## 2021-04-22 ENCOUNTER — Telehealth: Payer: Self-pay | Admitting: *Deleted

## 2021-04-22 ENCOUNTER — Encounter: Payer: Self-pay | Admitting: *Deleted

## 2021-04-22 DIAGNOSIS — Z95828 Presence of other vascular implants and grafts: Secondary | ICD-10-CM | POA: Insufficient documentation

## 2021-04-22 NOTE — Telephone Encounter (Signed)
Spoke to pt concerning pre-med prior to chemo as well as emla cream and anti-nausea medications after chemo. No further needs voiced. Encourage pt to call with needs. Received verbal understanding. ?

## 2021-04-23 ENCOUNTER — Other Ambulatory Visit: Payer: Self-pay

## 2021-04-23 ENCOUNTER — Inpatient Hospital Stay: Payer: Medicare PPO

## 2021-04-23 ENCOUNTER — Inpatient Hospital Stay: Payer: Medicare PPO | Admitting: Hematology and Oncology

## 2021-04-23 ENCOUNTER — Encounter: Payer: Self-pay | Admitting: *Deleted

## 2021-04-23 VITALS — BP 145/70 | HR 66 | Temp 98.0°F | Resp 16

## 2021-04-23 DIAGNOSIS — C50511 Malignant neoplasm of lower-outer quadrant of right female breast: Secondary | ICD-10-CM

## 2021-04-23 DIAGNOSIS — Z170 Estrogen receptor positive status [ER+]: Secondary | ICD-10-CM | POA: Diagnosis not present

## 2021-04-23 DIAGNOSIS — Z5111 Encounter for antineoplastic chemotherapy: Secondary | ICD-10-CM | POA: Diagnosis not present

## 2021-04-23 DIAGNOSIS — Z79899 Other long term (current) drug therapy: Secondary | ICD-10-CM | POA: Diagnosis not present

## 2021-04-23 DIAGNOSIS — R5383 Other fatigue: Secondary | ICD-10-CM | POA: Diagnosis not present

## 2021-04-23 DIAGNOSIS — R112 Nausea with vomiting, unspecified: Secondary | ICD-10-CM | POA: Diagnosis not present

## 2021-04-23 DIAGNOSIS — R197 Diarrhea, unspecified: Secondary | ICD-10-CM | POA: Diagnosis not present

## 2021-04-23 DIAGNOSIS — Z5189 Encounter for other specified aftercare: Secondary | ICD-10-CM | POA: Diagnosis not present

## 2021-04-23 DIAGNOSIS — Z95828 Presence of other vascular implants and grafts: Secondary | ICD-10-CM

## 2021-04-23 LAB — CBC WITH DIFFERENTIAL (CANCER CENTER ONLY)
Abs Immature Granulocytes: 0.02 10*3/uL (ref 0.00–0.07)
Basophils Absolute: 0 10*3/uL (ref 0.0–0.1)
Basophils Relative: 0 %
Eosinophils Absolute: 0 10*3/uL (ref 0.0–0.5)
Eosinophils Relative: 0 %
HCT: 35 % — ABNORMAL LOW (ref 36.0–46.0)
Hemoglobin: 11.8 g/dL — ABNORMAL LOW (ref 12.0–15.0)
Immature Granulocytes: 0 %
Lymphocytes Relative: 24 %
Lymphs Abs: 1.3 10*3/uL (ref 0.7–4.0)
MCH: 28.2 pg (ref 26.0–34.0)
MCHC: 33.7 g/dL (ref 30.0–36.0)
MCV: 83.5 fL (ref 80.0–100.0)
Monocytes Absolute: 0.4 10*3/uL (ref 0.1–1.0)
Monocytes Relative: 7 %
Neutro Abs: 3.6 10*3/uL (ref 1.7–7.7)
Neutrophils Relative %: 69 %
Platelet Count: 194 10*3/uL (ref 150–400)
RBC: 4.19 MIL/uL (ref 3.87–5.11)
RDW: 13.2 % (ref 11.5–15.5)
WBC Count: 5.3 10*3/uL (ref 4.0–10.5)
nRBC: 0 % (ref 0.0–0.2)

## 2021-04-23 LAB — CMP (CANCER CENTER ONLY)
ALT: 12 U/L (ref 0–44)
AST: 15 U/L (ref 15–41)
Albumin: 4.1 g/dL (ref 3.5–5.0)
Alkaline Phosphatase: 95 U/L (ref 38–126)
Anion gap: 7 (ref 5–15)
BUN: 18 mg/dL (ref 8–23)
CO2: 25 mmol/L (ref 22–32)
Calcium: 9.4 mg/dL (ref 8.9–10.3)
Chloride: 106 mmol/L (ref 98–111)
Creatinine: 0.89 mg/dL (ref 0.44–1.00)
GFR, Estimated: >60 mL/min (ref >60)
Glucose, Bld: 110 mg/dL — ABNORMAL HIGH (ref 70–99)
Potassium: 4 mmol/L (ref 3.5–5.1)
Sodium: 138 mmol/L (ref 135–145)
Total Bilirubin: 0.4 mg/dL (ref 0.3–1.2)
Total Protein: 7.5 g/dL (ref 6.5–8.1)

## 2021-04-23 MED ORDER — SODIUM CHLORIDE 0.9 % IV SOLN
600.0000 mg/m2 | Freq: Once | INTRAVENOUS | Status: AC
  Administered 2021-04-23: 1180 mg via INTRAVENOUS
  Filled 2021-04-23: qty 59

## 2021-04-23 MED ORDER — SODIUM CHLORIDE 0.9 % IV SOLN
75.0000 mg/m2 | Freq: Once | INTRAVENOUS | Status: AC
  Administered 2021-04-23: 150 mg via INTRAVENOUS
  Filled 2021-04-23: qty 15

## 2021-04-23 MED ORDER — SODIUM CHLORIDE 0.9 % IV SOLN
10.0000 mg | Freq: Once | INTRAVENOUS | Status: AC
  Administered 2021-04-23: 10 mg via INTRAVENOUS
  Filled 2021-04-23: qty 10

## 2021-04-23 MED ORDER — PALONOSETRON HCL INJECTION 0.25 MG/5ML
0.2500 mg | Freq: Once | INTRAVENOUS | Status: AC
  Administered 2021-04-23: 0.25 mg via INTRAVENOUS
  Filled 2021-04-23: qty 5

## 2021-04-23 MED ORDER — HEPARIN SOD (PORK) LOCK FLUSH 100 UNIT/ML IV SOLN
500.0000 [IU] | Freq: Once | INTRAVENOUS | Status: AC | PRN
  Administered 2021-04-23: 500 [IU]

## 2021-04-23 MED ORDER — SODIUM CHLORIDE 0.9 % IV SOLN
150.0000 mg | Freq: Once | INTRAVENOUS | Status: AC
  Administered 2021-04-23: 150 mg via INTRAVENOUS
  Filled 2021-04-23: qty 150

## 2021-04-23 MED ORDER — SODIUM CHLORIDE 0.9% FLUSH
10.0000 mL | INTRAVENOUS | Status: DC | PRN
  Administered 2021-04-23: 10 mL

## 2021-04-23 MED ORDER — SODIUM CHLORIDE 0.9 % IV SOLN: Freq: Once | INTRAVENOUS | Status: AC

## 2021-04-23 MED ORDER — SODIUM CHLORIDE 0.9% FLUSH
10.0000 mL | Freq: Once | INTRAVENOUS | Status: AC
  Administered 2021-04-23: 10 mL

## 2021-04-23 NOTE — Patient Instructions (Signed)
[facility]  ? Discharge Instructions: ?Thank you for choosing [facility] to provide your oncology and hematology care.  ? ?If you have a lab appointment with the [facility], please go directly to the [facility] and check in at the registration area. ?  ?Wear comfortable clothing and clothing appropriate for easy access to any Portacath or PICC line.  ? ?We strive to give you quality time with your provider. You may need to reschedule your appointment if you arrive late (15 or more minutes).  Arriving late affects you and other patients whose appointments are after yours.  Also, if you miss three or more appointments without notifying the office, you may be dismissed from the clinic at the provider?s discretion.    ?  ?For prescription refill requests, have your pharmacy contact our office and allow 72 hours for refills to be completed.   ? ?Today you received the following chemotherapy and/or immunotherapy agents: docetaxel and cyclophosphamide    ?  ?To help prevent nausea and vomiting after your treatment, we encourage you to take your nausea medication as directed. ? ?BELOW ARE SYMPTOMS THAT SHOULD BE REPORTED IMMEDIATELY: ?*FEVER GREATER THAN 100.4 F (38 ?C) OR HIGHER ?*CHILLS OR SWEATING ?*NAUSEA AND VOMITING THAT IS NOT CONTROLLED WITH YOUR NAUSEA MEDICATION ?*UNUSUAL SHORTNESS OF BREATH ?*UNUSUAL BRUISING OR BLEEDING ?*URINARY PROBLEMS (pain or burning when urinating, or frequent urination) ?*BOWEL PROBLEMS (unusual diarrhea, constipation, pain near the anus) ?TENDERNESS IN MOUTH AND THROAT WITH OR WITHOUT PRESENCE OF ULCERS (sore throat, sores in mouth, or a toothache) ?UNUSUAL RASH, SWELLING OR PAIN  ?UNUSUAL VAGINAL DISCHARGE OR ITCHING  ? ?Items with * indicate a potential emergency and should be followed up as soon as possible or go to the Emergency Department if any problems should occur. ? ?Please show the CHEMOTHERAPY ALERT CARD or IMMUNOTHERAPY  ALERT CARD at check-in to the Emergency Department and triage nurse. ? ?Should you have questions after your visit or need to cancel or reschedule your appointment, please contact [facility]  Dept: [phone]  and follow the prompts.  Office hours are 8:00 a.m. to 4:30 p.m. Monday - Friday. Please note that voicemails left after 4:00 p.m. may not be returned until the following business day.  We are closed weekends and major holidays. You have access to a nurse at all times for urgent questions. Please call the main number to the clinic Dept: [phone] and follow the prompts. ? ? ?For any non-urgent questions, you may also contact your provider using MyChart. We now offer e-Visits for anyone 44 and older to request care online for non-urgent symptoms. For details visit [email or web address]. ?  ?Also download the MyChart app! Go to the app store, search "MyChart", open the app, select [facility], and log in with your MyChart username and password. ? ?Due to Covid, a mask is required upon entering the hospital/clinic. If you do not have a mask, one will be given to you upon arrival. For doctor visits, patients may have 1 support person aged 22 or older with them. For treatment visits, patients cannot have anyone with them due to current Covid guidelines and our immunocompromised population.  ? ?Docetaxel injection ?What is this medication? ?DOCETAXEL (doe se TAX el) is a chemotherapy drug. It targets fast dividing cells, like cancer cells, and causes these cells to die. This medicine is used to treat many types of cancers like breast cancer, certain stomach cancers, head  and neck cancer, lung cancer, and prostate cancer. ?This medicine may be used for other purposes; ask your health care provider or pharmacist if you have questions. ?COMMON BRAND NAME(S): Docefrez, Taxotere ?What should I tell my care team before I take this medication? ?They need to know if you have any of  these conditions: ?infection (especially a virus infection such as chickenpox, cold sores, or herpes) ?liver disease ?low blood counts, like low white cell, platelet, or red cell counts ?an unusual or allergic reaction to docetaxel, polysorbate 80, other chemotherapy agents, other medicines, foods, dyes, or preservatives ?pregnant or trying to get pregnant ?breast-feeding ?How should I use this medication? ?This drug is given as an infusion into a vein. It is administered in a hospital or clinic by a specially trained health care professional. ?Talk to your pediatrician regarding the use of this medicine in children. Special care may be needed. ?Overdosage: If you think you have taken too much of this medicine contact a poison control center or emergency room at once. ?NOTE: This medicine is only for you. Do not share this medicine with others. ?What if I miss a dose? ?It is important not to miss your dose. Call your doctor or health care professional if you are unable to keep an appointment. ?What may interact with this medication? ?Do not take this medicine with any of the following medications: ?live virus vaccines ?This medicine may also interact with the following medications: ?aprepitant ?certain antibiotics like erythromycin or clarithromycin ?certain antivirals for HIV or hepatitis ?certain medicines for fungal infections like fluconazole, itraconazole, ketoconazole, posaconazole, or voriconazole ?cimetidine ?ciprofloxacin ?conivaptan ?cyclosporine ?dronedarone ?fluvoxamine ?grapefruit juice ?imatinib ?verapamil ?This list may not describe all possible interactions. Give your health care provider a list of all the medicines, herbs, non-prescription drugs, or dietary supplements you use. Also tell them if you smoke, drink alcohol, or use illegal drugs. Some items may interact with your medicine. ?What should I watch for while using this medication? ?Your condition will be monitored carefully while you are  receiving this medicine. You will need important blood work done while you are taking this medicine. ?Call your doctor or health care professional for advice if you get a fever, chills or sore throat, or other symptoms of a cold or flu. Do not treat yourself. This drug decreases your body's ability to fight infections. Try to avoid being around people who are sick. ?Some products may contain alcohol. Ask your health care professional if this medicine contains alcohol. Be sure to tell all health care professionals you are taking this medicine. Certain medicines, like metronidazole and disulfiram, can cause an unpleasant reaction when taken with alcohol. The reaction includes flushing, headache, nausea, vomiting, sweating, and increased thirst. The reaction can last from 30 minutes to several hours. ?You may get drowsy or dizzy. Do not drive, use machinery, or do anything that needs mental alertness until you know how this medicine affects you. Do not stand or sit up quickly, especially if you are an older patient. This reduces the risk of dizzy or fainting spells. Alcohol may interfere with the effect of this medicine. ?Talk to your health care professional about your risk of cancer. You may be more at risk for certain types of cancer if you take this medicine. ?Do not become pregnant while taking this medicine or for 6 months after stopping it. Women should inform their doctor if they wish to become pregnant or think they might be pregnant. There is a potential for serious  side effects to an unborn child. Talk to your health care professional or pharmacist for more information. Do not breast-feed an infant while taking this medicine or for 1 week after stopping it. ?Males who get this medicine must use a condom during sex with females who can get pregnant. If you get a woman pregnant, the baby could have birth defects. The baby could die before they are born. You will need to continue wearing a condom for 3 months  after stopping the medicine. Tell your health care provider right away if your partner becomes pregnant while you are taking this medicine. ?This may interfere with the ability to father a child. You should talk

## 2021-04-23 NOTE — Assessment & Plan Note (Addendum)
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  ?? ?Treatment plan: ?1.  Breast conserving surgery with sentinel lymph node biopsy  ?03/02/2021: Right lumpectomy: Grade 2 IDC 1.2 cm, intermediate grade DCIS, focal anterior and medial margin positive, 1/4 lymph nodes positive ?03/31/2021: Reexcision margins: Benign ?2. Adjuvant chemotherapy with Taxotere and Cytoxan every 3 weeks x4 cycles ?3.  Adjuvant radiation ?4.  Followed by adjuvant antiestrogen therapy ?-------------------------------------------------------------------------------------------------------------------------------- ? ?Current treatment: Cycle 1 day 1 Taxotere Cytoxan ?Labs reviewed, chemo education completed, chemo consent obtained, antiemetics were reviewed. ? ?Return to clinic in 1 week for toxicity check ?

## 2021-04-26 ENCOUNTER — Inpatient Hospital Stay (HOSPITAL_BASED_OUTPATIENT_CLINIC_OR_DEPARTMENT_OTHER): Payer: Medicare PPO

## 2021-04-26 ENCOUNTER — Other Ambulatory Visit: Payer: Self-pay

## 2021-04-26 VITALS — BP 125/76 | HR 77 | Temp 98.5°F | Resp 18

## 2021-04-26 DIAGNOSIS — C50511 Malignant neoplasm of lower-outer quadrant of right female breast: Secondary | ICD-10-CM | POA: Diagnosis not present

## 2021-04-26 DIAGNOSIS — R5383 Other fatigue: Secondary | ICD-10-CM | POA: Diagnosis not present

## 2021-04-26 DIAGNOSIS — R197 Diarrhea, unspecified: Secondary | ICD-10-CM | POA: Diagnosis not present

## 2021-04-26 DIAGNOSIS — Z79899 Other long term (current) drug therapy: Secondary | ICD-10-CM | POA: Diagnosis not present

## 2021-04-26 DIAGNOSIS — Z170 Estrogen receptor positive status [ER+]: Secondary | ICD-10-CM | POA: Diagnosis not present

## 2021-04-26 DIAGNOSIS — Z5189 Encounter for other specified aftercare: Secondary | ICD-10-CM | POA: Diagnosis not present

## 2021-04-26 DIAGNOSIS — R112 Nausea with vomiting, unspecified: Secondary | ICD-10-CM | POA: Diagnosis not present

## 2021-04-26 DIAGNOSIS — Z5111 Encounter for antineoplastic chemotherapy: Secondary | ICD-10-CM | POA: Diagnosis not present

## 2021-04-26 MED ORDER — PEGFILGRASTIM-CBQV 6 MG/0.6ML ~~LOC~~ SOSY
6.0000 mg | PREFILLED_SYRINGE | Freq: Once | SUBCUTANEOUS | Status: AC
  Administered 2021-04-26: 6 mg via SUBCUTANEOUS
  Filled 2021-04-26: qty 0.6

## 2021-04-28 ENCOUNTER — Telehealth: Payer: Self-pay

## 2021-04-28 NOTE — Telephone Encounter (Signed)
Patient called [facility] regarding question about nausea medication. Patient states she has had some nausea/vomiting and wanted to make sure she was taking her zofran/compazine correctly. RN advised patient to take zofran twice daily PRN and compazine every 6 hours PRN. RN also advised patient to drink plenty of fluids and to call if the nausea medication is not effective. Patient verbalized understanding and stated that she does not feel dizzy or dehydrated and that she is able to drink ginger ale.  ?

## 2021-04-30 ENCOUNTER — Inpatient Hospital Stay: Payer: Medicare PPO

## 2021-04-30 ENCOUNTER — Other Ambulatory Visit: Payer: Self-pay | Admitting: *Deleted

## 2021-04-30 ENCOUNTER — Inpatient Hospital Stay: Payer: Medicare PPO | Admitting: Hematology and Oncology

## 2021-04-30 ENCOUNTER — Other Ambulatory Visit: Payer: Self-pay

## 2021-04-30 VITALS — BP 136/75 | HR 90 | Resp 17

## 2021-04-30 DIAGNOSIS — Z170 Estrogen receptor positive status [ER+]: Secondary | ICD-10-CM

## 2021-04-30 DIAGNOSIS — R197 Diarrhea, unspecified: Secondary | ICD-10-CM | POA: Diagnosis not present

## 2021-04-30 DIAGNOSIS — C50511 Malignant neoplasm of lower-outer quadrant of right female breast: Secondary | ICD-10-CM

## 2021-04-30 DIAGNOSIS — Z79899 Other long term (current) drug therapy: Secondary | ICD-10-CM | POA: Diagnosis not present

## 2021-04-30 DIAGNOSIS — Z5189 Encounter for other specified aftercare: Secondary | ICD-10-CM | POA: Diagnosis not present

## 2021-04-30 DIAGNOSIS — R112 Nausea with vomiting, unspecified: Secondary | ICD-10-CM | POA: Diagnosis not present

## 2021-04-30 DIAGNOSIS — Z5111 Encounter for antineoplastic chemotherapy: Secondary | ICD-10-CM | POA: Diagnosis not present

## 2021-04-30 DIAGNOSIS — R5383 Other fatigue: Secondary | ICD-10-CM | POA: Diagnosis not present

## 2021-04-30 DIAGNOSIS — Z95828 Presence of other vascular implants and grafts: Secondary | ICD-10-CM

## 2021-04-30 LAB — CMP (CANCER CENTER ONLY)
ALT: 21 U/L (ref 0–44)
AST: 20 U/L (ref 15–41)
Albumin: 3.9 g/dL (ref 3.5–5.0)
Alkaline Phosphatase: 85 U/L (ref 38–126)
Anion gap: 8 (ref 5–15)
BUN: 12 mg/dL (ref 8–23)
CO2: 27 mmol/L (ref 22–32)
Calcium: 9.3 mg/dL (ref 8.9–10.3)
Chloride: 101 mmol/L (ref 98–111)
Creatinine: 1.26 mg/dL — ABNORMAL HIGH (ref 0.44–1.00)
GFR, Estimated: 47 mL/min — ABNORMAL LOW (ref >60)
Glucose, Bld: 133 mg/dL — ABNORMAL HIGH (ref 70–99)
Potassium: 3.5 mmol/L (ref 3.5–5.1)
Sodium: 136 mmol/L (ref 135–145)
Total Bilirubin: 0.5 mg/dL (ref 0.3–1.2)
Total Protein: 7 g/dL (ref 6.5–8.1)

## 2021-04-30 LAB — CBC WITH DIFFERENTIAL (CANCER CENTER ONLY)
Abs Immature Granulocytes: 0.32 10*3/uL — ABNORMAL HIGH (ref 0.00–0.07)
Basophils Absolute: 0 10*3/uL (ref 0.0–0.1)
Basophils Relative: 0 %
Eosinophils Absolute: 0 10*3/uL (ref 0.0–0.5)
Eosinophils Relative: 1 %
HCT: 36.5 % (ref 36.0–46.0)
Hemoglobin: 12.4 g/dL (ref 12.0–15.0)
Immature Granulocytes: 9 %
Lymphocytes Relative: 29 %
Lymphs Abs: 1 10*3/uL (ref 0.7–4.0)
MCH: 28.1 pg (ref 26.0–34.0)
MCHC: 34 g/dL (ref 30.0–36.0)
MCV: 82.6 fL (ref 80.0–100.0)
Monocytes Absolute: 0.7 10*3/uL (ref 0.1–1.0)
Monocytes Relative: 20 %
Neutro Abs: 1.4 10*3/uL — ABNORMAL LOW (ref 1.7–7.7)
Neutrophils Relative %: 41 %
Platelet Count: 134 10*3/uL — ABNORMAL LOW (ref 150–400)
RBC: 4.42 MIL/uL (ref 3.87–5.11)
RDW: 12.8 % (ref 11.5–15.5)
Smear Review: NORMAL
WBC Count: 3.4 10*3/uL — ABNORMAL LOW (ref 4.0–10.5)
nRBC: 0 % (ref 0.0–0.2)

## 2021-04-30 MED ORDER — SODIUM CHLORIDE 0.9% FLUSH
10.0000 mL | Freq: Once | INTRAVENOUS | Status: AC
  Administered 2021-04-30: 10 mL

## 2021-04-30 MED ORDER — HEPARIN SOD (PORK) LOCK FLUSH 100 UNIT/ML IV SOLN
500.0000 [IU] | Freq: Once | INTRAVENOUS | Status: AC
  Administered 2021-04-30: 500 [IU] via INTRAVENOUS

## 2021-04-30 MED ORDER — ACETAMINOPHEN 325 MG PO TABS
650.0000 mg | ORAL_TABLET | Freq: Once | ORAL | Status: AC
  Administered 2021-04-30: 650 mg via ORAL
  Filled 2021-04-30: qty 2

## 2021-04-30 MED ORDER — ONDANSETRON HCL 4 MG/2ML IJ SOLN
8.0000 mg | Freq: Once | INTRAMUSCULAR | Status: AC
  Administered 2021-04-30: 8 mg via INTRAVENOUS
  Filled 2021-04-30: qty 4

## 2021-04-30 MED ORDER — DIPHENHYDRAMINE HCL 50 MG/ML IJ SOLN
50.0000 mg | Freq: Once | INTRAMUSCULAR | Status: AC
  Administered 2021-04-30: 50 mg via INTRAVENOUS
  Filled 2021-04-30: qty 1

## 2021-04-30 MED ORDER — SODIUM CHLORIDE 0.9 % IV SOLN: 8.0000 mg | Freq: Once | INTRAVENOUS | Status: DC

## 2021-04-30 MED ORDER — SODIUM CHLORIDE 0.9% FLUSH
10.0000 mL | Freq: Once | INTRAVENOUS | Status: AC
  Administered 2021-04-30: 10 mL via INTRAVENOUS

## 2021-04-30 MED ORDER — ONDANSETRON HCL 4 MG/2ML IJ SOLN: 8.0000 mg | Freq: Once | INTRAMUSCULAR | Status: AC

## 2021-04-30 MED ORDER — HEPARIN SOD (PORK) LOCK FLUSH 100 UNIT/ML IV SOLN: 500.0000 [IU] | Freq: Once | INTRAVENOUS | Status: DC

## 2021-04-30 MED ORDER — SODIUM CHLORIDE 0.9 % IV SOLN: INTRAVENOUS | Status: DC

## 2021-04-30 NOTE — Progress Notes (Signed)
? ?Patient Care Team: ?Catherine Stains, MD as PCP - General (Family Medicine) ?Catherine Kaufmann, RN as Oncology Nurse Navigator ?Catherine Germany, RN as Oncology Nurse Navigator ? ?[name]:  ?Encounter [name]  ?Name Primary?  ? 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 ? 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 Screening mammogram detected right breast mass 1.2 cm, axilla normal, biopsy revealed grade 2 IDC with DCIS ER 90%, PR 30%, HER2 2+ by IHC FISH negative ?  ?02/08/2021 Cancer Staging  ? Staging form: Breast, AJCC 8th Edition ?- Clinical stage from 02/08/2021: Stage IA (cT1c, cN0, cM0, G2, ER+, PR+, HER2: Equivocal) - Signed by Catherine Lose, MD on 02/08/2021 ?Stage prefix: Initial [name] ?Histologic grading system: 3 grade system ? ?  ?03/02/2021 Surgery  ? Right lumpectomy: Grade 2 IDC 1.2 cm with intermediate grade DCIS, focal involvement of anterior medial margin junction, 1/4 lymph nodes positive ?  ?03/19/2021 Oncotype testing  ? Oncotype DX score: 28.  Distant recurrence at 10 years: 17% ?  ?03/24/2021 Cancer Staging  ? Staging form: Breast, AJCC 8th Edition ?- Pathologic: Stage IA (pT1c, pN1(sn), cM0, G2, ER+, PR+, HER2-, Oncotype DX score: 28) - Signed by Catherine Lose, MD on 03/24/2021 ?Method of lymph node assessment: Sentinel lymph node biopsy ?Multigene prognostic tests performed: Oncotype DX ?Recurrence score range: Greater than or equal to 11 ?Histologic grading system: 3 grade system ? ?  ?04/23/2021 -  Chemotherapy  ? Patient is on Treatment Plan : BREAST TC q21d  ? ?   ? ? ?CHIEF COMPLIANT:  Taxotere and Cytoxan cycle 2 ?  ? ?INTERVAL HISTORY: Catherine [name] is a 66 y.o. 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 She presents to the  clinic today for TC check and a follow-up. She state she has a bad headache today. She state she doesn't really ave taste for food.  IV fluids have helped her significantly   ? ? ?ALLERGIES:  is allergic to penicillins. ? ?MEDICATIONS:  ?Current Outpatient Medications  ?Medication Sig Dispense Refill  ? acetaminophen (TYLENOL) 500 MG tablet Take 1,000 mg by mouth every 6 (six) hours as needed for moderate pain.    ? Calcium Magnesium Zinc 333-133-5 MG TABS Take 1 tablet by mouth daily. (Patient taking differently: Take 1 tablet by mouth once a week.)    ? dexamethasone (DECADRON) 4 MG tablet Take 1 tablet (4 mg total) by mouth daily. Take 1 tablet day before chemo and 1 tablet day after chemo with food 8 tablet 0  ? diphenhydrAMINE (BENADRYL) 25 MG tablet Take 25 mg by mouth every 6 (six) hours as needed for allergies.    ? ergocalciferol (VITAMIN D2) 1.25 MG (50000 UT) capsule Take 1 capsule (50,000 Units total) by mouth once a week.    ? escitalopram (LEXAPRO) 5 MG tablet Take 5 mg by mouth once a week.    ? levothyroxine (SYNTHROID) 50 MCG tablet Take 1 tablet (50 mcg total) by mouth daily before breakfast.    ? lidocaine-prilocaine (EMLA) cream Apply to affected area once 30 g 3  ? metoprolol tartrate (LOPRESSOR) 25 MG tablet Take 12.5 mg by mouth 2 (two) times daily.    ? ondansetron (ZOFRAN) 8 MG tablet Take 1 tablet (8 mg total) by  mouth 2 (two) times daily as needed for refractory nausea / vomiting. Start on day 3 after chemo. 30 tablet 1  ? prochlorperazine (COMPAZINE) 10 MG tablet TAKE 1 TABLET(10 MG) BY MOUTH EVERY 6 HOURS AS NEEDED FOR NAUSEA OR VOMITING 30 tablet 1  ? rosuvastatin (CRESTOR) 5 MG tablet Take 1 tablet (5 mg total) by mouth daily.    ? ?Current Facility-Administered Medications  ?Medication Dose Route Frequency Provider Last Rate Last Admin  ? ondansetron (ZOFRAN) injection 8 mg  8 mg Intravenous Once Catherine Lose, MD      ? ? ?PHYSICAL EXAMINATION: ?ECOG PERFORMANCE STATUS: 1 -  Symptomatic but completely ambulatory ? ?Vitals:  ? 05/14/21 1023  ?BP: 135/77  ?Pulse: 82  ?Resp: 16  ?Temp: 97.7 ?F (36.5 ?C)  ?SpO2: 100%  ? ?Filed Weights  ? 05/14/21 1023  ?Weight: 181 lb 4.8 oz (82.2 kg)  ? ?  ? ?LABORATORY DATA:  ?I have reviewed the data as listed ? ?  Latest Ref Rng & Units 05/04/2021  ?  9:44 AM 04/30/2021  ? 10:23 AM 04/23/2021  ?  9:03 AM  ?CMP  ?Glucose 70 - 99 mg/dL 106   133   110    ?BUN 8 - 23 mg/dL '5   12   18    ' ?Creatinine 0.44 - 1.00 mg/dL 1.12   1.26   0.89    ?Sodium 135 - 145 mmol/L 140   136   138    ?Potassium 3.5 - 5.1 mmol/L 3.1   3.5   4.0    ?Chloride 98 - 111 mmol/L 106   101   106    ?CO2 22 - 32 mmol/L '27   27   25    ' ?Calcium 8.9 - 10.3 mg/dL 8.7   9.3   9.4    ?Total Protein 6.5 - 8.1 g/dL 6.6   7.0   7.5    ?Total Bilirubin 0.3 - 1.2 mg/dL 0.3   0.5   0.4    ?Alkaline Phos 38 - 126 U/L 104   85   95    ?AST 15 - 41 U/L '19   20   15    ' ?ALT 0 - 44 U/L '17   21   12    ' ? ? ?Lab Results  ?Component Value Date  ? WBC 5.9 05/14/2021  ? HGB 11.1 (L) 05/14/2021  ? HCT 33.4 (L) 05/14/2021  ? MCV 83.9 05/14/2021  ? PLT 349 05/14/2021  ? NEUTROABS 4.5 05/14/2021  ? ? 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  ?  ?Treatment plan: ?1.  Breast conserving surgery with sentinel lymph node biopsy  ?03/02/2021: Right lumpectomy: Grade 2 IDC 1.2 cm, intermediate grade DCIS, focal anterior and medial margin positive, 1/4 lymph nodes positive ?03/31/2021: Reexcision margins: Benign ?2. Adjuvant chemotherapy with Taxotere and Cytoxan every 3 weeks x4 cycles ?3.  Adjuvant radiation ?4.  Followed by adjuvant antiestrogen  therapy ?--------------------------------------------------------------------------------------------------------------------------------  ?Current treatment: Cycle 2 Taxotere Cytoxan ?  ?Chemotoxicities: ?Severe nausea and vomiting: Given IV fluids and supportive care.  Dose reduced for cycle 2. ?Diarrhea: Controlled  ?Severe fatigue: Slightly better ?Decreased appetite and lack of taste ?5.  Severe headache: Patient tells me that  Toradol works very well for her.  We will try to administer that today. ? ?Return to clinic in 1 week for normal saline and in 3 weeks for cycle 3 ? ? ? ?No orders of the defined types were placed in this encounter. ? ?The patient has a good understanding of the overall plan. she agrees with it. she will call with any problems that may develop before the next visit here. ?Total time spent: 30 mins including face to face time and time spent for planning, charting and co-ordination of care ? ? Harriette Ohara, MD ?05/14/21 ? ? ?I Gardiner Coins am scribing for Dr. Lindi Adie ? ?I have reviewed the above documentation for accuracy and completeness, and I agree with the above. ?  ?

## 2021-04-30 NOTE — Assessment & Plan Note (Signed)
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  ?? ?Treatment plan: ?1.??Breast conserving surgery with sentinel lymph node biopsy  ?03/02/2021: Right lumpectomy: Grade 2 IDC 1.2 cm, intermediate grade DCIS, focal anterior and medial margin positive, 1/4 lymph nodes positive ?03/31/2021: Reexcision margins: Benign ?2.?Adjuvant chemotherapy with Taxotere and Cytoxan every 3 weeks x4 cycles ?3.??Adjuvant radiation ?4.??Followed by adjuvant antiestrogen therapy ?--------------------------------------------------------------------------------------------------------------------------------? ?Current treatment: Cycle 1 day 8 Taxotere Cytoxan ? ?Chemotoxicities: ?? ?Return to clinic in 2 weeks for cycle 2 ?

## 2021-04-30 NOTE — Progress Notes (Signed)
Verbal orders received by MD for pt to receive 1 L NS over 2 hrs today along with 8 mg IV Zofran.  Orders placed under sign and held for infusion RN to release.  ?

## 2021-04-30 NOTE — Patient Instructions (Signed)

## 2021-04-30 NOTE — Progress Notes (Signed)
Infusion RN alerted this nurse that pt states she has experienced severe headache x several days.  Per MD symptoms are related to dehydration, usage of anti nausea medications, and constant dry heaving. Verbal orders received from MD for pt to receive 50 mg IV benadryl and 650 mg p.o tylenol today to help alleviate symptoms. Infusion RN notified to educate pt on home hydration and to repeat oral Benadryl 50 mg p.o and tylenol 650 mg p.o in 6 hours if symptoms still present.  RN states she will educate pt.   ?

## 2021-05-01 ENCOUNTER — Inpatient Hospital Stay: Payer: Medicare PPO

## 2021-05-01 VITALS — BP 109/68 | HR 99 | Temp 98.2°F | Resp 18 | Ht 63.0 in

## 2021-05-01 DIAGNOSIS — Z95828 Presence of other vascular implants and grafts: Secondary | ICD-10-CM

## 2021-05-01 DIAGNOSIS — R197 Diarrhea, unspecified: Secondary | ICD-10-CM | POA: Diagnosis not present

## 2021-05-01 DIAGNOSIS — Z170 Estrogen receptor positive status [ER+]: Secondary | ICD-10-CM

## 2021-05-01 DIAGNOSIS — C50511 Malignant neoplasm of lower-outer quadrant of right female breast: Secondary | ICD-10-CM | POA: Diagnosis not present

## 2021-05-01 DIAGNOSIS — Z5111 Encounter for antineoplastic chemotherapy: Secondary | ICD-10-CM | POA: Diagnosis not present

## 2021-05-01 DIAGNOSIS — R5383 Other fatigue: Secondary | ICD-10-CM | POA: Diagnosis not present

## 2021-05-01 DIAGNOSIS — R112 Nausea with vomiting, unspecified: Secondary | ICD-10-CM | POA: Diagnosis not present

## 2021-05-01 DIAGNOSIS — Z5189 Encounter for other specified aftercare: Secondary | ICD-10-CM | POA: Diagnosis not present

## 2021-05-01 DIAGNOSIS — Z79899 Other long term (current) drug therapy: Secondary | ICD-10-CM | POA: Diagnosis not present

## 2021-05-01 MED ORDER — HEPARIN SOD (PORK) LOCK FLUSH 100 UNIT/ML IV SOLN
500.0000 [IU] | Freq: Once | INTRAVENOUS | Status: AC
  Administered 2021-05-01: 500 [IU]

## 2021-05-01 MED ORDER — SODIUM CHLORIDE 0.9% FLUSH
10.0000 mL | Freq: Once | INTRAVENOUS | Status: AC
  Administered 2021-05-01: 10 mL

## 2021-05-01 MED ORDER — ONDANSETRON HCL 4 MG/2ML IJ SOLN
8.0000 mg | Freq: Once | INTRAMUSCULAR | Status: AC
  Administered 2021-05-01: 8 mg via INTRAVENOUS
  Filled 2021-05-01: qty 4

## 2021-05-01 MED ORDER — SODIUM CHLORIDE 0.9 % IV SOLN: Freq: Once | INTRAVENOUS | Status: AC

## 2021-05-01 MED ORDER — LOPERAMIDE HCL 2 MG PO CAPS
2.0000 mg | ORAL_CAPSULE | Freq: Once | ORAL | Status: AC
  Administered 2021-05-01: 2 mg via ORAL
  Filled 2021-05-01: qty 1

## 2021-05-01 MED ORDER — SODIUM CHLORIDE 0.9 % IV SOLN: 8.0000 mg | Freq: Once | INTRAVENOUS | Status: DC

## 2021-05-01 NOTE — Patient Instructions (Signed)
that are lost during dehydration. Dehydration is when there is not enough water or other fluids in the body. This happens when you lose more fluids than you take in. Common causes of dehydration include: ?Not drinking enough fluids. This can occur when you are ill or doing activities that require a lot of energy, especially in hot weather. ?Conditions that cause loss of water or other fluids, such as diarrhea, vomiting, sweating, or urinating a lot. ?Other illnesses, such as fever or infection. ?Certain medicines, such as those that remove excess fluid from the body (diuretics). ?Symptoms of mild or moderate dehydration may include thirst, dry lips and mouth, and dizziness. Symptoms of severe dehydration may include increased heart rate, confusion, fainting, and not urinating. ?For severe dehydration, you may need to get fluids through an IV at the hospital. For mild or moderate dehydration, you can usually rehydrate at home by drinking certain fluids as told by your health care provider. ?What are the risks? ?Generally, rehydration is safe. However, taking in too much fluid (overhydration) can be a problem. This is rare. Overhydration can cause an electrolyte imbalance, kidney failure, or a decrease in salt (sodium) levels in the body. ?Supplies needed ?You will need an oral rehydration solution (ORS) if your health care provider tells you to use one. This is a drink to treat dehydration. It can be found in pharmacies and retail stores. ?How to rehydrate ?Fluids ?Follow instructions from your health care provider for rehydration. The kind of fluid and the amount you should drink depend on your condition. In general, you should choose drinks that you prefer. ?If told by your health care provider, drink an ORS. ?Make an ORS by following instructions on the package. ?Start by drinking small amounts, about ? cup (120 mL) every 5-10 minutes. ?Slowly increase how much you drink until you have taken the amount recommended  by your health care provider. ?Drink enough clear fluids to keep your urine pale yellow. If you were told to drink an ORS, finish it first, then start slowly drinking other clear fluids. Drink fluids such as: ?Water. This includes sparkling water and flavored water. Drinking only water can lead to having too little sodium in your body (hyponatremia). Follow the advice of your health care provider. ?Water from ice chips you suck on. ?Fruit juice with water you add to it (diluted). ?Sports drinks. ?Hot or cold herbal teas. ?Broth-based soups. ?Milk or milk products. ?Food ?Follow instructions from your health care provider about what to eat while you rehydrate. Your health care provider may recommend that you slowly begin eating regular foods in small amounts. ?Eat foods that contain a healthy balance of electrolytes, such as bananas, oranges, potatoes, tomatoes, and spinach. ?Avoid foods that are greasy or contain a lot of sugar. ?In some cases, you may get nutrition through a feeding tube that is passed through your nose and into your stomach (nasogastric tube, or NG tube). This may be done if you have uncontrolled vomiting or diarrhea. ?Beverages to avoid ? ?Certain beverages may make dehydration worse. While you rehydrate, avoid drinking alcohol. ?How to tell if you are recovering from dehydration ?You may be recovering from dehydration if: ?You are urinating more often than before you started rehydrating. ?Your urine is pale yellow. ?Your energy level improves. ?You vomit less frequently. ?You have diarrhea less frequently. ?Your appetite improves or returns to normal. ?You feel less dizzy or less light-headed. ?Your skin tone and color start to look more normal. ?Follow these instructions  at home: ?Take over-the-counter and prescription medicines only as told by your health care provider. ?Do not take sodium tablets. Doing this can lead to having too much sodium in your body (hypernatremia). ?Contact a health  care provider if: ?You continue to have symptoms of mild or moderate dehydration, such as: ?Thirst. ?Dry lips. ?Slightly dry mouth. ?Dizziness. ?Dark urine or less urine than normal. ?Muscle cramps. ?You continue to vomit or have diarrhea. ?Get help right away if you: ?Have symptoms of dehydration that get worse. ?Have a fever. ?Have a severe headache. ?Have been vomiting and the following happens: ?Your vomiting gets worse or does not go away. ?Your vomit includes blood or green matter (bile). ?You cannot eat or drink without vomiting. ?Have problems with urination or bowel movements, such as: ?Diarrhea that gets worse or does not go away. ?Blood in your stool (feces). This may cause stool to look black and tarry. ?Not urinating, or urinating only a small amount of very dark urine, within 6-8 hours. ?Have trouble breathing. ?Have symptoms that get worse with treatment. ?These symptoms may represent a serious problem that is an emergency. Do not wait to see if the symptoms will go away. Get medical help right away. Call your local emergency services (911 in the [place] Do not drive yourself to the hospital. ?Summary ?Rehydration is the replacement of body fluids and minerals (electrolytes) that are lost during dehydration. ?Follow instructions from your health care provider for rehydration. The kind of fluid and amount you should drink depend on your condition. ?Slowly increase how much you drink until you have taken the amount recommended by your health care provider. ?Contact your health care provider if you continue to show signs of mild or moderate dehydration. ?This information is not intended to replace advice given to you by your health care provider. Make sure you discuss any questions you have with your health care provider. ? ? ?Document Revised: 02/20/2019 Document Reviewed: 12/31/2018 ?Elsevier Patient Education ? [place]. ? ?

## 2021-05-04 ENCOUNTER — Other Ambulatory Visit: Payer: Self-pay | Admitting: *Deleted

## 2021-05-04 ENCOUNTER — Inpatient Hospital Stay: Payer: Medicare PPO

## 2021-05-04 ENCOUNTER — Inpatient Hospital Stay: Payer: Medicare PPO | Attending: Hematology and Oncology | Admitting: Hematology and Oncology

## 2021-05-04 ENCOUNTER — Other Ambulatory Visit: Payer: Self-pay

## 2021-05-04 VITALS — BP 138/82 | HR 70 | Temp 98.0°F | Resp 18

## 2021-05-04 DIAGNOSIS — Z7952 Long term (current) use of systemic steroids: Secondary | ICD-10-CM | POA: Insufficient documentation

## 2021-05-04 DIAGNOSIS — C50511 Malignant neoplasm of lower-outer quadrant of right female breast: Secondary | ICD-10-CM | POA: Insufficient documentation

## 2021-05-04 DIAGNOSIS — R519 Headache, unspecified: Secondary | ICD-10-CM | POA: Insufficient documentation

## 2021-05-04 DIAGNOSIS — E860 Dehydration: Secondary | ICD-10-CM | POA: Diagnosis not present

## 2021-05-04 DIAGNOSIS — Z5189 Encounter for other specified aftercare: Secondary | ICD-10-CM | POA: Insufficient documentation

## 2021-05-04 DIAGNOSIS — R112 Nausea with vomiting, unspecified: Secondary | ICD-10-CM | POA: Insufficient documentation

## 2021-05-04 DIAGNOSIS — R5383 Other fatigue: Secondary | ICD-10-CM | POA: Insufficient documentation

## 2021-05-04 DIAGNOSIS — R109 Unspecified abdominal pain: Secondary | ICD-10-CM | POA: Diagnosis not present

## 2021-05-04 DIAGNOSIS — Z5111 Encounter for antineoplastic chemotherapy: Secondary | ICD-10-CM | POA: Insufficient documentation

## 2021-05-04 DIAGNOSIS — Z95828 Presence of other vascular implants and grafts: Secondary | ICD-10-CM

## 2021-05-04 DIAGNOSIS — Z170 Estrogen receptor positive status [ER+]: Secondary | ICD-10-CM

## 2021-05-04 DIAGNOSIS — Z79899 Other long term (current) drug therapy: Secondary | ICD-10-CM | POA: Insufficient documentation

## 2021-05-04 LAB — CMP (CANCER CENTER ONLY)
ALT: 17 U/L (ref 0–44)
AST: 19 U/L (ref 15–41)
Albumin: 3.7 g/dL (ref 3.5–5.0)
Alkaline Phosphatase: 104 U/L (ref 38–126)
Anion gap: 7 (ref 5–15)
BUN: 5 mg/dL — ABNORMAL LOW (ref 8–23)
CO2: 27 mmol/L (ref 22–32)
Calcium: 8.7 mg/dL — ABNORMAL LOW (ref 8.9–10.3)
Chloride: 106 mmol/L (ref 98–111)
Creatinine: 1.12 mg/dL — ABNORMAL HIGH (ref 0.44–1.00)
GFR, Estimated: 54 mL/min — ABNORMAL LOW (ref >60)
Glucose, Bld: 106 mg/dL — ABNORMAL HIGH (ref 70–99)
Potassium: 3.1 mmol/L — ABNORMAL LOW (ref 3.5–5.1)
Sodium: 140 mmol/L (ref 135–145)
Total Bilirubin: 0.3 mg/dL (ref 0.3–1.2)
Total Protein: 6.6 g/dL (ref 6.5–8.1)

## 2021-05-04 LAB — CBC WITH DIFFERENTIAL (CANCER CENTER ONLY)
Abs Immature Granulocytes: 1.49 10*3/uL — ABNORMAL HIGH (ref 0.00–0.07)
Basophils Absolute: 0.1 10*3/uL (ref 0.0–0.1)
Basophils Relative: 1 %
Eosinophils Absolute: 0 10*3/uL (ref 0.0–0.5)
Eosinophils Relative: 0 %
HCT: 35.5 % — ABNORMAL LOW (ref 36.0–46.0)
Hemoglobin: 11.6 g/dL — ABNORMAL LOW (ref 12.0–15.0)
Immature Granulocytes: 13 %
Lymphocytes Relative: 16 %
Lymphs Abs: 1.9 10*3/uL (ref 0.7–4.0)
MCH: 27.2 pg (ref 26.0–34.0)
MCHC: 32.7 g/dL (ref 30.0–36.0)
MCV: 83.1 fL (ref 80.0–100.0)
Monocytes Absolute: 1.1 10*3/uL — ABNORMAL HIGH (ref 0.1–1.0)
Monocytes Relative: 9 %
Neutro Abs: 7.1 10*3/uL (ref 1.7–7.7)
Neutrophils Relative %: 61 %
Platelet Count: 135 10*3/uL — ABNORMAL LOW (ref 150–400)
RBC: 4.27 MIL/uL (ref 3.87–5.11)
RDW: 13.5 % (ref 11.5–15.5)
WBC Count: 11.7 10*3/uL — ABNORMAL HIGH (ref 4.0–10.5)
nRBC: 0.6 % — ABNORMAL HIGH (ref 0.0–0.2)

## 2021-05-04 MED ORDER — SODIUM CHLORIDE 0.9% FLUSH
10.0000 mL | Freq: Once | INTRAVENOUS | Status: AC
  Administered 2021-05-04: 10 mL

## 2021-05-04 MED ORDER — SODIUM CHLORIDE 0.9 % IV SOLN: Freq: Once | INTRAVENOUS | Status: AC

## 2021-05-04 MED ORDER — SODIUM CHLORIDE 0.9 % IV SOLN: 8.0000 mg | Freq: Once | INTRAVENOUS | Status: DC

## 2021-05-04 MED ORDER — HEPARIN SOD (PORK) LOCK FLUSH 100 UNIT/ML IV SOLN
500.0000 [IU] | Freq: Once | INTRAVENOUS | Status: AC
  Administered 2021-05-04: 500 [IU]

## 2021-05-04 MED ORDER — ONDANSETRON HCL 4 MG/2ML IJ SOLN
8.0000 mg | Freq: Once | INTRAMUSCULAR | Status: AC
  Administered 2021-05-04: 8 mg via INTRAVENOUS
  Filled 2021-05-04: qty 4

## 2021-05-04 NOTE — Patient Instructions (Signed)
Rehydration, Adult Rehydration is the replacement of body fluids, salts, and minerals (electrolytes) that are lost during dehydration. Dehydration is when there is not enough water or other fluids in the body. This happens when you lose more fluids than you take in. Common causes of dehydration include: Not drinking enough fluids. This can occur when you are ill or doing activities that require a lot of energy, especially in hot weather. Conditions that cause loss of water or other fluids, such as diarrhea, vomiting, sweating, or urinating a lot. Other illnesses, such as fever or infection. Certain medicines, such as those that remove excess fluid from the body (diuretics). Symptoms of mild or moderate dehydration may include thirst, dry lips and mouth, and dizziness. Symptoms of severe dehydration may include increased heart rate, confusion, fainting, and not urinating. For severe dehydration, you may need to get fluids through an IV at the hospital. For mild or moderate dehydration, you can usually rehydrate at home by drinking certain fluids as told by your health care provider. What are the risks? Generally, rehydration is safe. However, taking in too much fluid (overhydration) can be a problem. This is rare. Overhydration can cause an electrolyte imbalance, kidney failure, or a decrease in salt (sodium) levels in the body. Supplies needed You will need an oral rehydration solution (ORS) if your health care provider tells you to use one. This is a drink to treat dehydration. It can be found in pharmacies and retail stores. How to rehydrate Fluids Follow instructions from your health care provider for rehydration. The kind of fluid and the amount you should drink depend on your condition. In general, you should choose drinks that you prefer. If told by your health care provider, drink an ORS. Make an ORS by following instructions on the package. Start by drinking small amounts, about  cup (120  mL) every 5-10 minutes. Slowly increase how much you drink until you have taken the amount recommended by your health care provider. Drink enough clear fluids to keep your urine pale yellow. If you were told to drink an ORS, finish it first, then start slowly drinking other clear fluids. Drink fluids such as: Water. This includes sparkling water and flavored water. Drinking only water can lead to having too little sodium in your body (hyponatremia). Follow the advice of your health care provider. Water from ice chips you suck on. Fruit juice with water you add to it (diluted). Sports drinks. Hot or cold herbal teas. Broth-based soups. Milk or milk products. Food Follow instructions from your health care provider about what to eat while you rehydrate. Your health care provider may recommend that you slowly begin eating regular foods in small amounts. Eat foods that contain a healthy balance of electrolytes, such as bananas, oranges, potatoes, tomatoes, and spinach. Avoid foods that are greasy or contain a lot of sugar. In some cases, you may get nutrition through a feeding tube that is passed through your nose and into your stomach (nasogastric tube, or NG tube). This may be done if you have uncontrolled vomiting or diarrhea. Beverages to avoid  Certain beverages may make dehydration worse. While you rehydrate, avoid drinking alcohol. How to tell if you are recovering from dehydration You may be recovering from dehydration if: You are urinating more often than before you started rehydrating. Your urine is pale yellow. Your energy level improves. You vomit less frequently. You have diarrhea less frequently. Your appetite improves or returns to normal. You feel less dizzy or less light-headed.   Your skin tone and color start to look more normal. Follow these instructions at home: Take over-the-counter and prescription medicines only as told by your health care provider. Do not take sodium  tablets. Doing this can lead to having too much sodium in your body (hypernatremia). Contact a health care provider if: You continue to have symptoms of mild or moderate dehydration, such as: Thirst. Dry lips. Slightly dry mouth. Dizziness. Dark urine or less urine than normal. Muscle cramps. You continue to vomit or have diarrhea. Get help right away if you: Have symptoms of dehydration that get worse. Have a fever. Have a severe headache. Have been vomiting and the following happens: Your vomiting gets worse or does not go away. Your vomit includes blood or green matter (bile). You cannot eat or drink without vomiting. Have problems with urination or bowel movements, such as: Diarrhea that gets worse or does not go away. Blood in your stool (feces). This may cause stool to look black and tarry. Not urinating, or urinating only a small amount of very dark urine, within 6-8 hours. Have trouble breathing. Have symptoms that get worse with treatment. These symptoms may represent a serious problem that is an emergency. Do not wait to see if the symptoms will go away. Get medical help right away. Call your local emergency services (911 in the [place] Do not drive yourself to the hospital. Summary Rehydration is the replacement of body fluids and minerals (electrolytes) that are lost during dehydration. Follow instructions from your health care provider for rehydration. The kind of fluid and amount you should drink depend on your condition. Slowly increase how much you drink until you have taken the amount recommended by your health care provider. Contact your health care provider if you continue to show signs of mild or moderate dehydration. This information is not intended to replace advice given to you by your health care provider. Make sure you discuss any questions you have with your health care provider. Document Revised: 02/20/2019 Document Reviewed: 12/31/2018 Elsevier Patient  Education  2023 [employer]  

## 2021-05-04 NOTE — Assessment & Plan Note (Signed)
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 ?? ?Treatment plan: ?1.??Breast conserving surgery with sentinel lymph node biopsy? ?03/02/2021: Right lumpectomy: Grade 2 IDC 1.2 cm, intermediate grade DCIS, focal anterior and medial margin positive, 1/4 lymph nodes positive ?03/31/2021: Reexcision margins: Benign ?2.?Adjuvant chemotherapy with Taxotere and Cytoxan every 3 weeks x4 cycles ?3.??Adjuvant radiation ?4.??Followed by adjuvant antiestrogen therapy ?--------------------------------------------------------------------------------------------------------------------------------? ?Current treatment:?Cycle 1 day 12 Taxotere Cytoxan ?? ?Chemotoxicities: ?1. Severe nausea and vomiting: We will give her IV fluids today. ?2. Diarrhea: Controlled  ?3. Severe fatigue: Slightly better ?4. Decreased appetite and lack of taste ?? ?We will reduce the dosage of cycle 2 of chemotherapy. ?? ?Return to clinic with cycle 2 of chemo ?

## 2021-05-04 NOTE — Progress Notes (Signed)
? ?Patient Care Team: ?Catherine Stains, MD as PCP - General (Family Medicine) ?Mauro Kaufmann, RN as Oncology Nurse Navigator ?Rockwell Germany, RN as Oncology Nurse Navigator ? ?DIAGNOSIS:  ?Encounter Diagnosis  ?Name Primary?  ? Malignant neoplasm of lower-outer quadrant of right breast of female, estrogen receptor positive ([place])   ? ? ?SUMMARY OF ONCOLOGIC HISTORY: ?Oncology History  ?Malignant neoplasm of lower-outer quadrant of right breast of female, estrogen receptor positive ([place])  ?01/29/2021 Initial Diagnosis  ? Screening mammogram detected right breast mass 1.2 cm, axilla normal, biopsy revealed grade 2 IDC with DCIS ER 90%, PR 30%, HER2 2+ by IHC FISH negative ?  ?02/08/2021 Cancer Staging  ? Staging form: Breast, AJCC 8th Edition ?- Clinical stage from 02/08/2021: Stage IA (cT1c, cN0, cM0, G2, ER+, PR+, HER2: Equivocal) - Signed by Nicholas Lose, MD on 02/08/2021 ?Stage prefix: Initial diagnosis ?Histologic grading system: 3 grade system ? ?  ?03/02/2021 Surgery  ? Right lumpectomy: Grade 2 IDC 1.2 cm with intermediate grade DCIS, focal involvement of anterior medial margin junction, 1/4 lymph nodes positive ?  ?03/19/2021 Oncotype testing  ? Oncotype DX score: 28.  Distant recurrence at 10 years: 17% ?  ?03/24/2021 Cancer Staging  ? Staging form: Breast, AJCC 8th Edition ?- Pathologic: Stage IA (pT1c, pN1(sn), cM0, G2, ER+, PR+, HER2-, Oncotype DX score: 28) - Signed by Nicholas Lose, MD on 03/24/2021 ?Method of lymph node assessment: Sentinel lymph node biopsy ?Multigene prognostic tests performed: Oncotype DX ?Recurrence score range: Greater than or equal to 11 ?Histologic grading system: 3 grade system ? ?  ?04/23/2021 -  Chemotherapy  ? Patient is on Treatment Plan : BREAST TC q21d  ? ?  ?  ? ? ?CHIEF COMPLIANT: Follow-up dehydration and Nausea. ? ?INTERVAL HISTORY: Catherine [name] is a 66 y.o. 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 She presents to  the clinic today for TC check and a follow-up. She state she did better this week. She states that she still have not built her appetite back yet. She states that she is drinking ginger ale eating yogurt peas and rice. She states that energy level was good after she received fluids. ? ? ?ALLERGIES:  is allergic to penicillins. ? ?MEDICATIONS:  ?Current Outpatient Medications  ?Medication Sig Dispense Refill  ? acetaminophen (TYLENOL) 500 MG tablet Take 1,000 mg by mouth every 6 (six) hours as needed for moderate pain.    ? Calcium Magnesium Zinc 333-133-5 MG TABS Take 1 tablet by mouth daily. (Patient taking differently: Take 1 tablet by mouth once a week.)    ? dexamethasone (DECADRON) 4 MG tablet Take 1 tablet (4 mg total) by mouth daily. Take 1 tablet day before chemo and 1 tablet day after chemo with food 8 tablet 0  ? diphenhydrAMINE (BENADRYL) 25 MG tablet Take 25 mg by mouth every 6 (six) hours as needed for allergies.    ? ergocalciferol (VITAMIN D2) 1.25 MG (50000 UT) capsule Take 1 capsule (50,000 Units total) by mouth once a week.    ? escitalopram (LEXAPRO) 5 MG tablet Take 5 mg by mouth once a week.    ? levothyroxine (SYNTHROID) 50 MCG tablet Take 1 tablet (50 mcg total) by mouth daily before breakfast.    ? lidocaine-prilocaine (EMLA) cream Apply to affected area once 30 g 3  ? metoprolol tartrate (LOPRESSOR) 25 MG tablet Take 12.5 mg by mouth 2 (two) times daily.    ?  ondansetron (ZOFRAN) 8 MG tablet Take 1 tablet (8 mg total) by mouth 2 (two) times daily as needed for refractory nausea / vomiting. Start on day 3 after chemo. 30 tablet 1  ? prochlorperazine (COMPAZINE) 10 MG tablet TAKE 1 TABLET(10 MG) BY MOUTH EVERY 6 HOURS AS NEEDED FOR NAUSEA OR VOMITING 30 tablet 1  ? rosuvastatin (CRESTOR) 5 MG tablet Take 1 tablet (5 mg total) by mouth daily.    ? ?Current Facility-Administered Medications  ?Medication Dose Route Frequency Provider Last Rate Last Admin  ? ondansetron (ZOFRAN) injection 8 mg  8  mg Intravenous Once Nicholas Lose, MD      ? ? ?PHYSICAL EXAMINATION: ?ECOG PERFORMANCE STATUS: 1 - Symptomatic but completely ambulatory ? ?Vitals:  ? 05/04/21 0952  ?BP: (!) 149/83  ?Pulse: 87  ?Resp: 18  ?Temp: 97.7 ?F (36.5 ?C)  ?SpO2: 98%  ? ?Filed Weights  ? 05/04/21 0952  ?Weight: 181 lb 14.4 oz (82.5 kg)  ? ?  ? ?LABORATORY DATA:  ?I have reviewed the data as listed ? ?  Latest Ref Rng & Units 05/04/2021  ?  9:44 AM 04/30/2021  ? 10:23 AM 04/23/2021  ?  9:03 AM  ?CMP  ?Glucose 70 - 99 mg/dL 106   133   110    ?BUN 8 - 23 mg/dL '5   12   18    ' ?Creatinine 0.44 - 1.00 mg/dL 1.12   1.26   0.89    ?Sodium 135 - 145 mmol/L 140   136   138    ?Potassium 3.5 - 5.1 mmol/L 3.1   3.5   4.0    ?Chloride 98 - 111 mmol/L 106   101   106    ?CO2 22 - 32 mmol/L '27   27   25    ' ?Calcium 8.9 - 10.3 mg/dL 8.7   9.3   9.4    ?Total Protein 6.5 - 8.1 g/dL 6.6   7.0   7.5    ?Total Bilirubin 0.3 - 1.2 mg/dL 0.3   0.5   0.4    ?Alkaline Phos 38 - 126 U/L 104   85   95    ?AST 15 - 41 U/L '19   20   15    ' ?ALT 0 - 44 U/L '17   21   12    ' ? ? ?Lab Results  ?Component Value Date  ? WBC 11.7 (H) 05/04/2021  ? HGB 11.6 (L) 05/04/2021  ? HCT 35.5 (L) 05/04/2021  ? MCV 83.1 05/04/2021  ? PLT 135 (L) 05/04/2021  ? NEUTROABS PENDING 05/04/2021  ? ? 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  ?  ?Treatment plan: ?1.  Breast conserving surgery with sentinel lymph node biopsy  ?03/02/2021: Right lumpectomy: Grade 2 IDC 1.2 cm, intermediate grade DCIS, focal anterior and medial margin positive, 1/4 lymph nodes positive ?03/31/2021: Reexcision margins: Benign ?2. Adjuvant chemotherapy with Taxotere and Cytoxan every 3 weeks x4 cycles ?3.  Adjuvant radiation ?4.  Followed by adjuvant  antiestrogen therapy ?--------------------------------------------------------------------------------------------------------------------------------  ?Current treatment: Cycle 1 day 12 Taxotere Cytoxan ?  ?Chemotoxicities: ?Severe nausea and vomiting: We will give her IV fluids today. ?Diarrhea: Controlled  ?Severe fatigue: Slightly better ?Decreased appetite  and lack of taste ?  ?We will reduce the dosage of cycle 2 of chemotherapy. ?We plan to give her 1 L of normal saline today. ?Return to clinic with cycle 2 of chemo ? ? ? ?No orders of the defined types were placed in this encounter. ? ?The patient has a good understanding of the overall plan. she agrees with it. she will call with any problems that may develop before the next visit here. ?Total time spent: 30 mins including face to face time and time spent for planning, charting and co-ordination of care ? ? Harriette Ohara, MD ?05/04/21 ? ? ? I Gardiner Coins am scribing for Dr. Lindi Adie ? ?I have reviewed the above documentation for accuracy and completeness, and I agree with the above. ?  ?

## 2021-05-07 ENCOUNTER — Inpatient Hospital Stay: Payer: Medicare PPO | Admitting: Hematology and Oncology

## 2021-05-07 ENCOUNTER — Inpatient Hospital Stay: Payer: Medicare PPO

## 2021-05-10 ENCOUNTER — Inpatient Hospital Stay: Payer: Medicare PPO

## 2021-05-14 ENCOUNTER — Inpatient Hospital Stay: Payer: Medicare PPO

## 2021-05-14 ENCOUNTER — Encounter: Payer: Self-pay | Admitting: *Deleted

## 2021-05-14 ENCOUNTER — Other Ambulatory Visit: Payer: Self-pay

## 2021-05-14 ENCOUNTER — Inpatient Hospital Stay: Payer: Medicare PPO | Admitting: Hematology and Oncology

## 2021-05-14 ENCOUNTER — Inpatient Hospital Stay: Payer: Medicare PPO | Admitting: Dietician

## 2021-05-14 DIAGNOSIS — Z5111 Encounter for antineoplastic chemotherapy: Secondary | ICD-10-CM | POA: Diagnosis not present

## 2021-05-14 DIAGNOSIS — R519 Headache, unspecified: Secondary | ICD-10-CM | POA: Diagnosis not present

## 2021-05-14 DIAGNOSIS — C50511 Malignant neoplasm of lower-outer quadrant of right female breast: Secondary | ICD-10-CM

## 2021-05-14 DIAGNOSIS — Z170 Estrogen receptor positive status [ER+]: Secondary | ICD-10-CM | POA: Diagnosis not present

## 2021-05-14 DIAGNOSIS — Z79899 Other long term (current) drug therapy: Secondary | ICD-10-CM | POA: Diagnosis not present

## 2021-05-14 DIAGNOSIS — R5383 Other fatigue: Secondary | ICD-10-CM | POA: Diagnosis not present

## 2021-05-14 DIAGNOSIS — Z5189 Encounter for other specified aftercare: Secondary | ICD-10-CM | POA: Diagnosis not present

## 2021-05-14 DIAGNOSIS — R109 Unspecified abdominal pain: Secondary | ICD-10-CM | POA: Diagnosis not present

## 2021-05-14 DIAGNOSIS — E860 Dehydration: Secondary | ICD-10-CM | POA: Diagnosis not present

## 2021-05-14 DIAGNOSIS — Z95828 Presence of other vascular implants and grafts: Secondary | ICD-10-CM

## 2021-05-14 LAB — CBC WITH DIFFERENTIAL (CANCER CENTER ONLY)
Abs Immature Granulocytes: 0.01 10*3/uL (ref 0.00–0.07)
Basophils Absolute: 0 10*3/uL (ref 0.0–0.1)
Basophils Relative: 0 %
Eosinophils Absolute: 0 10*3/uL (ref 0.0–0.5)
Eosinophils Relative: 0 %
HCT: 33.4 % — ABNORMAL LOW (ref 36.0–46.0)
Hemoglobin: 11.1 g/dL — ABNORMAL LOW (ref 12.0–15.0)
Immature Granulocytes: 0 %
Lymphocytes Relative: 15 %
Lymphs Abs: 0.9 10*3/uL (ref 0.7–4.0)
MCH: 27.9 pg (ref 26.0–34.0)
MCHC: 33.2 g/dL (ref 30.0–36.0)
MCV: 83.9 fL (ref 80.0–100.0)
Monocytes Absolute: 0.5 10*3/uL (ref 0.1–1.0)
Monocytes Relative: 8 %
Neutro Abs: 4.5 10*3/uL (ref 1.7–7.7)
Neutrophils Relative %: 77 %
Platelet Count: 349 10*3/uL (ref 150–400)
RBC: 3.98 MIL/uL (ref 3.87–5.11)
RDW: 14.6 % (ref 11.5–15.5)
WBC Count: 5.9 10*3/uL (ref 4.0–10.5)
nRBC: 0 % (ref 0.0–0.2)

## 2021-05-14 LAB — CMP (CANCER CENTER ONLY)
ALT: 10 U/L (ref 0–44)
AST: 11 U/L — ABNORMAL LOW (ref 15–41)
Albumin: 3.9 g/dL (ref 3.5–5.0)
Alkaline Phosphatase: 67 U/L (ref 38–126)
Anion gap: 8 (ref 5–15)
BUN: 11 mg/dL (ref 8–23)
CO2: 26 mmol/L (ref 22–32)
Calcium: 9.4 mg/dL (ref 8.9–10.3)
Chloride: 105 mmol/L (ref 98–111)
Creatinine: 0.9 mg/dL (ref 0.44–1.00)
GFR, Estimated: >60 mL/min (ref >60)
Glucose, Bld: 114 mg/dL — ABNORMAL HIGH (ref 70–99)
Potassium: 3.7 mmol/L (ref 3.5–5.1)
Sodium: 139 mmol/L (ref 135–145)
Total Bilirubin: 0.4 mg/dL (ref 0.3–1.2)
Total Protein: 6.9 g/dL (ref 6.5–8.1)

## 2021-05-14 MED ORDER — KETOROLAC TROMETHAMINE 30 MG/ML IJ SOLN
30.0000 mg | Freq: Once | INTRAMUSCULAR | Status: AC
  Administered 2021-05-14: 30 mg via INTRAVENOUS
  Filled 2021-05-14: qty 1

## 2021-05-14 MED ORDER — SODIUM CHLORIDE 0.9 % IV SOLN
10.0000 mg | Freq: Once | INTRAVENOUS | Status: AC
  Administered 2021-05-14: 10 mg via INTRAVENOUS
  Filled 2021-05-14: qty 10

## 2021-05-14 MED ORDER — SODIUM CHLORIDE 0.9 % IV SOLN: Freq: Once | INTRAVENOUS | Status: AC

## 2021-05-14 MED ORDER — SODIUM CHLORIDE 0.9 % IV SOLN
150.0000 mg | Freq: Once | INTRAVENOUS | Status: AC
  Administered 2021-05-14: 150 mg via INTRAVENOUS
  Filled 2021-05-14: qty 150

## 2021-05-14 MED ORDER — SODIUM CHLORIDE 0.9 % IV SOLN
400.0000 mg/m2 | Freq: Once | INTRAVENOUS | Status: AC
  Administered 2021-05-14: 780 mg via INTRAVENOUS
  Filled 2021-05-14: qty 39

## 2021-05-14 MED ORDER — SODIUM CHLORIDE 0.9 % IV SOLN: Freq: Once | INTRAVENOUS | Status: DC

## 2021-05-14 MED ORDER — SODIUM CHLORIDE 0.9 % IV SOLN
65.0000 mg/m2 | Freq: Once | INTRAVENOUS | Status: AC
  Administered 2021-05-14: 130 mg via INTRAVENOUS
  Filled 2021-05-14: qty 13

## 2021-05-14 MED ORDER — SODIUM CHLORIDE 0.9% FLUSH
10.0000 mL | Freq: Once | INTRAVENOUS | Status: AC
  Administered 2021-05-14: 10 mL

## 2021-05-14 MED ORDER — PALONOSETRON HCL INJECTION 0.25 MG/5ML
0.2500 mg | Freq: Once | INTRAVENOUS | Status: AC
  Administered 2021-05-14: 0.25 mg via INTRAVENOUS
  Filled 2021-05-14: qty 5

## 2021-05-14 NOTE — Assessment & Plan Note (Signed)
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 ?? ?Treatment plan: ?1.??Breast conserving surgery with sentinel lymph node biopsy? ?03/02/2021: Right lumpectomy: Grade 2 IDC 1.2 cm, intermediate grade DCIS, focal anterior and medial margin positive, 1/4 lymph nodes positive ?03/31/2021: Reexcision margins: Benign ?2.?Adjuvant chemotherapy with Taxotere and Cytoxan every 3 weeks x4 cycles ?3.??Adjuvant radiation ?4.??Followed by adjuvant antiestrogen therapy ?--------------------------------------------------------------------------------------------------------------------------------? ?Current treatment:?Cycle 2?Taxotere Cytoxan ?? ?Chemotoxicities: ?1. Severe nausea and vomiting: Given IV fluids and supportive care.  Dose reduced for cycle 2. ?2. Diarrhea: Controlled  ?3. Severe fatigue: Slightly better ?4. Decreased appetite and lack of taste ?? ?Return to clinic in 1 week for normal saline and in 3 weeks for cycle 3 ? ?

## 2021-05-14 NOTE — Progress Notes (Signed)
Nutrition Assessment ? ? ?Reason for Assessment: Provider referral  ? ? ?ASSESSMENT: 66 year old female with DCIS of right breast. S/p breast conserving surgery 2/28. She is currently receiving adjuvant chemotherapy with cytoxan/taxotere. Dose reduced starting cycle 2 due to chemotoxicities. Patient is under the care of Dr. Lindi Adie.  ? ?Past medical history includes anxiety, HTN, HLD, hypothyroidism, kidney stones ? ?Met with patient in infusion. She reports poor appetite and no taste for food. Patient is tired of wasting money on food she thinks she wants. By the time it arrives it is no longer appealing. Patient reports bitter/metallic taste. She is using plastic silverware as well as paper plates. This has helped some. Patient reports persistent nausea. She is receiving IV nausea medication today. Patient reports diarrhea lasting ~approximately 2 weeks after first cycle, states throwing away so many pair of pajamas. Patient received IV fluids 4/28 as well as 5/2. This was helpful. She continues to feel fatigued. She reports she is sleepy, closing her eyes throughout visit.  ? ? ?Nutrition Focused Physical Exam:  ? ?Orbital Region: mild ?Buccal Region: mild ?Upper Arm Region: UTA ?Thoracic and Lumbar Region: UTA ?Temple Region: mild ?Clavicle Bone Region: UTA ?Shoulder and Acromion Bone Region: none ?Scapular Bone Region: UTA ?Dorsal Hand: none ?Patellar Region: UTA ?Anterior Thigh Region: UTA ?Posterior Calf Region: UTA ?Edema (RD assessment): UTA ?Hair: reviewed ?Eyes: reviewed ?Mouth: reviewed ?Skin: UTA ?Nails: reviewed ? ? ?Medications: decadron, D2, lexapro, synthroid, lopressor, zofran, compazine, crestor ? ? ?Labs: glucose 114 ? ? ?Anthropometrics:  ? ?Height: 5'3" ?Weight: 181 lb 4.8 oz  ?UBW: 180-185 lb (per pt) ?BMI: 32.12 ? ? ?NUTRITION DIAGNOSIS: Inadequate oral intake related to cancer and associated treatment side effects as evidenced by reported altered taste, nausea, vomiting, diarrhea   ? ? ?INTERVENTION:  ?Educated on small frequent meals and snacks throughout the day with adequate calories and protein - snack ideas given ?Discussed strategies for nausea and diarrhea - foods best tolerated, foods to avoid, importance of hydration - handouts with tips provided as well as sample packets of Banatrol ?Discussed strategies for altered taste - handout provided ?Recommend baking soda salt water rinses throughout the day and before meals - recipe given ?Suggested drinking oral nutrition supplement, 1-2/day - samples of Ensure Plus, Ensure Clear, and Boost HP given for pt to try - coupons provided ?Contact information given  ? ? ? ? ?MONITORING, EVALUATION, GOAL: Patient will tolerate adequate calories and protein to minimize weight loss  ? ? ?Next Visit: Friday June 2 during infusion  ? ? ? ? ? ?

## 2021-05-15 ENCOUNTER — Inpatient Hospital Stay: Payer: Medicare PPO

## 2021-05-15 VITALS — BP 150/66 | HR 62 | Temp 97.7°F | Resp 17

## 2021-05-15 DIAGNOSIS — R519 Headache, unspecified: Secondary | ICD-10-CM | POA: Diagnosis not present

## 2021-05-15 DIAGNOSIS — Z170 Estrogen receptor positive status [ER+]: Secondary | ICD-10-CM | POA: Diagnosis not present

## 2021-05-15 DIAGNOSIS — Z5111 Encounter for antineoplastic chemotherapy: Secondary | ICD-10-CM | POA: Diagnosis not present

## 2021-05-15 DIAGNOSIS — Z79899 Other long term (current) drug therapy: Secondary | ICD-10-CM | POA: Diagnosis not present

## 2021-05-15 DIAGNOSIS — R5383 Other fatigue: Secondary | ICD-10-CM | POA: Diagnosis not present

## 2021-05-15 DIAGNOSIS — E860 Dehydration: Secondary | ICD-10-CM | POA: Diagnosis not present

## 2021-05-15 DIAGNOSIS — R109 Unspecified abdominal pain: Secondary | ICD-10-CM | POA: Diagnosis not present

## 2021-05-15 DIAGNOSIS — C50511 Malignant neoplasm of lower-outer quadrant of right female breast: Secondary | ICD-10-CM | POA: Diagnosis not present

## 2021-05-15 DIAGNOSIS — Z5189 Encounter for other specified aftercare: Secondary | ICD-10-CM | POA: Diagnosis not present

## 2021-05-15 MED ORDER — SODIUM CHLORIDE 0.9% FLUSH
10.0000 mL | Freq: Once | INTRAVENOUS | Status: AC
  Administered 2021-05-15: 10 mL

## 2021-05-15 MED ORDER — HEPARIN SOD (PORK) LOCK FLUSH 100 UNIT/ML IV SOLN
500.0000 [IU] | Freq: Once | INTRAVENOUS | Status: AC
  Administered 2021-05-15: 500 [IU]

## 2021-05-15 MED ORDER — SODIUM CHLORIDE 0.9 % IV SOLN: Freq: Once | INTRAVENOUS | Status: AC

## 2021-05-15 MED ORDER — ONDANSETRON HCL 4 MG/2ML IJ SOLN
8.0000 mg | Freq: Once | INTRAMUSCULAR | Status: AC
  Administered 2021-05-15: 8 mg via INTRAVENOUS

## 2021-05-15 MED ORDER — SODIUM CHLORIDE 0.9 % IV SOLN: 8.0000 mg | Freq: Once | INTRAVENOUS | Status: DC

## 2021-05-15 MED ORDER — ONDANSETRON HCL 4 MG/2ML IJ SOLN
INTRAMUSCULAR | Status: AC
  Filled 2021-05-15: qty 2

## 2021-05-15 NOTE — Patient Instructions (Signed)
Rehydration, Adult Rehydration is the replacement of body fluids, salts, and minerals (electrolytes) that are lost during dehydration. Dehydration is when there is not enough water or other fluids in the body. This happens when you lose more fluids than you take in. Common causes of dehydration include: Not drinking enough fluids. This can occur when you are ill or doing activities that require a lot of energy, especially in hot weather. Conditions that cause loss of water or other fluids, such as diarrhea, vomiting, sweating, or urinating a lot. Other illnesses, such as fever or infection. Certain medicines, such as those that remove excess fluid from the body (diuretics). Symptoms of mild or moderate dehydration may include thirst, dry lips and mouth, and dizziness. Symptoms of severe dehydration may include increased heart rate, confusion, fainting, and not urinating. For severe dehydration, you may need to get fluids through an IV at the hospital. For mild or moderate dehydration, you can usually rehydrate at home by drinking certain fluids as told by your health care provider. What are the risks? Generally, rehydration is safe. However, taking in too much fluid (overhydration) can be a problem. This is rare. Overhydration can cause an electrolyte imbalance, kidney failure, or a decrease in salt (sodium) levels in the body. Supplies needed You will need an oral rehydration solution (ORS) if your health care provider tells you to use one. This is a drink to treat dehydration. It can be found in pharmacies and retail stores. How to rehydrate Fluids Follow instructions from your health care provider for rehydration. The kind of fluid and the amount you should drink depend on your condition. In general, you should choose drinks that you prefer. If told by your health care provider, drink an ORS. Make an ORS by following instructions on the package. Start by drinking small amounts, about  cup (120  mL) every 5-10 minutes. Slowly increase how much you drink until you have taken the amount recommended by your health care provider. Drink enough clear fluids to keep your urine pale yellow. If you were told to drink an ORS, finish it first, then start slowly drinking other clear fluids. Drink fluids such as: Water. This includes sparkling water and flavored water. Drinking only water can lead to having too little sodium in your body (hyponatremia). Follow the advice of your health care provider. Water from ice chips you suck on. Fruit juice with water you add to it (diluted). Sports drinks. Hot or cold herbal teas. Broth-based soups. Milk or milk products. Food Follow instructions from your health care provider about what to eat while you rehydrate. Your health care provider may recommend that you slowly begin eating regular foods in small amounts. Eat foods that contain a healthy balance of electrolytes, such as bananas, oranges, potatoes, tomatoes, and spinach. Avoid foods that are greasy or contain a lot of sugar. In some cases, you may get nutrition through a feeding tube that is passed through your nose and into your stomach (nasogastric tube, or NG tube). This may be done if you have uncontrolled vomiting or diarrhea. Beverages to avoid  Certain beverages may make dehydration worse. While you rehydrate, avoid drinking alcohol. How to tell if you are recovering from dehydration You may be recovering from dehydration if: You are urinating more often than before you started rehydrating. Your urine is pale yellow. Your energy level improves. You vomit less frequently. You have diarrhea less frequently. Your appetite improves or returns to normal. You feel less dizzy or less light-headed.   Your skin tone and color start to look more normal. Follow these instructions at home: Take over-the-counter and prescription medicines only as told by your health care provider. Do not take sodium  tablets. Doing this can lead to having too much sodium in your body (hypernatremia). Contact a health care provider if: You continue to have symptoms of mild or moderate dehydration, such as: Thirst. Dry lips. Slightly dry mouth. Dizziness. Dark urine or less urine than normal. Muscle cramps. You continue to vomit or have diarrhea. Get help right away if you: Have symptoms of dehydration that get worse. Have a fever. Have a severe headache. Have been vomiting and the following happens: Your vomiting gets worse or does not go away. Your vomit includes blood or green matter (bile). You cannot eat or drink without vomiting. Have problems with urination or bowel movements, such as: Diarrhea that gets worse or does not go away. Blood in your stool (feces). This may cause stool to look black and tarry. Not urinating, or urinating only a small amount of very dark urine, within 6-8 hours. Have trouble breathing. Have symptoms that get worse with treatment. These symptoms may represent a serious problem that is an emergency. Do not wait to see if the symptoms will go away. Get medical help right away. Call your local emergency services (911 in the [place] Do not drive yourself to the hospital. Summary Rehydration is the replacement of body fluids and minerals (electrolytes) that are lost during dehydration. Follow instructions from your health care provider for rehydration. The kind of fluid and amount you should drink depend on your condition. Slowly increase how much you drink until you have taken the amount recommended by your health care provider. Contact your health care provider if you continue to show signs of mild or moderate dehydration. This information is not intended to replace advice given to you by your health care provider. Make sure you discuss any questions you have with your health care provider. Document Revised: 02/20/2019 Document Reviewed: 12/31/2018 Elsevier Patient  Education  2023 [employer]  

## 2021-05-17 ENCOUNTER — Inpatient Hospital Stay: Payer: Medicare PPO

## 2021-05-17 ENCOUNTER — Telehealth: Payer: Self-pay | Admitting: Adult Health

## 2021-05-17 NOTE — Telephone Encounter (Signed)
R/s per 5/15 in basket, pt has been called and confirmed ?

## 2021-05-19 ENCOUNTER — Inpatient Hospital Stay (HOSPITAL_BASED_OUTPATIENT_CLINIC_OR_DEPARTMENT_OTHER): Payer: Medicare PPO | Admitting: Physician Assistant

## 2021-05-19 ENCOUNTER — Other Ambulatory Visit: Payer: Self-pay

## 2021-05-19 ENCOUNTER — Inpatient Hospital Stay: Payer: Medicare PPO

## 2021-05-19 ENCOUNTER — Other Ambulatory Visit: Payer: Self-pay | Admitting: *Deleted

## 2021-05-19 VITALS — BP 124/74 | HR 81 | Temp 98.3°F | Resp 17

## 2021-05-19 VITALS — BP 96/70 | HR 101 | Temp 98.3°F | Resp 18

## 2021-05-19 DIAGNOSIS — R109 Unspecified abdominal pain: Secondary | ICD-10-CM

## 2021-05-19 DIAGNOSIS — Z170 Estrogen receptor positive status [ER+]: Secondary | ICD-10-CM

## 2021-05-19 DIAGNOSIS — R519 Headache, unspecified: Secondary | ICD-10-CM | POA: Diagnosis not present

## 2021-05-19 DIAGNOSIS — Z79899 Other long term (current) drug therapy: Secondary | ICD-10-CM | POA: Diagnosis not present

## 2021-05-19 DIAGNOSIS — E860 Dehydration: Secondary | ICD-10-CM | POA: Diagnosis not present

## 2021-05-19 DIAGNOSIS — R112 Nausea with vomiting, unspecified: Secondary | ICD-10-CM

## 2021-05-19 DIAGNOSIS — Z95828 Presence of other vascular implants and grafts: Secondary | ICD-10-CM

## 2021-05-19 DIAGNOSIS — R5383 Other fatigue: Secondary | ICD-10-CM | POA: Diagnosis not present

## 2021-05-19 DIAGNOSIS — Z5111 Encounter for antineoplastic chemotherapy: Secondary | ICD-10-CM | POA: Diagnosis not present

## 2021-05-19 DIAGNOSIS — Z5189 Encounter for other specified aftercare: Secondary | ICD-10-CM | POA: Diagnosis not present

## 2021-05-19 DIAGNOSIS — C50511 Malignant neoplasm of lower-outer quadrant of right female breast: Secondary | ICD-10-CM | POA: Diagnosis not present

## 2021-05-19 LAB — CBC WITH DIFFERENTIAL (CANCER CENTER ONLY)
Abs Immature Granulocytes: 0.01 10*3/uL (ref 0.00–0.07)
Basophils Absolute: 0 10*3/uL (ref 0.0–0.1)
Basophils Relative: 0 %
Eosinophils Absolute: 0 10*3/uL (ref 0.0–0.5)
Eosinophils Relative: 0 %
HCT: 31.3 % — ABNORMAL LOW (ref 36.0–46.0)
Hemoglobin: 10.5 g/dL — ABNORMAL LOW (ref 12.0–15.0)
Immature Granulocytes: 0 %
Lymphocytes Relative: 21 %
Lymphs Abs: 0.5 10*3/uL — ABNORMAL LOW (ref 0.7–4.0)
MCH: 28.1 pg (ref 26.0–34.0)
MCHC: 33.5 g/dL (ref 30.0–36.0)
MCV: 83.7 fL (ref 80.0–100.0)
Monocytes Absolute: 0.1 10*3/uL (ref 0.1–1.0)
Monocytes Relative: 2 %
Neutro Abs: 1.7 10*3/uL (ref 1.7–7.7)
Neutrophils Relative %: 77 %
Platelet Count: 238 10*3/uL (ref 150–400)
RBC: 3.74 MIL/uL — ABNORMAL LOW (ref 3.87–5.11)
RDW: 14 % (ref 11.5–15.5)
Smear Review: NORMAL
WBC Count: 2.3 10*3/uL — ABNORMAL LOW (ref 4.0–10.5)
nRBC: 0 % (ref 0.0–0.2)

## 2021-05-19 LAB — SAMPLE TO BLOOD BANK

## 2021-05-19 LAB — MAGNESIUM: Magnesium: 1.9 mg/dL (ref 1.7–2.4)

## 2021-05-19 LAB — CMP (CANCER CENTER ONLY)
ALT: 22 U/L (ref 0–44)
AST: 20 U/L (ref 15–41)
Albumin: 3.6 g/dL (ref 3.5–5.0)
Alkaline Phosphatase: 60 U/L (ref 38–126)
Anion gap: 6 (ref 5–15)
BUN: 15 mg/dL (ref 8–23)
CO2: 25 mmol/L (ref 22–32)
Calcium: 8.6 mg/dL — ABNORMAL LOW (ref 8.9–10.3)
Chloride: 107 mmol/L (ref 98–111)
Creatinine: 0.9 mg/dL (ref 0.44–1.00)
GFR, Estimated: >60 mL/min (ref >60)
Glucose, Bld: 94 mg/dL (ref 70–99)
Potassium: 3.8 mmol/L (ref 3.5–5.1)
Sodium: 138 mmol/L (ref 135–145)
Total Bilirubin: 0.8 mg/dL (ref 0.3–1.2)
Total Protein: 6.3 g/dL — ABNORMAL LOW (ref 6.5–8.1)

## 2021-05-19 LAB — ABO/RH: ABO/RH(D): O POS

## 2021-05-19 MED ORDER — HEPARIN SOD (PORK) LOCK FLUSH 100 UNIT/ML IV SOLN
500.0000 [IU] | Freq: Once | INTRAVENOUS | Status: AC
  Administered 2021-05-19: 500 [IU] via INTRAVENOUS

## 2021-05-19 MED ORDER — FAMOTIDINE IN NACL 20-0.9 MG/50ML-% IV SOLN
20.0000 mg | Freq: Once | INTRAVENOUS | Status: AC
  Administered 2021-05-19: 20 mg via INTRAVENOUS
  Filled 2021-05-19: qty 50

## 2021-05-19 MED ORDER — MORPHINE SULFATE (PF) 2 MG/ML IV SOLN
2.0000 mg | Freq: Once | INTRAVENOUS | Status: AC
  Administered 2021-05-19: 2 mg via INTRAVENOUS
  Filled 2021-05-19: qty 1

## 2021-05-19 MED ORDER — PEGFILGRASTIM-CBQV 6 MG/0.6ML ~~LOC~~ SOSY
6.0000 mg | PREFILLED_SYRINGE | Freq: Once | SUBCUTANEOUS | Status: AC
  Administered 2021-05-19: 6 mg via SUBCUTANEOUS
  Filled 2021-05-19: qty 0.6

## 2021-05-19 MED ORDER — SODIUM CHLORIDE 0.9 % IV SOLN: Freq: Once | INTRAVENOUS | Status: AC

## 2021-05-19 MED ORDER — ONDANSETRON HCL 4 MG/2ML IJ SOLN
8.0000 mg | Freq: Once | INTRAMUSCULAR | Status: AC
  Administered 2021-05-19: 8 mg via INTRAVENOUS
  Filled 2021-05-19: qty 4

## 2021-05-19 MED ORDER — SODIUM CHLORIDE 0.9% FLUSH
10.0000 mL | Freq: Once | INTRAVENOUS | Status: AC
  Administered 2021-05-19: 10 mL via INTRAVENOUS

## 2021-05-19 MED ORDER — SODIUM CHLORIDE 0.9 % IV SOLN: 8.0000 mg | Freq: Once | INTRAVENOUS | Status: DC

## 2021-05-19 NOTE — Patient Instructions (Signed)
Rehydration, Adult Rehydration is the replacement of body fluids, salts, and minerals (electrolytes) that are lost during dehydration. Dehydration is when there is not enough water or other fluids in the body. This happens when you lose more fluids than you take in. Common causes of dehydration include: Not drinking enough fluids. This can occur when you are ill or doing activities that require a lot of energy, especially in hot weather. Conditions that cause loss of water or other fluids, such as diarrhea, vomiting, sweating, or urinating a lot. Other illnesses, such as fever or infection. Certain medicines, such as those that remove excess fluid from the body (diuretics). Symptoms of mild or moderate dehydration may include thirst, dry lips and mouth, and dizziness. Symptoms of severe dehydration may include increased heart rate, confusion, fainting, and not urinating. For severe dehydration, you may need to get fluids through an IV at the hospital. For mild or moderate dehydration, you can usually rehydrate at home by drinking certain fluids as told by your health care provider. What are the risks? Generally, rehydration is safe. However, taking in too much fluid (overhydration) can be a problem. This is rare. Overhydration can cause an electrolyte imbalance, kidney failure, or a decrease in salt (sodium) levels in the body. Supplies needed You will need an oral rehydration solution (ORS) if your health care provider tells you to use one. This is a drink to treat dehydration. It can be found in pharmacies and retail stores. How to rehydrate Fluids Follow instructions from your health care provider for rehydration. The kind of fluid and the amount you should drink depend on your condition. In general, you should choose drinks that you prefer. If told by your health care provider, drink an ORS. Make an ORS by following instructions on the package. Start by drinking small amounts, about  cup (120  mL) every 5-10 minutes. Slowly increase how much you drink until you have taken the amount recommended by your health care provider. Drink enough clear fluids to keep your urine pale yellow. If you were told to drink an ORS, finish it first, then start slowly drinking other clear fluids. Drink fluids such as: Water. This includes sparkling water and flavored water. Drinking only water can lead to having too little sodium in your body (hyponatremia). Follow the advice of your health care provider. Water from ice chips you suck on. Fruit juice with water you add to it (diluted). Sports drinks. Hot or cold herbal teas. Broth-based soups. Milk or milk products. Food Follow instructions from your health care provider about what to eat while you rehydrate. Your health care provider may recommend that you slowly begin eating regular foods in small amounts. Eat foods that contain a healthy balance of electrolytes, such as bananas, oranges, potatoes, tomatoes, and spinach. Avoid foods that are greasy or contain a lot of sugar. In some cases, you may get nutrition through a feeding tube that is passed through your nose and into your stomach (nasogastric tube, or NG tube). This may be done if you have uncontrolled vomiting or diarrhea. Beverages to avoid  Certain beverages may make dehydration worse. While you rehydrate, avoid drinking alcohol. How to tell if you are recovering from dehydration You may be recovering from dehydration if: You are urinating more often than before you started rehydrating. Your urine is pale yellow. Your energy level improves. You vomit less frequently. You have diarrhea less frequently. Your appetite improves or returns to normal. You feel less dizzy or less light-headed.   Your skin tone and color start to look more normal. Follow these instructions at home: Take over-the-counter and prescription medicines only as told by your health care provider. Do not take sodium  tablets. Doing this can lead to having too much sodium in your body (hypernatremia). Contact a health care provider if: You continue to have symptoms of mild or moderate dehydration, such as: Thirst. Dry lips. Slightly dry mouth. Dizziness. Dark urine or less urine than normal. Muscle cramps. You continue to vomit or have diarrhea. Get help right away if you: Have symptoms of dehydration that get worse. Have a fever. Have a severe headache. Have been vomiting and the following happens: Your vomiting gets worse or does not go away. Your vomit includes blood or green matter (bile). You cannot eat or drink without vomiting. Have problems with urination or bowel movements, such as: Diarrhea that gets worse or does not go away. Blood in your stool (feces). This may cause stool to look black and tarry. Not urinating, or urinating only a small amount of very dark urine, within 6-8 hours. Have trouble breathing. Have symptoms that get worse with treatment. These symptoms may represent a serious problem that is an emergency. Do not wait to see if the symptoms will go away. Get medical help right away. Call your local emergency services (911 in the [place] Do not drive yourself to the hospital. Summary Rehydration is the replacement of body fluids and minerals (electrolytes) that are lost during dehydration. Follow instructions from your health care provider for rehydration. The kind of fluid and amount you should drink depend on your condition. Slowly increase how much you drink until you have taken the amount recommended by your health care provider. Contact your health care provider if you continue to show signs of mild or moderate dehydration. This information is not intended to replace advice given to you by your health care provider. Make sure you discuss any questions you have with your health care provider. Document Revised: 02/20/2019 Document Reviewed: 12/31/2018 Elsevier Patient  Education  2023 [employer]  

## 2021-05-19 NOTE — Progress Notes (Signed)
? ? ? ?Symptom Management Consult note ?[place]   ? ?Patient Care Team: ?Catherine Stains, MD as PCP - General (Family Medicine) ?Catherine Kaufmann, RN as Oncology Nurse Navigator ?Catherine Germany, RN as Oncology Nurse Navigator  ? ? ?Name of the patient: Catherine [name]  [id number]  May 11, 1955  ? ?Date of visit: 05/19/2021  ? ? ?Chief complaint/ Reason for visit- nausea, vomiting, diarrhea ? ?Oncology History  ?Malignant neoplasm of lower-outer quadrant of right breast of female, estrogen receptor positive ([place])  ?01/29/2021 Initial Diagnosis  ? Screening mammogram detected right breast mass 1.2 cm, axilla normal, biopsy revealed grade 2 IDC with DCIS ER 90%, PR 30%, HER2 2+ by IHC FISH negative ?  ?02/08/2021 Cancer Staging  ? Staging form: Breast, AJCC 8th Edition ?- Clinical stage from 02/08/2021: Stage IA (cT1c, cN0, cM0, G2, ER+, PR+, HER2: Equivocal) - Signed by Nicholas Lose, MD on 02/08/2021 ?Stage prefix: Initial diagnosis ?Histologic grading system: 3 grade system ? ?  ?03/02/2021 Surgery  ? Right lumpectomy: Grade 2 IDC 1.2 cm with intermediate grade DCIS, focal involvement of anterior medial margin junction, 1/4 lymph nodes positive ?  ?03/19/2021 Oncotype testing  ? Oncotype DX score: 28.  Distant recurrence at 10 years: 17% ?  ?03/24/2021 Cancer Staging  ? Staging form: Breast, AJCC 8th Edition ?- Pathologic: Stage IA (pT1c, pN1(sn), cM0, G2, ER+, PR+, HER2-, Oncotype DX score: 28) - Signed by Nicholas Lose, MD on 03/24/2021 ?Method of lymph node assessment: Sentinel lymph node biopsy ?Multigene prognostic tests performed: Oncotype DX ?Recurrence score range: Greater than or equal to 11 ?Histologic grading system: 3 grade system ? ?  ?04/23/2021 -  Chemotherapy  ? Patient is on Treatment Plan : BREAST TC q21d  ? ?   ? ? ?Current Therapy: day 1 cycle 2 cyclophosphamide, docetaxel on 05/14/21, pegfilgrastim-cbqz today ? ?Interval history- Mayvis [name] is a 66 yo female with oncologic history as above  seen in the infusion center today with chief complaint of nausea, vomiting, diarrhea x 5 days.  Symptoms have been progressively worsening since her last treatment.  She is reporting 3 episodes of diarrhea daily.  She describes stool as soft.  Denies any blood in stool.  She is also endorsing nausea and emesis with 3 episodes of nonbloody nonbilious emesis in the last 24 hours.  She has been taking Zofran and Compazine at home without symptom improvement.  Patient reports she feels extremely weak and fatigued.  She was so dizzy this morning her spouse had to help her get dressed and get into the wheelchair.  She has had no appetite and barely had anything to eat since symptoms started.  She admits to eating crackers yesterday.  She denies any sick contacts.  Denies fever, chills, visual changes, chest pain, shortness of breath, abdominal pain, urinary symptoms.  She states she was sent here for IV fluids day of symptom onset although does not feel they improved her symptoms.Denies any recent antibiotic use or travel.  ? ? ? ? ?ROS  ?All other systems are reviewed and are negative for acute change except as noted in the HPI. ? ? ? ?Allergies  ?Allergen Reactions  ? Penicillins Hives  ? ? ? ?Past Medical History:  ?Diagnosis Date  ? Anxiety   ? Cancer [facility])   ? right breast cancer  ? High cholesterol   ? History of kidney stones   ? Hypertension   ? Hypothyroidism   ? ? ? ?Past Surgical  History:  ?Procedure Laterality Date  ? ABDOMINAL HYSTERECTOMY    ? AXILLARY SENTINEL NODE BIOPSY Right 03/02/2021  ? Procedure: RIGHT AXILLARY SENTINEL NODE BIOPSY;  Surgeon: Donnie Mesa, MD;  Location: [place];  Service: General;  Laterality: Right;  ? BREAST LUMPECTOMY WITH RADIOACTIVE SEED AND SENTINEL LYMPH NODE BIOPSY Right 03/02/2021  ? Procedure: RIGHT BREAST LUMPECTOMY WITH RADIOACTIVE SEED;  Surgeon: Donnie Mesa, MD;  Location: [place];  Service: General;  Laterality: Right;  ? KIDNEY SURGERY Right   ? NECK SURGERY    ?  PORTACATH PLACEMENT N/A 03/31/2021  ? Procedure: INSERTION PORT-A-CATH;  Surgeon: Donnie Mesa, MD;  Location: [place];  Service: General;  Laterality: N/A;  ? RE-EXCISION OF BREAST LUMPECTOMY Right 03/31/2021  ? Procedure: RE-EXCISION OF RIGHT BREAST LUMPECTOMY MARGINS;  Surgeon: Donnie Mesa, MD;  Location: [place];  Service: General;  Laterality: Right;  ? WISDOM TOOTH EXTRACTION    ? ? ?Social History  ? ?Socioeconomic History  ? Marital status: Married  ?  Spouse name: Not on file  ? Number of children: Not on file  ? Years of education: Not on file  ? Highest education level: Not on file  ?Occupational History  ? Not on file  ?Tobacco Use  ? Smoking status: Never  ? Smokeless tobacco: Never  ?Vaping Use  ? Vaping Use: Never used  ?Substance and Sexual Activity  ? Alcohol use: No  ? Drug use: No  ? Sexual activity: Yes  ?  Birth control/protection: Surgical  ?Other Topics Concern  ? Not on file  ?Social History Narrative  ? Not on file  ? ?Social Determinants of Health  ? ?Financial Resource Strain: Not on file  ?Food Insecurity: Not on file  ?Transportation Needs: Not on file  ?Physical Activity: Not on file  ?Stress: Not on file  ?Social Connections: Not on file  ?Intimate Partner Violence: Not on file  ? ? ?No family history on file. ? ? ?Current Outpatient Medications:  ?  acetaminophen (TYLENOL) 500 MG tablet, Take 1,000 mg by mouth every 6 (six) hours as needed for moderate pain., Disp: , Rfl:  ?  Calcium Magnesium Zinc 333-133-5 MG TABS, Take 1 tablet by mouth daily. (Patient taking differently: Take 1 tablet by mouth once a week.), Disp: , Rfl:  ?  dexamethasone (DECADRON) 4 MG tablet, Take 1 tablet (4 mg total) by mouth daily. Take 1 tablet day before chemo and 1 tablet day after chemo with food, Disp: 8 tablet, Rfl: 0 ?  diphenhydrAMINE (BENADRYL) 25 MG tablet, Take 25 mg by mouth every 6 (six) hours as needed for allergies., Disp: , Rfl:  ?  ergocalciferol (VITAMIN D2) 1.25 MG (50000 UT) capsule, Take  1 capsule (50,000 Units total) by mouth once a week., Disp: , Rfl:  ?  escitalopram (LEXAPRO) 5 MG tablet, Take 5 mg by mouth once a week., Disp: , Rfl:  ?  levothyroxine (SYNTHROID) 50 MCG tablet, Take 1 tablet (50 mcg total) by mouth daily before breakfast., Disp: , Rfl:  ?  lidocaine-prilocaine (EMLA) cream, Apply to affected area once, Disp: 30 g, Rfl: 3 ?  metoprolol tartrate (LOPRESSOR) 25 MG tablet, Take 12.5 mg by mouth 2 (two) times daily., Disp: , Rfl:  ?  ondansetron (ZOFRAN) 8 MG tablet, Take 1 tablet (8 mg total) by mouth 2 (two) times daily as needed for refractory nausea / vomiting. Start on day 3 after chemo., Disp: 30 tablet, Rfl: 1 ?  prochlorperazine (COMPAZINE) 10  MG tablet, TAKE 1 TABLET(10 MG) BY MOUTH EVERY 6 HOURS AS NEEDED FOR NAUSEA OR VOMITING, Disp: 30 tablet, Rfl: 1 ?  rosuvastatin (CRESTOR) 5 MG tablet, Take 1 tablet (5 mg total) by mouth daily., Disp: , Rfl:  ? ?Current Facility-Administered Medications:  ?  ondansetron (ZOFRAN) injection 8 mg, 8 mg, Intravenous, Once, Nicholas Lose, MD ? ?Facility-Administered Medications Ordered in Other Catherine:  ?  ondansetron (ZOFRAN) 4 MG/2ML injection, , , ,  ?  ondansetron (ZOFRAN) 4 MG/2ML injection, , , ,  ? ?PHYSICAL EXAM: ?ECOG FS:2 - Symptomatic, <50% confined to bed ? ?  ?Vitals:  ? 05/19/21 1429  ?BP: 96/70  ?Pulse: (!) 101  ?Resp: 18  ?Temp: 98.3 ?F (36.8 ?C)  ?SpO2: 100%  ? ?Physical Exam ?Vitals and nursing note reviewed.  ?Constitutional:   ?   Appearance: She is well-developed. She is not ill-appearing or toxic-appearing.  ?   Comments: Looks to feel unwell, nontoxic  ?HENT:  ?   Head: Normocephalic and atraumatic.  ?   Right Ear: External ear normal.  ?   Left Ear: External ear normal.  ?   Nose: Nose normal.  ?   Mouth/Throat:  ?   Mouth: Mucous membranes are dry.  ?Eyes:  ?   General: No scleral icterus.    ?   Right eye: No discharge.     ?   Left eye: No discharge.  ?   Conjunctiva/sclera: Conjunctivae normal.  ?Neck:  ?    Vascular: No JVD.  ?Cardiovascular:  ?   Rate and Rhythm: Normal rate and regular rhythm.  ?   Pulses: Normal pulses.  ?   Heart sounds: Normal heart sounds.  ?Pulmonary:  ?   Effort: Pulmonary effort is normal.

## 2021-05-20 ENCOUNTER — Other Ambulatory Visit: Payer: Self-pay | Admitting: Physician Assistant

## 2021-05-20 DIAGNOSIS — R112 Nausea with vomiting, unspecified: Secondary | ICD-10-CM

## 2021-05-21 ENCOUNTER — Inpatient Hospital Stay: Payer: Medicare PPO

## 2021-05-21 ENCOUNTER — Other Ambulatory Visit: Payer: Self-pay

## 2021-05-21 DIAGNOSIS — R519 Headache, unspecified: Secondary | ICD-10-CM | POA: Diagnosis not present

## 2021-05-21 DIAGNOSIS — Z170 Estrogen receptor positive status [ER+]: Secondary | ICD-10-CM | POA: Diagnosis not present

## 2021-05-21 DIAGNOSIS — R112 Nausea with vomiting, unspecified: Secondary | ICD-10-CM

## 2021-05-21 DIAGNOSIS — R5383 Other fatigue: Secondary | ICD-10-CM | POA: Diagnosis not present

## 2021-05-21 DIAGNOSIS — E860 Dehydration: Secondary | ICD-10-CM | POA: Diagnosis not present

## 2021-05-21 DIAGNOSIS — Z5189 Encounter for other specified aftercare: Secondary | ICD-10-CM | POA: Diagnosis not present

## 2021-05-21 DIAGNOSIS — Z79899 Other long term (current) drug therapy: Secondary | ICD-10-CM | POA: Diagnosis not present

## 2021-05-21 DIAGNOSIS — R109 Unspecified abdominal pain: Secondary | ICD-10-CM | POA: Diagnosis not present

## 2021-05-21 DIAGNOSIS — Z5111 Encounter for antineoplastic chemotherapy: Secondary | ICD-10-CM | POA: Diagnosis not present

## 2021-05-21 DIAGNOSIS — C50511 Malignant neoplasm of lower-outer quadrant of right female breast: Secondary | ICD-10-CM | POA: Diagnosis not present

## 2021-05-21 MED ORDER — SODIUM CHLORIDE 0.9% FLUSH
10.0000 mL | Freq: Once | INTRAVENOUS | Status: AC
  Administered 2021-05-21: 10 mL via INTRAVENOUS

## 2021-05-21 MED ORDER — ACETAMINOPHEN 325 MG PO TABS
650.0000 mg | ORAL_TABLET | Freq: Once | ORAL | Status: AC
  Administered 2021-05-21: 650 mg via ORAL
  Filled 2021-05-21: qty 2

## 2021-05-21 MED ORDER — ONDANSETRON HCL 4 MG/2ML IJ SOLN
4.0000 mg | Freq: Once | INTRAMUSCULAR | Status: AC
  Administered 2021-05-21: 4 mg via INTRAVENOUS
  Filled 2021-05-21: qty 2

## 2021-05-21 MED ORDER — SODIUM CHLORIDE 0.9 % IV SOLN: Freq: Once | INTRAVENOUS | Status: AC

## 2021-05-21 MED ORDER — HEPARIN SOD (PORK) LOCK FLUSH 100 UNIT/ML IV SOLN
500.0000 [IU] | Freq: Once | INTRAVENOUS | Status: AC
  Administered 2021-05-21: 500 [IU] via INTRAVENOUS

## 2021-05-21 MED ORDER — FAMOTIDINE IN NACL 20-0.9 MG/50ML-% IV SOLN
20.0000 mg | Freq: Once | INTRAVENOUS | Status: AC
  Administered 2021-05-21: 20 mg via INTRAVENOUS
  Filled 2021-05-21: qty 50

## 2021-05-21 NOTE — Patient Instructions (Signed)
Rehydration, Adult Rehydration is the replacement of body fluids, salts, and minerals (electrolytes) that are lost during dehydration. Dehydration is when there is not enough water or other fluids in the body. This happens when you lose more fluids than you take in. Common causes of dehydration include: Not drinking enough fluids. This can occur when you are ill or doing activities that require a lot of energy, especially in hot weather. Conditions that cause loss of water or other fluids, such as diarrhea, vomiting, sweating, or urinating a lot. Other illnesses, such as fever or infection. Certain medicines, such as those that remove excess fluid from the body (diuretics). Symptoms of mild or moderate dehydration may include thirst, dry lips and mouth, and dizziness. Symptoms of severe dehydration may include increased heart rate, confusion, fainting, and not urinating. For severe dehydration, you may need to get fluids through an IV at the hospital. For mild or moderate dehydration, you can usually rehydrate at home by drinking certain fluids as told by your health care provider. What are the risks? Generally, rehydration is safe. However, taking in too much fluid (overhydration) can be a problem. This is rare. Overhydration can cause an electrolyte imbalance, kidney failure, or a decrease in salt (sodium) levels in the body. Supplies needed You will need an oral rehydration solution (ORS) if your health care provider tells you to use one. This is a drink to treat dehydration. It can be found in pharmacies and retail stores. How to rehydrate Fluids Follow instructions from your health care provider for rehydration. The kind of fluid and the amount you should drink depend on your condition. In general, you should choose drinks that you prefer. If told by your health care provider, drink an ORS. Make an ORS by following instructions on the package. Start by drinking small amounts, about  cup (120  mL) every 5-10 minutes. Slowly increase how much you drink until you have taken the amount recommended by your health care provider. Drink enough clear fluids to keep your urine pale yellow. If you were told to drink an ORS, finish it first, then start slowly drinking other clear fluids. Drink fluids such as: Water. This includes sparkling water and flavored water. Drinking only water can lead to having too little sodium in your body (hyponatremia). Follow the advice of your health care provider. Water from ice chips you suck on. Fruit juice with water you add to it (diluted). Sports drinks. Hot or cold herbal teas. Broth-based soups. Milk or milk products. Food Follow instructions from your health care provider about what to eat while you rehydrate. Your health care provider may recommend that you slowly begin eating regular foods in small amounts. Eat foods that contain a healthy balance of electrolytes, such as bananas, oranges, potatoes, tomatoes, and spinach. Avoid foods that are greasy or contain a lot of sugar. In some cases, you may get nutrition through a feeding tube that is passed through your nose and into your stomach (nasogastric tube, or NG tube). This may be done if you have uncontrolled vomiting or diarrhea. Beverages to avoid  Certain beverages may make dehydration worse. While you rehydrate, avoid drinking alcohol. How to tell if you are recovering from dehydration You may be recovering from dehydration if: You are urinating more often than before you started rehydrating. Your urine is pale yellow. Your energy level improves. You vomit less frequently. You have diarrhea less frequently. Your appetite improves or returns to normal. You feel less dizzy or less light-headed.   Your skin tone and color start to look more normal. Follow these instructions at home: Take over-the-counter and prescription medicines only as told by your health care provider. Do not take sodium  tablets. Doing this can lead to having too much sodium in your body (hypernatremia). Contact a health care provider if: You continue to have symptoms of mild or moderate dehydration, such as: Thirst. Dry lips. Slightly dry mouth. Dizziness. Dark urine or less urine than normal. Muscle cramps. You continue to vomit or have diarrhea. Get help right away if you: Have symptoms of dehydration that get worse. Have a fever. Have a severe headache. Have been vomiting and the following happens: Your vomiting gets worse or does not go away. Your vomit includes blood or green matter (bile). You cannot eat or drink without vomiting. Have problems with urination or bowel movements, such as: Diarrhea that gets worse or does not go away. Blood in your stool (feces). This may cause stool to look black and tarry. Not urinating, or urinating only a small amount of very dark urine, within 6-8 hours. Have trouble breathing. Have symptoms that get worse with treatment. These symptoms may represent a serious problem that is an emergency. Do not wait to see if the symptoms will go away. Get medical help right away. Call your local emergency services (911 in the [place] Do not drive yourself to the hospital. Summary Rehydration is the replacement of body fluids and minerals (electrolytes) that are lost during dehydration. Follow instructions from your health care provider for rehydration. The kind of fluid and amount you should drink depend on your condition. Slowly increase how much you drink until you have taken the amount recommended by your health care provider. Contact your health care provider if you continue to show signs of mild or moderate dehydration. This information is not intended to replace advice given to you by your health care provider. Make sure you discuss any questions you have with your health care provider. Document Revised: 02/20/2019 Document Reviewed: 12/31/2018 Elsevier Patient  Education  2023 [employer]  

## 2021-05-25 ENCOUNTER — Other Ambulatory Visit: Payer: Self-pay | Admitting: Hematology and Oncology

## 2021-05-25 ENCOUNTER — Other Ambulatory Visit: Payer: Self-pay | Admitting: *Deleted

## 2021-05-25 ENCOUNTER — Telehealth: Payer: Self-pay | Admitting: *Deleted

## 2021-05-25 DIAGNOSIS — R112 Nausea with vomiting, unspecified: Secondary | ICD-10-CM

## 2021-05-25 MED ORDER — DIPHENOXYLATE-ATROPINE 2.5-0.025 MG PO TABS: 1.0000 | ORAL_TABLET | Freq: Four times a day (QID) | ORAL | 1 refills | Status: DC | PRN

## 2021-05-25 NOTE — Telephone Encounter (Signed)
Received call from pt with complaint of ongoing diarrhea and dehydration.  Pt states diarrhea is not alleviated by OTC imodium. Per MD pt needing infusion appt for 1 L NS.  MD also states a prescription for Lomotil will be sent to pharmacy on file. Orders placed, appt scheduled and pt verbalized understanding.

## 2021-05-26 ENCOUNTER — Other Ambulatory Visit: Payer: Self-pay

## 2021-05-26 ENCOUNTER — Inpatient Hospital Stay: Payer: Medicare PPO

## 2021-05-26 DIAGNOSIS — R112 Nausea with vomiting, unspecified: Secondary | ICD-10-CM

## 2021-05-26 DIAGNOSIS — Z79899 Other long term (current) drug therapy: Secondary | ICD-10-CM | POA: Diagnosis not present

## 2021-05-26 DIAGNOSIS — Z170 Estrogen receptor positive status [ER+]: Secondary | ICD-10-CM | POA: Diagnosis not present

## 2021-05-26 DIAGNOSIS — E860 Dehydration: Secondary | ICD-10-CM | POA: Diagnosis not present

## 2021-05-26 DIAGNOSIS — Z5111 Encounter for antineoplastic chemotherapy: Secondary | ICD-10-CM | POA: Diagnosis not present

## 2021-05-26 DIAGNOSIS — Z5189 Encounter for other specified aftercare: Secondary | ICD-10-CM | POA: Diagnosis not present

## 2021-05-26 DIAGNOSIS — R519 Headache, unspecified: Secondary | ICD-10-CM | POA: Diagnosis not present

## 2021-05-26 DIAGNOSIS — R109 Unspecified abdominal pain: Secondary | ICD-10-CM | POA: Diagnosis not present

## 2021-05-26 DIAGNOSIS — R5383 Other fatigue: Secondary | ICD-10-CM | POA: Diagnosis not present

## 2021-05-26 DIAGNOSIS — C50511 Malignant neoplasm of lower-outer quadrant of right female breast: Secondary | ICD-10-CM | POA: Diagnosis not present

## 2021-05-26 MED ORDER — SODIUM CHLORIDE 0.9% FLUSH
10.0000 mL | Freq: Once | INTRAVENOUS | Status: AC
  Administered 2021-05-26: 10 mL via INTRAVENOUS

## 2021-05-26 MED ORDER — SODIUM CHLORIDE 0.9 % IV SOLN: Freq: Once | INTRAVENOUS | Status: AC

## 2021-05-26 MED ORDER — ONDANSETRON HCL 4 MG/2ML IJ SOLN
8.0000 mg | Freq: Once | INTRAMUSCULAR | Status: AC
  Administered 2021-05-26: 8 mg via INTRAVENOUS
  Filled 2021-05-26: qty 4

## 2021-05-26 MED ORDER — HEPARIN SOD (PORK) LOCK FLUSH 100 UNIT/ML IV SOLN
500.0000 [IU] | Freq: Once | INTRAVENOUS | Status: AC
  Administered 2021-05-26: 500 [IU] via INTRAVENOUS

## 2021-05-26 MED ORDER — SODIUM CHLORIDE 0.9 % IV SOLN: 8.0000 mg | Freq: Once | INTRAVENOUS | Status: DC

## 2021-05-26 NOTE — Patient Instructions (Signed)
Rehydration, Adult Rehydration is the replacement of body fluids, salts, and minerals (electrolytes) that are lost during dehydration. Dehydration is when there is not enough water or other fluids in the body. This happens when you lose more fluids than you take in. Common causes of dehydration include: Not drinking enough fluids. This can occur when you are ill or doing activities that require a lot of energy, especially in hot weather. Conditions that cause loss of water or other fluids, such as diarrhea, vomiting, sweating, or urinating a lot. Other illnesses, such as fever or infection. Certain medicines, such as those that remove excess fluid from the body (diuretics). Symptoms of mild or moderate dehydration may include thirst, dry lips and mouth, and dizziness. Symptoms of severe dehydration may include increased heart rate, confusion, fainting, and not urinating. For severe dehydration, you may need to get fluids through an IV at the hospital. For mild or moderate dehydration, you can usually rehydrate at home by drinking certain fluids as told by your health care provider. What are the risks? Generally, rehydration is safe. However, taking in too much fluid (overhydration) can be a problem. This is rare. Overhydration can cause an electrolyte imbalance, kidney failure, or a decrease in salt (sodium) levels in the body. Supplies needed You will need an oral rehydration solution (ORS) if your health care provider tells you to use one. This is a drink to treat dehydration. It can be found in pharmacies and retail stores. How to rehydrate Fluids Follow instructions from your health care provider for rehydration. The kind of fluid and the amount you should drink depend on your condition. In general, you should choose drinks that you prefer. If told by your health care provider, drink an ORS. Make an ORS by following instructions on the package. Start by drinking small amounts, about  cup (120  mL) every 5-10 minutes. Slowly increase how much you drink until you have taken the amount recommended by your health care provider. Drink enough clear fluids to keep your urine pale yellow. If you were told to drink an ORS, finish it first, then start slowly drinking other clear fluids. Drink fluids such as: Water. This includes sparkling water and flavored water. Drinking only water can lead to having too little sodium in your body (hyponatremia). Follow the advice of your health care provider. Water from ice chips you suck on. Fruit juice with water you add to it (diluted). Sports drinks. Hot or cold herbal teas. Broth-based soups. Milk or milk products. Food Follow instructions from your health care provider about what to eat while you rehydrate. Your health care provider may recommend that you slowly begin eating regular foods in small amounts. Eat foods that contain a healthy balance of electrolytes, such as bananas, oranges, potatoes, tomatoes, and spinach. Avoid foods that are greasy or contain a lot of sugar. In some cases, you may get nutrition through a feeding tube that is passed through your nose and into your stomach (nasogastric tube, or NG tube). This may be done if you have uncontrolled vomiting or diarrhea. Beverages to avoid  Certain beverages may make dehydration worse. While you rehydrate, avoid drinking alcohol. How to tell if you are recovering from dehydration You may be recovering from dehydration if: You are urinating more often than before you started rehydrating. Your urine is pale yellow. Your energy level improves. You vomit less frequently. You have diarrhea less frequently. Your appetite improves or returns to normal. You feel less dizzy or less light-headed.   Your skin tone and color start to look more normal. Follow these instructions at home: Take over-the-counter and prescription medicines only as told by your health care provider. Do not take sodium  tablets. Doing this can lead to having too much sodium in your body (hypernatremia). Contact a health care provider if: You continue to have symptoms of mild or moderate dehydration, such as: Thirst. Dry lips. Slightly dry mouth. Dizziness. Dark urine or less urine than normal. Muscle cramps. You continue to vomit or have diarrhea. Get help right away if you: Have symptoms of dehydration that get worse. Have a fever. Have a severe headache. Have been vomiting and the following happens: Your vomiting gets worse or does not go away. Your vomit includes blood or green matter (bile). You cannot eat or drink without vomiting. Have problems with urination or bowel movements, such as: Diarrhea that gets worse or does not go away. Blood in your stool (feces). This may cause stool to look black and tarry. Not urinating, or urinating only a small amount of very dark urine, within 6-8 hours. Have trouble breathing. Have symptoms that get worse with treatment. These symptoms may represent a serious problem that is an emergency. Do not wait to see if the symptoms will go away. Get medical help right away. Call your local emergency services (911 in the [place] Do not drive yourself to the hospital. Summary Rehydration is the replacement of body fluids and minerals (electrolytes) that are lost during dehydration. Follow instructions from your health care provider for rehydration. The kind of fluid and amount you should drink depend on your condition. Slowly increase how much you drink until you have taken the amount recommended by your health care provider. Contact your health care provider if you continue to show signs of mild or moderate dehydration. This information is not intended to replace advice given to you by your health care provider. Make sure you discuss any questions you have with your health care provider. Document Revised: 02/20/2019 Document Reviewed: 12/31/2018 Elsevier Patient  Education  2023 [employer]  

## 2021-05-28 ENCOUNTER — Inpatient Hospital Stay: Payer: Medicare PPO | Admitting: Hematology and Oncology

## 2021-05-28 ENCOUNTER — Inpatient Hospital Stay: Payer: Medicare PPO

## 2021-05-30 ENCOUNTER — Other Ambulatory Visit: Payer: Self-pay | Admitting: Hematology and Oncology

## 2021-05-30 DIAGNOSIS — Z170 Estrogen receptor positive status [ER+]: Secondary | ICD-10-CM

## 2021-06-01 ENCOUNTER — Inpatient Hospital Stay: Payer: Medicare PPO

## 2021-06-03 MED FILL — Dexamethasone Sodium Phosphate Inj 100 MG/10ML: INTRAMUSCULAR | Qty: 1 | Status: AC

## 2021-06-03 MED FILL — Fosaprepitant Dimeglumine For IV Infusion 150 MG (Base Eq): INTRAVENOUS | Qty: 5 | Status: AC

## 2021-06-04 ENCOUNTER — Other Ambulatory Visit: Payer: Self-pay

## 2021-06-04 ENCOUNTER — Inpatient Hospital Stay: Payer: Medicare PPO

## 2021-06-04 ENCOUNTER — Inpatient Hospital Stay (HOSPITAL_BASED_OUTPATIENT_CLINIC_OR_DEPARTMENT_OTHER): Payer: Medicare PPO | Admitting: Adult Health

## 2021-06-04 ENCOUNTER — Inpatient Hospital Stay: Payer: Medicare PPO | Attending: Hematology and Oncology

## 2021-06-04 ENCOUNTER — Encounter: Payer: Self-pay | Admitting: Adult Health

## 2021-06-04 ENCOUNTER — Inpatient Hospital Stay: Payer: Medicare PPO | Admitting: Dietician

## 2021-06-04 VITALS — BP 132/79 | HR 76 | Temp 98.6°F | Resp 16 | Ht 63.0 in | Wt 176.4 lb

## 2021-06-04 DIAGNOSIS — R5383 Other fatigue: Secondary | ICD-10-CM | POA: Insufficient documentation

## 2021-06-04 DIAGNOSIS — Z170 Estrogen receptor positive status [ER+]: Secondary | ICD-10-CM | POA: Diagnosis not present

## 2021-06-04 DIAGNOSIS — M79671 Pain in right foot: Secondary | ICD-10-CM | POA: Insufficient documentation

## 2021-06-04 DIAGNOSIS — E785 Hyperlipidemia, unspecified: Secondary | ICD-10-CM | POA: Insufficient documentation

## 2021-06-04 DIAGNOSIS — Z5111 Encounter for antineoplastic chemotherapy: Secondary | ICD-10-CM | POA: Diagnosis not present

## 2021-06-04 DIAGNOSIS — E860 Dehydration: Secondary | ICD-10-CM | POA: Diagnosis not present

## 2021-06-04 DIAGNOSIS — R112 Nausea with vomiting, unspecified: Secondary | ICD-10-CM | POA: Diagnosis not present

## 2021-06-04 DIAGNOSIS — E039 Hypothyroidism, unspecified: Secondary | ICD-10-CM | POA: Insufficient documentation

## 2021-06-04 DIAGNOSIS — Z5189 Encounter for other specified aftercare: Secondary | ICD-10-CM | POA: Insufficient documentation

## 2021-06-04 DIAGNOSIS — R109 Unspecified abdominal pain: Secondary | ICD-10-CM | POA: Insufficient documentation

## 2021-06-04 DIAGNOSIS — E559 Vitamin D deficiency, unspecified: Secondary | ICD-10-CM | POA: Insufficient documentation

## 2021-06-04 DIAGNOSIS — Z79899 Other long term (current) drug therapy: Secondary | ICD-10-CM | POA: Insufficient documentation

## 2021-06-04 DIAGNOSIS — C50511 Malignant neoplasm of lower-outer quadrant of right female breast: Secondary | ICD-10-CM

## 2021-06-04 DIAGNOSIS — F331 Major depressive disorder, recurrent, moderate: Secondary | ICD-10-CM | POA: Insufficient documentation

## 2021-06-04 DIAGNOSIS — Z95828 Presence of other vascular implants and grafts: Secondary | ICD-10-CM

## 2021-06-04 DIAGNOSIS — M8588 Other specified disorders of bone density and structure, other site: Secondary | ICD-10-CM | POA: Insufficient documentation

## 2021-06-04 LAB — CMP (CANCER CENTER ONLY)
ALT: 11 U/L (ref 0–44)
AST: 15 U/L (ref 15–41)
Albumin: 3.8 g/dL (ref 3.5–5.0)
Alkaline Phosphatase: 71 U/L (ref 38–126)
Anion gap: 5 (ref 5–15)
BUN: 11 mg/dL (ref 8–23)
CO2: 27 mmol/L (ref 22–32)
Calcium: 9.3 mg/dL (ref 8.9–10.3)
Chloride: 107 mmol/L (ref 98–111)
Creatinine: 0.77 mg/dL (ref 0.44–1.00)
GFR, Estimated: >60 mL/min (ref >60)
Glucose, Bld: 108 mg/dL — ABNORMAL HIGH (ref 70–99)
Potassium: 4 mmol/L (ref 3.5–5.1)
Sodium: 139 mmol/L (ref 135–145)
Total Bilirubin: 0.3 mg/dL (ref 0.3–1.2)
Total Protein: 6.3 g/dL — ABNORMAL LOW (ref 6.5–8.1)

## 2021-06-04 LAB — CBC WITH DIFFERENTIAL (CANCER CENTER ONLY)
Abs Immature Granulocytes: 0.01 10*3/uL (ref 0.00–0.07)
Basophils Absolute: 0 10*3/uL (ref 0.0–0.1)
Basophils Relative: 1 %
Eosinophils Absolute: 0 10*3/uL (ref 0.0–0.5)
Eosinophils Relative: 0 %
HCT: 32.7 % — ABNORMAL LOW (ref 36.0–46.0)
Hemoglobin: 10.6 g/dL — ABNORMAL LOW (ref 12.0–15.0)
Immature Granulocytes: 0 %
Lymphocytes Relative: 25 %
Lymphs Abs: 0.8 10*3/uL (ref 0.7–4.0)
MCH: 27.7 pg (ref 26.0–34.0)
MCHC: 32.4 g/dL (ref 30.0–36.0)
MCV: 85.4 fL (ref 80.0–100.0)
Monocytes Absolute: 0.4 10*3/uL (ref 0.1–1.0)
Monocytes Relative: 11 %
Neutro Abs: 2.1 10*3/uL (ref 1.7–7.7)
Neutrophils Relative %: 63 %
Platelet Count: 248 10*3/uL (ref 150–400)
RBC: 3.83 MIL/uL — ABNORMAL LOW (ref 3.87–5.11)
RDW: 16 % — ABNORMAL HIGH (ref 11.5–15.5)
WBC Count: 3.3 10*3/uL — ABNORMAL LOW (ref 4.0–10.5)
nRBC: 0 % (ref 0.0–0.2)

## 2021-06-04 MED ORDER — SODIUM CHLORIDE 0.9 % IV SOLN
10.0000 mg | Freq: Once | INTRAVENOUS | Status: AC
  Administered 2021-06-04: 10 mg via INTRAVENOUS
  Filled 2021-06-04: qty 10

## 2021-06-04 MED ORDER — SODIUM CHLORIDE 0.9 % IV SOLN
65.0000 mg/m2 | Freq: Once | INTRAVENOUS | Status: AC
  Administered 2021-06-04: 130 mg via INTRAVENOUS
  Filled 2021-06-04: qty 13

## 2021-06-04 MED ORDER — SODIUM CHLORIDE 0.9 % IV SOLN
400.0000 mg/m2 | Freq: Once | INTRAVENOUS | Status: AC
  Administered 2021-06-04: 780 mg via INTRAVENOUS
  Filled 2021-06-04: qty 39

## 2021-06-04 MED ORDER — SODIUM CHLORIDE 0.9% FLUSH
10.0000 mL | Freq: Once | INTRAVENOUS | Status: AC
  Administered 2021-06-04: 10 mL

## 2021-06-04 MED ORDER — HEPARIN SOD (PORK) LOCK FLUSH 100 UNIT/ML IV SOLN
500.0000 [IU] | Freq: Once | INTRAVENOUS | Status: AC | PRN
  Administered 2021-06-04: 500 [IU]

## 2021-06-04 MED ORDER — SODIUM CHLORIDE 0.9 % IV SOLN: Freq: Once | INTRAVENOUS | Status: AC

## 2021-06-04 MED ORDER — SODIUM CHLORIDE 0.9% FLUSH
10.0000 mL | INTRAVENOUS | Status: DC | PRN
  Administered 2021-06-04: 10 mL

## 2021-06-04 MED ORDER — SODIUM CHLORIDE 0.9 % IV SOLN: 8.0000 mg | Freq: Once | INTRAVENOUS | Status: DC

## 2021-06-04 MED ORDER — SODIUM CHLORIDE 0.9 % IV SOLN
150.0000 mg | Freq: Once | INTRAVENOUS | Status: AC
  Administered 2021-06-04: 150 mg via INTRAVENOUS
  Filled 2021-06-04: qty 150

## 2021-06-04 MED ORDER — PALONOSETRON HCL INJECTION 0.25 MG/5ML
0.2500 mg | Freq: Once | INTRAVENOUS | Status: AC
  Administered 2021-06-04: 0.25 mg via INTRAVENOUS
  Filled 2021-06-04: qty 5

## 2021-06-04 MED ORDER — DEXAMETHASONE 4 MG PO TABS: 4.0000 mg | ORAL_TABLET | Freq: Every day | ORAL | 0 refills | Status: DC

## 2021-06-04 NOTE — Patient Instructions (Signed)
[facility]  Discharge Instructions: Thank you for choosing [facility] to provide your oncology and hematology care.   If you have a lab appointment with the Cancer Center, please go directly to the Cancer Center and check in at the registration area.   Wear comfortable clothing and clothing appropriate for easy access to any Portacath or PICC line.   We strive to give you quality time with your provider. You may need to reschedule your appointment if you arrive late (15 or more minutes).  Arriving late affects you and other patients whose appointments are after yours.  Also, if you miss three or more appointments without notifying the office, you may be dismissed from the clinic at the provider's discretion.      For prescription refill requests, have your pharmacy contact our office and allow 72 hours for refills to be completed.    Today you received the following chemotherapy and/or immunotherapy agents : Taxotere, Cytoxan     To help prevent nausea and vomiting after your treatment, we encourage you to take your nausea medication as directed.  BELOW ARE SYMPTOMS THAT SHOULD BE REPORTED IMMEDIATELY: *FEVER GREATER THAN 100.4 F (38 C) OR HIGHER *CHILLS OR SWEATING *NAUSEA AND VOMITING THAT IS NOT CONTROLLED WITH YOUR NAUSEA MEDICATION *UNUSUAL SHORTNESS OF BREATH *UNUSUAL BRUISING OR BLEEDING *URINARY PROBLEMS (pain or burning when urinating, or frequent urination) *BOWEL PROBLEMS (unusual diarrhea, constipation, pain near the anus) TENDERNESS IN MOUTH AND THROAT WITH OR WITHOUT PRESENCE OF ULCERS (sore throat, sores in mouth, or a toothache) UNUSUAL RASH, SWELLING OR PAIN  UNUSUAL VAGINAL DISCHARGE OR ITCHING   Items with * indicate a potential emergency and should be followed up as soon as possible or go to the Emergency Department if any problems should occur.  Please show the CHEMOTHERAPY ALERT CARD or IMMUNOTHERAPY ALERT CARD at  check-in to the Emergency Department and triage nurse.  Should you have questions after your visit or need to cancel or reschedule your appointment, please contact [facility]  Dept: [phone]  and follow the prompts.  Office hours are 8:00 a.m. to 4:30 p.m. Monday - Friday. Please note that voicemails left after 4:00 p.m. may not be returned until the following business day.  We are closed weekends and major holidays. You have access to a nurse at all times for urgent questions. Please call the main number to the clinic Dept: [phone] and follow the prompts.   For any non-urgent questions, you may also contact your provider using MyChart. We now offer e-Visits for anyone 18 and older to request care online for non-urgent symptoms. For details visit [email or web address].   Also download the MyChart app! Go to the app store, search "MyChart", open the app, select [facility], and log in with your MyChart username and password.  Due to Covid, a mask is required upon entering the hospital/clinic. If you do not have a mask, one will be given to you upon arrival. For doctor visits, patients may have 1 support person aged 18 or older with them. For treatment visits, patients cannot have anyone with them due to current Covid guidelines and our immunocompromised population.   

## 2021-06-04 NOTE — Progress Notes (Signed)
[facility] Follow up:    Harlan Stains, MD [address] [place] [address]   DIAGNOSIS:  Cancer Staging  Malignant neoplasm of lower-outer quadrant of right breast of female, estrogen receptor positive ([place]) Staging form: Breast, AJCC 8th Edition - Clinical stage from 02/08/2021: Stage IA (cT1c, cN0, cM0, G2, ER+, PR+, HER2: Equivocal) - Signed by Nicholas Lose, MD on 02/08/2021 Stage prefix: Initial diagnosis Histologic grading system: 3 grade system - Pathologic: Stage IA (pT1c, pN1(sn), cM0, G2, ER+, PR+, HER2-, Oncotype DX score: 28) - Signed by Nicholas Lose, MD on 03/24/2021 Method of lymph node assessment: Sentinel lymph node biopsy Multigene prognostic tests performed: Oncotype DX Recurrence score range: Greater than or equal to 11 Histologic grading system: 3 grade system   SUMMARY OF ONCOLOGIC HISTORY: Oncology History  Malignant neoplasm of lower-outer quadrant of right breast of female, estrogen receptor positive ([place])  01/29/2021 Initial Diagnosis   Screening mammogram detected right breast mass 1.2 cm, axilla normal, biopsy revealed grade 2 IDC with DCIS ER 90%, PR 30%, HER2 2+ by IHC FISH negative   02/08/2021 Cancer Staging   Staging form: Breast, AJCC 8th Edition - Clinical stage from 02/08/2021: Stage IA (cT1c, cN0, cM0, G2, ER+, PR+, HER2: Equivocal) - Signed by Nicholas Lose, MD on 02/08/2021 Stage prefix: Initial diagnosis Histologic grading system: 3 grade system    03/02/2021 Surgery   Right lumpectomy: Grade 2 IDC 1.2 cm with intermediate grade DCIS, focal involvement of anterior medial margin junction, 1/4 lymph nodes positive   03/19/2021 Oncotype testing   Oncotype DX score: 28.  Distant recurrence at 10 years: 17%   03/24/2021 Cancer Staging   Staging form: Breast, AJCC 8th Edition - Pathologic: Stage IA (pT1c, pN1(sn), cM0, G2, ER+, PR+, HER2-, Oncotype DX score: 28) - Signed by Nicholas Lose, MD on 03/24/2021 Method of  lymph node assessment: Sentinel lymph node biopsy Multigene prognostic tests performed: Oncotype DX Recurrence score range: Greater than or equal to 11 Histologic grading system: 3 grade system    04/23/2021 -  Chemotherapy   Patient is on Treatment Plan : BREAST TC q21d        CURRENT THERAPY: Taxotere and Cytoxan  INTERVAL HISTORY: Angelyna Henderson 66 y.o. female returns for follow-up and evaluation prior to receiving Taxotere and Cytoxan chemotherapy.  Today she is due for cycle 3.  Beginning with cycle 2 this has been dose reduced to 85% of the Taxotere and 65% of the Cytoxan.  Sanari is doing well today.  She denies any peripheral neuropathy.  She does say that she has a strange sensation under the right dorsal ball of her foot.  She also has some hyperpigmentation in her nailbeds.  Following chemotherapy she does experience some fatigue and mild nausea.  She tells me that receiving fluids the day of and following chemotherapy have helped.   Patient Active Problem List   Diagnosis Date Noted   Hypothyroidism 06/04/2021   Dyslipidemia 06/04/2021   Moderate recurrent major depression ([place]) 06/04/2021   Vitamin D deficiency 06/04/2021   Other specified disorders of bone density and structure, other site 06/04/2021   Port-A-Cath in place 04/22/2021   Malignant neoplasm of lower-outer quadrant of right breast of female, estrogen receptor positive ([place]) 02/08/2021    is allergic to penicillins.  MEDICAL HISTORY: Past Medical History:  Diagnosis Date   Anxiety    Cancer [facility])    right breast cancer   High cholesterol  History of kidney stones    Hypertension    Hypothyroidism     SURGICAL HISTORY: Past Surgical History:  Procedure Laterality Date   ABDOMINAL HYSTERECTOMY     AXILLARY SENTINEL NODE BIOPSY Right 03/02/2021   Procedure: RIGHT AXILLARY SENTINEL NODE BIOPSY;  Surgeon: Donnie Mesa, MD;  Location: [place];  Service: General;  Laterality: Right;   BREAST  LUMPECTOMY WITH RADIOACTIVE SEED AND SENTINEL LYMPH NODE BIOPSY Right 03/02/2021   Procedure: RIGHT BREAST LUMPECTOMY WITH RADIOACTIVE SEED;  Surgeon: Donnie Mesa, MD;  Location: [place];  Service: General;  Laterality: Right;   KIDNEY SURGERY Right    NECK SURGERY     PORTACATH PLACEMENT N/A 03/31/2021   Procedure: INSERTION PORT-A-CATH;  Surgeon: Donnie Mesa, MD;  Location: [place];  Service: General;  Laterality: N/A;   RE-EXCISION OF BREAST LUMPECTOMY Right 03/31/2021   Procedure: RE-EXCISION OF RIGHT BREAST LUMPECTOMY MARGINS;  Surgeon: Donnie Mesa, MD;  Location: [place];  Service: General;  Laterality: Right;   WISDOM TOOTH EXTRACTION      SOCIAL HISTORY: Social History   Socioeconomic History   Marital status: Married    Spouse name: Not on file   Number of children: Not on file   Years of education: Not on file   Highest education level: Not on file  Occupational History   Not on file  Tobacco Use   Smoking status: Never   Smokeless tobacco: Never  Vaping Use   Vaping Use: Never used  Substance and Sexual Activity   Alcohol use: No   Drug use: No   Sexual activity: Yes    Birth control/protection: Surgical  Other Topics Concern   Not on file  Social History Narrative   Not on file   Social Determinants of Health   Financial Resource Strain: Not on file  Food Insecurity: Not on file  Transportation Needs: Not on file  Physical Activity: Not on file  Stress: Not on file  Social Connections: Not on file  Intimate Partner Violence: Not on file    FAMILY HISTORY: History reviewed. No pertinent family history.  Review of Systems  Constitutional:  Positive for fatigue. Negative for appetite change, chills, fever and unexpected weight change.  HENT:   Negative for hearing loss, lump/mass and trouble swallowing.   Eyes:  Negative for eye problems and icterus.  Respiratory:  Negative for chest tightness, cough and shortness of breath.   Cardiovascular:  Negative  for chest pain, leg swelling and palpitations.  Gastrointestinal:  Negative for abdominal distention, abdominal pain, constipation, diarrhea, nausea and vomiting.  Endocrine: Negative for hot flashes.  Genitourinary:  Negative for difficulty urinating.   Musculoskeletal:  Negative for arthralgias.  Skin:  Negative for itching and rash.  Neurological:  Negative for dizziness, extremity weakness, headaches and numbness.  Hematological:  Negative for adenopathy. Does not bruise/bleed easily.  Psychiatric/Behavioral:  Negative for depression. The patient is not nervous/anxious.      PHYSICAL EXAMINATION  ECOG PERFORMANCE STATUS: 1 - Symptomatic but completely ambulatory  Vitals:   06/04/21 0859  BP: 132/79  Pulse: 76  Resp: 16  Temp: 98.6 F (37 C)  SpO2: 99%    Physical Exam Constitutional:      General: She is not in acute distress.    Appearance: Normal appearance. She is not toxic-appearing.  HENT:     Head: Normocephalic and atraumatic.  Eyes:     General: No scleral icterus. Cardiovascular:     Rate and Rhythm: Normal  rate and regular rhythm.     Pulses: Normal pulses.     Heart sounds: Normal heart sounds.  Pulmonary:     Effort: Pulmonary effort is normal.     Breath sounds: Normal breath sounds.  Abdominal:     General: Abdomen is flat. Bowel sounds are normal. There is no distension.     Palpations: Abdomen is soft.     Tenderness: There is no abdominal tenderness.  Musculoskeletal:        General: No swelling.     Cervical back: Neck supple.  Lymphadenopathy:     Cervical: No cervical adenopathy.  Skin:    General: Skin is warm and dry.     Findings: No rash.     Comments: Darkening at the base of the nailbed noted  Neurological:     General: No focal deficit present.     Mental Status: She is alert.  Psychiatric:        Mood and Affect: Mood normal.        Behavior: Behavior normal.    LABORATORY DATA:  CBC    Component Value Date/Time   WBC  3.3 (L) 06/04/2021 0843   WBC 4.1 03/31/2021 0825   RBC 3.83 (L) 06/04/2021 0843   HGB 10.6 (L) 06/04/2021 0843   HCT 32.7 (L) 06/04/2021 0843   PLT 248 06/04/2021 0843   MCV 85.4 06/04/2021 0843   MCH 27.7 06/04/2021 0843   MCHC 32.4 06/04/2021 0843   RDW 16.0 (H) 06/04/2021 0843   LYMPHSABS 0.8 06/04/2021 0843   MONOABS 0.4 06/04/2021 0843   EOSABS 0.0 06/04/2021 0843   BASOSABS 0.0 06/04/2021 0843    CMP     Component Value Date/Time   NA 139 06/04/2021 0843   K 4.0 06/04/2021 0843   CL 107 06/04/2021 0843   CO2 27 06/04/2021 0843   GLUCOSE 108 (H) 06/04/2021 0843   BUN 11 06/04/2021 0843   CREATININE 0.77 06/04/2021 0843   CALCIUM 9.3 06/04/2021 0843   PROT 6.3 (L) 06/04/2021 0843   ALBUMIN 3.8 06/04/2021 0843   AST 15 06/04/2021 0843   ALT 11 06/04/2021 0843   ALKPHOS 71 06/04/2021 0843   BILITOT 0.3 06/04/2021 0843   GFRNONAA >60 06/04/2021 0843   GFRAA >60 12/08/2014 1533        ASSESSMENT and THERAPY PLAN:   Malignant neoplasm of lower-outer quadrant of right breast of female, estrogen receptor positive ([place]) 01/29/2021: Screening mammogram detected 1.2 cm right breast mass: Grade 2 IDC with DCIS ER 90%, PR 30%, HER2 negative Oncotype score: 28: 17% risk of distant recurrence at 10 years     Treatment plan: 1.  Breast conserving surgery with sentinel lymph node biopsy  03/02/2021: Right lumpectomy: Grade 2 IDC 1.2 cm, intermediate grade DCIS, focal anterior and medial margin positive, 1/4 lymph nodes positive 03/31/2021: Reexcision margins: Benign 2. Adjuvant chemotherapy with Taxotere and Cytoxan every 3 weeks x4 cycles 3.  Adjuvant radiation 4.  Followed by adjuvant antiestrogen therapy --------------------------------------------------------------------------------------------------------------------------------  Current treatment: Cycle 3 Taxotere Cytoxan   Chemotoxicities: Previous dehydration: IV fluids days 1 2 and 3 of treatment Diarrhea: This  has resolved Fatigue: Instead of just taking dexamethasone the day before and the day after chemo she will now take it the day before and for 3 days after chemo Right foot pain: This is mild, intermittent, and not described as neuropathy.  We will monitor this and see how she is doing with her next cycle if it  is worse we may need to consider further dose reduction.     Return to clinic in 1 week for normal saline and in 3 weeks for cycle 3   All questions were answered. The patient knows to call the clinic with any problems, questions or concerns. We can certainly see the patient much sooner if necessary.  Total encounter time:30 minutes*in face-to-face visit time, chart review, lab review, care coordination, order entry, and documentation of the encounter time.  Wilber Bihari, NP 06/04/21 9:29 AM Medical Oncology and Hematology [facility] [place], [place] [address] Tel. [phone]    Fax. [phone]  *Total Encounter Time as defined by the Centers for Medicare and Medicaid Services includes, in addition to the face-to-face time of a patient visit (documented in the note above) non-face-to-face time: obtaining and reviewing outside history, ordering and reviewing medications, tests or procedures, care coordination (communications with other health care professionals or caregivers) and documentation in the medical record.

## 2021-06-04 NOTE — Assessment & Plan Note (Addendum)
01/29/2021: Screening mammogram detected 1.2 cm right breast mass: Grade 2 IDC with DCIS ER 90%, PR 30%, HER2 negative Oncotype score: 28: 17% risk of distant recurrence at 10 years   Treatment plan: 1.Breast conserving surgery with sentinel lymph node biopsy 03/02/2021: Right lumpectomy: Grade 2 IDC 1.2 cm, intermediate grade DCIS, focal anterior and medial margin positive, 1/4 lymph nodes positive 03/31/2021: Reexcision margins: Benign 2.Adjuvant chemotherapy with Taxotere and Cytoxan every 3 weeks x4 cycles 3.Adjuvant radiation 4.Followed by adjuvant antiestrogen therapy -------------------------------------------------------------------------------------------------------------------------------- Current treatment:Cycle 3Taxotere Cytoxan  Chemotoxicities: 1. Previous dehydration: IV fluids days 1 2 and 3 of treatment 2. Diarrhea: This has resolved 3. Fatigue: Instead of just taking dexamethasone the day before and the day after chemo she will now take it the day before and for 3 days after chemo 4. Right foot pain: This is mild, intermittent, and not described as neuropathy.  We will monitor this and see how she is doing with her next cycle if it is worse we may need to consider further dose reduction.    Return to clinic in 1 week for normal saline and in 3 weeks for cycle 3

## 2021-06-04 NOTE — Progress Notes (Signed)
Nutrition Follow-up:  Patient with DCIS of right breast. S/p breast conserving surgery 2/28. She is currently receiving adjuvant chemotherapy with cytoxan/taxotere. Dose reduced starting cycle 2 due to chemotoxicities. Patient is under the care of Dr. Lindi Adie.    Met with patient during infusion. She reports appetite is about the same. Patient with ongoing poor taste for foods. She has not tried baking soda salt water rinses. Patient eating one small meal and is drinking one Ensure daily. She recalls small piece of fish, half baked potato with butter for supper last night.    Medications: reviewed  Labs: glucose 108  Anthropometrics: Weight 176 lb 6.4 oz today decreased 2.8% (5 lbs) in 3 weeks   NUTRITION DIAGNOSIS: Inadequate oral intake continues    INTERVENTION:  Reinforced importance of adequate calorie and protein energy intake to maintain strength/weights Reviewed strategies for poor appetite, encouraged setting "snack time/meal" reminder alarms on phone Continue drinking Ensure Plus/equivalent, recommend 3 daily - coupons, Ensure + CIB sample given Reviewed strategies for altered taste, encouraged pt to try baking soda salt water rinses prior to eating - pt has handout with tips and recipe  Encouraged pt to try shake recipes    MONITORING, EVALUATION, GOAL: weight trends, intake   NEXT VISIT: Monday June 26 during infusion with Desmond Lope

## 2021-06-05 ENCOUNTER — Inpatient Hospital Stay: Payer: Medicare PPO

## 2021-06-05 VITALS — BP 143/71 | HR 84 | Temp 99.0°F | Resp 16

## 2021-06-05 DIAGNOSIS — C50511 Malignant neoplasm of lower-outer quadrant of right female breast: Secondary | ICD-10-CM | POA: Diagnosis not present

## 2021-06-05 DIAGNOSIS — M79671 Pain in right foot: Secondary | ICD-10-CM | POA: Diagnosis not present

## 2021-06-05 DIAGNOSIS — R5383 Other fatigue: Secondary | ICD-10-CM | POA: Diagnosis not present

## 2021-06-05 DIAGNOSIS — R112 Nausea with vomiting, unspecified: Secondary | ICD-10-CM | POA: Diagnosis not present

## 2021-06-05 DIAGNOSIS — E860 Dehydration: Secondary | ICD-10-CM | POA: Diagnosis not present

## 2021-06-05 DIAGNOSIS — Z95828 Presence of other vascular implants and grafts: Secondary | ICD-10-CM

## 2021-06-05 DIAGNOSIS — Z170 Estrogen receptor positive status [ER+]: Secondary | ICD-10-CM | POA: Diagnosis not present

## 2021-06-05 DIAGNOSIS — R109 Unspecified abdominal pain: Secondary | ICD-10-CM | POA: Diagnosis not present

## 2021-06-05 DIAGNOSIS — Z5189 Encounter for other specified aftercare: Secondary | ICD-10-CM | POA: Diagnosis not present

## 2021-06-05 DIAGNOSIS — Z5111 Encounter for antineoplastic chemotherapy: Secondary | ICD-10-CM | POA: Diagnosis not present

## 2021-06-05 MED ORDER — SODIUM CHLORIDE 0.9% FLUSH
10.0000 mL | Freq: Once | INTRAVENOUS | Status: AC
  Administered 2021-06-05: 10 mL via INTRAVENOUS

## 2021-06-05 MED ORDER — SODIUM CHLORIDE 0.9 % IV SOLN: INTRAVENOUS | Status: DC

## 2021-06-05 MED ORDER — HEPARIN SOD (PORK) LOCK FLUSH 100 UNIT/ML IV SOLN
500.0000 [IU] | Freq: Once | INTRAVENOUS | Status: AC
  Administered 2021-06-05: 500 [IU] via INTRAVENOUS

## 2021-06-05 MED ORDER — SODIUM CHLORIDE 0.9 % IV SOLN: Freq: Once | INTRAVENOUS | Status: AC

## 2021-06-07 ENCOUNTER — Other Ambulatory Visit: Payer: Self-pay

## 2021-06-07 ENCOUNTER — Inpatient Hospital Stay: Payer: Medicare PPO

## 2021-06-07 VITALS — BP 116/68 | HR 94 | Temp 98.5°F | Resp 18

## 2021-06-07 DIAGNOSIS — R5383 Other fatigue: Secondary | ICD-10-CM | POA: Diagnosis not present

## 2021-06-07 DIAGNOSIS — E860 Dehydration: Secondary | ICD-10-CM | POA: Diagnosis not present

## 2021-06-07 DIAGNOSIS — R112 Nausea with vomiting, unspecified: Secondary | ICD-10-CM | POA: Diagnosis not present

## 2021-06-07 DIAGNOSIS — Z5111 Encounter for antineoplastic chemotherapy: Secondary | ICD-10-CM | POA: Diagnosis not present

## 2021-06-07 DIAGNOSIS — C50511 Malignant neoplasm of lower-outer quadrant of right female breast: Secondary | ICD-10-CM | POA: Diagnosis not present

## 2021-06-07 DIAGNOSIS — Z170 Estrogen receptor positive status [ER+]: Secondary | ICD-10-CM | POA: Diagnosis not present

## 2021-06-07 DIAGNOSIS — R109 Unspecified abdominal pain: Secondary | ICD-10-CM | POA: Diagnosis not present

## 2021-06-07 DIAGNOSIS — Z5189 Encounter for other specified aftercare: Secondary | ICD-10-CM | POA: Diagnosis not present

## 2021-06-07 DIAGNOSIS — M79671 Pain in right foot: Secondary | ICD-10-CM | POA: Diagnosis not present

## 2021-06-07 MED ORDER — PEGFILGRASTIM-CBQV 6 MG/0.6ML ~~LOC~~ SOSY
6.0000 mg | PREFILLED_SYRINGE | Freq: Once | SUBCUTANEOUS | Status: AC
  Administered 2021-06-07: 6 mg via SUBCUTANEOUS
  Filled 2021-06-07: qty 0.6

## 2021-06-07 MED ORDER — SODIUM CHLORIDE 0.9 % IV SOLN: Freq: Once | INTRAVENOUS | Status: AC

## 2021-06-07 MED ORDER — ONDANSETRON HCL 4 MG/2ML IJ SOLN
8.0000 mg | Freq: Once | INTRAMUSCULAR | Status: AC
  Administered 2021-06-07: 8 mg via INTRAVENOUS
  Filled 2021-06-07: qty 4

## 2021-06-14 ENCOUNTER — Ambulatory Visit: Payer: Medicare PPO

## 2021-06-18 ENCOUNTER — Inpatient Hospital Stay: Payer: Medicare PPO | Admitting: Hematology and Oncology

## 2021-06-18 ENCOUNTER — Inpatient Hospital Stay: Payer: Medicare PPO

## 2021-06-21 ENCOUNTER — Inpatient Hospital Stay: Payer: Medicare PPO

## 2021-06-21 ENCOUNTER — Ambulatory Visit: Payer: Medicare PPO

## 2021-06-24 ENCOUNTER — Encounter: Payer: Self-pay | Admitting: *Deleted

## 2021-06-24 DIAGNOSIS — C50511 Malignant neoplasm of lower-outer quadrant of right female breast: Secondary | ICD-10-CM

## 2021-06-25 ENCOUNTER — Encounter: Payer: Self-pay | Admitting: *Deleted

## 2021-06-25 ENCOUNTER — Inpatient Hospital Stay: Payer: Medicare PPO | Admitting: Hematology and Oncology

## 2021-06-25 ENCOUNTER — Inpatient Hospital Stay: Payer: Medicare PPO

## 2021-06-25 ENCOUNTER — Other Ambulatory Visit: Payer: Self-pay

## 2021-06-25 ENCOUNTER — Inpatient Hospital Stay: Payer: Medicare PPO | Admitting: Physician Assistant

## 2021-06-25 ENCOUNTER — Other Ambulatory Visit: Payer: Self-pay | Admitting: Hematology and Oncology

## 2021-06-25 VITALS — BP 116/76 | HR 76 | Temp 97.8°F | Resp 16 | Ht 63.0 in | Wt 173.3 lb

## 2021-06-25 DIAGNOSIS — Z5111 Encounter for antineoplastic chemotherapy: Secondary | ICD-10-CM | POA: Diagnosis not present

## 2021-06-25 DIAGNOSIS — R109 Unspecified abdominal pain: Secondary | ICD-10-CM | POA: Diagnosis not present

## 2021-06-25 DIAGNOSIS — C50511 Malignant neoplasm of lower-outer quadrant of right female breast: Secondary | ICD-10-CM | POA: Diagnosis not present

## 2021-06-25 DIAGNOSIS — G629 Polyneuropathy, unspecified: Secondary | ICD-10-CM

## 2021-06-25 DIAGNOSIS — Z95828 Presence of other vascular implants and grafts: Secondary | ICD-10-CM

## 2021-06-25 DIAGNOSIS — R112 Nausea with vomiting, unspecified: Secondary | ICD-10-CM | POA: Diagnosis not present

## 2021-06-25 DIAGNOSIS — Z170 Estrogen receptor positive status [ER+]: Secondary | ICD-10-CM

## 2021-06-25 DIAGNOSIS — E860 Dehydration: Secondary | ICD-10-CM | POA: Diagnosis not present

## 2021-06-25 DIAGNOSIS — Z5189 Encounter for other specified aftercare: Secondary | ICD-10-CM | POA: Diagnosis not present

## 2021-06-25 DIAGNOSIS — M79671 Pain in right foot: Secondary | ICD-10-CM | POA: Diagnosis not present

## 2021-06-25 DIAGNOSIS — R5383 Other fatigue: Secondary | ICD-10-CM | POA: Diagnosis not present

## 2021-06-25 LAB — CMP (CANCER CENTER ONLY)
ALT: 9 U/L (ref 0–44)
AST: 13 U/L — ABNORMAL LOW (ref 15–41)
Albumin: 3.8 g/dL (ref 3.5–5.0)
Alkaline Phosphatase: 72 U/L (ref 38–126)
Anion gap: 5 (ref 5–15)
BUN: 12 mg/dL (ref 8–23)
CO2: 28 mmol/L (ref 22–32)
Calcium: 9.3 mg/dL (ref 8.9–10.3)
Chloride: 105 mmol/L (ref 98–111)
Creatinine: 0.87 mg/dL (ref 0.44–1.00)
GFR, Estimated: >60 mL/min (ref >60)
Glucose, Bld: 105 mg/dL — ABNORMAL HIGH (ref 70–99)
Potassium: 4 mmol/L (ref 3.5–5.1)
Sodium: 138 mmol/L (ref 135–145)
Total Bilirubin: 0.3 mg/dL (ref 0.3–1.2)
Total Protein: 6.6 g/dL (ref 6.5–8.1)

## 2021-06-25 LAB — CBC WITH DIFFERENTIAL (CANCER CENTER ONLY)
Abs Immature Granulocytes: 0.02 10*3/uL (ref 0.00–0.07)
Basophils Absolute: 0 10*3/uL (ref 0.0–0.1)
Basophils Relative: 1 %
Eosinophils Absolute: 0 10*3/uL (ref 0.0–0.5)
Eosinophils Relative: 0 %
HCT: 31.8 % — ABNORMAL LOW (ref 36.0–46.0)
Hemoglobin: 10.6 g/dL — ABNORMAL LOW (ref 12.0–15.0)
Immature Granulocytes: 1 %
Lymphocytes Relative: 22 %
Lymphs Abs: 0.7 10*3/uL (ref 0.7–4.0)
MCH: 28.8 pg (ref 26.0–34.0)
MCHC: 33.3 g/dL (ref 30.0–36.0)
MCV: 86.4 fL (ref 80.0–100.0)
Monocytes Absolute: 0.4 10*3/uL (ref 0.1–1.0)
Monocytes Relative: 13 %
Neutro Abs: 2.1 10*3/uL (ref 1.7–7.7)
Neutrophils Relative %: 63 %
Platelet Count: 261 10*3/uL (ref 150–400)
RBC: 3.68 MIL/uL — ABNORMAL LOW (ref 3.87–5.11)
RDW: 16.6 % — ABNORMAL HIGH (ref 11.5–15.5)
WBC Count: 3.3 10*3/uL — ABNORMAL LOW (ref 4.0–10.5)
nRBC: 0 % (ref 0.0–0.2)

## 2021-06-25 MED ORDER — SODIUM CHLORIDE 0.9 % IV SOLN
150.0000 mg | Freq: Once | INTRAVENOUS | Status: AC
  Administered 2021-06-25: 150 mg via INTRAVENOUS
  Filled 2021-06-25: qty 150

## 2021-06-25 MED ORDER — SODIUM CHLORIDE 0.9 % IV SOLN: Freq: Once | INTRAVENOUS | Status: AC

## 2021-06-25 MED ORDER — SODIUM CHLORIDE 0.9 % IV SOLN
50.0000 mg/m2 | Freq: Once | INTRAVENOUS | Status: AC
  Administered 2021-06-25: 100 mg via INTRAVENOUS
  Filled 2021-06-25: qty 10

## 2021-06-25 MED ORDER — SODIUM CHLORIDE 0.9% FLUSH
10.0000 mL | INTRAVENOUS | Status: DC | PRN
  Administered 2021-06-25: 10 mL

## 2021-06-25 MED ORDER — SODIUM CHLORIDE 0.9% FLUSH
10.0000 mL | Freq: Once | INTRAVENOUS | Status: AC
  Administered 2021-06-25: 10 mL

## 2021-06-25 MED ORDER — SODIUM CHLORIDE 0.9 % IV SOLN
10.0000 mg | Freq: Once | INTRAVENOUS | Status: AC
  Administered 2021-06-25: 10 mg via INTRAVENOUS
  Filled 2021-06-25: qty 10

## 2021-06-25 MED ORDER — SODIUM CHLORIDE 0.9 % IV SOLN
400.0000 mg/m2 | Freq: Once | INTRAVENOUS | Status: AC
  Administered 2021-06-25: 780 mg via INTRAVENOUS
  Filled 2021-06-25: qty 39

## 2021-06-25 MED ORDER — PALONOSETRON HCL INJECTION 0.25 MG/5ML
0.2500 mg | Freq: Once | INTRAVENOUS | Status: AC
  Administered 2021-06-25: 0.25 mg via INTRAVENOUS
  Filled 2021-06-25: qty 5

## 2021-06-25 MED ORDER — HEPARIN SOD (PORK) LOCK FLUSH 100 UNIT/ML IV SOLN
500.0000 [IU] | Freq: Once | INTRAVENOUS | Status: AC | PRN
  Administered 2021-06-25: 500 [IU]

## 2021-06-25 NOTE — Progress Notes (Signed)
[facility] Cancer Follow up:    Catherine Montana, MD [phone] [address] [place] [address]   DIAGNOSIS:  Cancer Staging  Malignant neoplasm of lower-outer quadrant of right breast of female, estrogen receptor positive (HCC) Staging form: Breast, AJCC 8th Edition - Clinical stage from 02/08/2021: Stage IA (cT1c, cN0, cM0, G2, ER+, PR+, HER2: Equivocal) - Signed by Catherine Croissant, MD on 02/08/2021 Stage prefix: Initial diagnosis Histologic grading system: 3 grade system - Pathologic: Stage IA (pT1c, pN1(sn), cM0, G2, ER+, PR+, HER2-, Oncotype DX score: 28) - Signed by Catherine Croissant, MD on 03/24/2021 Method of lymph node assessment: Sentinel lymph node biopsy Multigene prognostic tests performed: Oncotype DX Recurrence score range: Greater than or equal to 11 Histologic grading system: 3 grade system   SUMMARY OF ONCOLOGIC HISTORY: Oncology History  Malignant neoplasm of lower-outer quadrant of right breast of female, estrogen receptor positive (HCC)  01/29/2021 Initial Diagnosis   Screening mammogram detected right breast mass 1.2 cm, axilla normal, biopsy revealed grade 2 IDC with DCIS ER 90%, PR 30%, HER2 2+ by IHC FISH negative   02/08/2021 Cancer Staging   Staging form: Breast, AJCC 8th Edition - Clinical stage from 02/08/2021: Stage IA (cT1c, cN0, cM0, G2, ER+, PR+, HER2: Equivocal) - Signed by Catherine Croissant, MD on 02/08/2021 Stage prefix: Initial diagnosis Histologic grading system: 3 grade system   03/02/2021 Surgery   Right lumpectomy: Grade 2 IDC 1.2 cm with intermediate grade DCIS, focal involvement of anterior medial margin junction, 1/4 lymph nodes positive   03/19/2021 Oncotype testing   Oncotype DX score: 28.  Distant recurrence at 10 years: 17%   03/24/2021 Cancer Staging   Staging form: Breast, AJCC 8th Edition - Pathologic: Stage IA (pT1c, pN1(sn), cM0, G2, ER+, PR+, HER2-, Oncotype DX score: 28) - Signed by Catherine Croissant, MD on 03/24/2021 Method of  lymph node assessment: Sentinel lymph node biopsy Multigene prognostic tests performed: Oncotype DX Recurrence score range: Greater than or equal to 11 Histologic grading system: 3 grade system   04/23/2021 -  Chemotherapy   Patient is on Treatment Plan : BREAST TC q21d       CURRENT THERAPY: Taxotere and Cytoxan  INTERVAL HISTORY: Catherine [name] 65 y.o. female returns for follow-up and evaluation prior to receiving Taxotere and Cytoxan chemotherapy.  Today she is due for cycle 4.    Catherine [name] reports that her energy levels are fairly stable. She has ongoing fatigue but is able to complete her ADLs on her own. She continues to have decreased appetite and has lost 3 lbs since 06/04/2021. She denies any nausea, vomiting or abdominal pain. Her bowel habits are unchanged without any recurrent episodes of diarrhea or constipation. She reports persistent neuropathy in her fingers and toes. She adds that she does have some difficulty with grip but denies any interference with balance. She denies fevers, chills, shortness of breath, chest pain, cough or peripheral edema. She has no other complaints.    Patient Active Problem List   Diagnosis Date Noted   Hypothyroidism 06/04/2021   Dyslipidemia 06/04/2021   Moderate recurrent major depression (HCC) 06/04/2021   Vitamin D deficiency 06/04/2021   Other specified disorders of bone density and structure, other site 06/04/2021   Port-A-Cath in place 04/22/2021   Malignant neoplasm of lower-outer quadrant of right breast of female, estrogen receptor positive (HCC) 02/08/2021    is allergic to penicillins.  MEDICAL HISTORY: Past Medical History:  Diagnosis Date   Anxiety  Cancer (HCC)    right breast cancer   High cholesterol    History of kidney stones    Hypertension    Hypothyroidism     SURGICAL HISTORY: Past Surgical History:  Procedure Laterality Date   ABDOMINAL HYSTERECTOMY     AXILLARY SENTINEL NODE BIOPSY Right 03/02/2021    Procedure: RIGHT AXILLARY SENTINEL NODE BIOPSY;  Surgeon: Manus Rudd, MD;  Location: [facility] OR;  Service: General;  Laterality: Right;   BREAST LUMPECTOMY WITH RADIOACTIVE SEED AND SENTINEL LYMPH NODE BIOPSY Right 03/02/2021   Procedure: RIGHT BREAST LUMPECTOMY WITH RADIOACTIVE SEED;  Surgeon: Manus Rudd, MD;  Location: [facility] OR;  Service: General;  Laterality: Right;   KIDNEY SURGERY Right    NECK SURGERY     PORTACATH PLACEMENT N/A 03/31/2021   Procedure: INSERTION PORT-A-CATH;  Surgeon: Manus Rudd, MD;  Location: [facility] OR;  Service: General;  Laterality: N/A;   RE-EXCISION OF BREAST LUMPECTOMY Right 03/31/2021   Procedure: RE-EXCISION OF RIGHT BREAST LUMPECTOMY MARGINS;  Surgeon: Manus Rudd, MD;  Location: [facility] OR;  Service: General;  Laterality: Right;   WISDOM TOOTH EXTRACTION      SOCIAL HISTORY: Social History   Socioeconomic History   Marital status: Married    Spouse name: Not on file   Number of children: Not on file   Years of education: Not on file   Highest education level: Not on file  Occupational History   Not on file  Tobacco Use   Smoking status: Never   Smokeless tobacco: Never  Vaping Use   Vaping Use: Never used  Substance and Sexual Activity   Alcohol use: No   Drug use: No   Sexual activity: Yes    Birth control/protection: Surgical  Other Topics Concern   Not on file  Social History Narrative   Not on file   Social Determinants of Health   Financial Resource Strain: Not on file  Food Insecurity: Not on file  Transportation Needs: Not on file  Physical Activity: Not on file  Stress: Not on file  Social Connections: Not on file  Intimate Partner Violence: Not on file    FAMILY HISTORY: No family history on file.  Review of Systems  Constitutional:  Positive for fatigue. Negative for appetite change, chills, fever and unexpected weight change.  HENT:   Negative for hearing loss, lump/mass and trouble swallowing.   Eyes:  Negative for eye  problems and icterus.  Respiratory:  Negative for chest tightness, cough and shortness of breath.   Cardiovascular:  Negative for chest pain, leg swelling and palpitations.  Gastrointestinal:  Negative for abdominal distention, abdominal pain, constipation, diarrhea, nausea and vomiting.  Endocrine: Negative for hot flashes.  Genitourinary:  Negative for difficulty urinating.   Musculoskeletal:  Negative for arthralgias.  Skin:  Negative for itching and rash.  Neurological:  Positive for numbness. Negative for dizziness, extremity weakness and headaches.  Hematological:  Negative for adenopathy. Does not bruise/bleed easily.  Psychiatric/Behavioral:  Negative for depression. The patient is not nervous/anxious.       PHYSICAL EXAMINATION  ECOG PERFORMANCE STATUS: 1 - Symptomatic but completely ambulatory  Vitals:   06/25/21 1015  BP: 116/76  Pulse: 76  Resp: 16  Temp: 97.8 F (36.6 C)  SpO2: 98%    Physical Exam Constitutional:      General: She is not in acute distress.    Appearance: Normal appearance. She is not toxic-appearing.  HENT:     Head: Normocephalic and atraumatic.  Eyes:  General: No scleral icterus. Cardiovascular:     Rate and Rhythm: Normal rate and regular rhythm.     Pulses: Normal pulses.     Heart sounds: Normal heart sounds.  Pulmonary:     Effort: Pulmonary effort is normal.     Breath sounds: Normal breath sounds.  Abdominal:     General: Abdomen is flat. Bowel sounds are normal. There is no distension.     Palpations: Abdomen is soft.     Tenderness: There is no abdominal tenderness.  Musculoskeletal:        General: No swelling.     Cervical back: Neck supple.  Lymphadenopathy:     Cervical: No cervical adenopathy.  Skin:    General: Skin is warm and dry.     Findings: No rash.     Comments: Darkening at the base of the nailbed noted  Neurological:     General: No focal deficit present.     Mental Status: She is alert.   Psychiatric:        Mood and Affect: Mood normal.        Behavior: Behavior normal.     LABORATORY DATA:  CBC    Component Value Date/Time   WBC 3.3 (L) 06/25/2021 0946   WBC 4.1 03/31/2021 0825   RBC 3.68 (L) 06/25/2021 0946   HGB 10.6 (L) 06/25/2021 0946   HCT 31.8 (L) 06/25/2021 0946   PLT 261 06/25/2021 0946   MCV 86.4 06/25/2021 0946   MCH 28.8 06/25/2021 0946   MCHC 33.3 06/25/2021 0946   RDW 16.6 (H) 06/25/2021 0946   LYMPHSABS 0.7 06/25/2021 0946   MONOABS 0.4 06/25/2021 0946   EOSABS 0.0 06/25/2021 0946   BASOSABS 0.0 06/25/2021 0946    CMP     Component Value Date/Time   NA 138 06/25/2021 0946   K 4.0 06/25/2021 0946   CL 105 06/25/2021 0946   CO2 28 06/25/2021 0946   GLUCOSE 105 (H) 06/25/2021 0946   BUN 12 06/25/2021 0946   CREATININE 0.87 06/25/2021 0946   CALCIUM 9.3 06/25/2021 0946   PROT 6.6 06/25/2021 0946   ALBUMIN 3.8 06/25/2021 0946   AST 13 (L) 06/25/2021 0946   ALT 9 06/25/2021 0946   ALKPHOS 72 06/25/2021 0946   BILITOT 0.3 06/25/2021 0946   GFRNONAA >60 06/25/2021 0946   GFRAA >60 12/08/2014 1533        ASSESSMENT and THERAPY PLAN:   Malignant neoplasm of lower-outer quadrant of right breast of female, estrogen receptor positive (HCC) 01/29/2021: Screening mammogram detected 1.2 cm right breast mass: Grade 2 IDC with DCIS ER 90%, PR 30%, HER2 negative Oncotype score: 28: 17% risk of distant recurrence at 10 years   Treatment plan: 1.  Breast conserving surgery with sentinel lymph node biopsy  03/02/2021: Right lumpectomy: Grade 2 IDC 1.2 cm, intermediate grade DCIS, focal anterior and medial margin positive, 1/4 lymph nodes positive 03/31/2021: Reexcision margins: Benign 2. Adjuvant chemotherapy with Taxotere and Cytoxan every 3 weeks x4 cycles 3.  Adjuvant radiation 4.  Followed by adjuvant antiestrogen  therapy --------------------------------------------------------------------------------------------------------------------------------  Current treatment: Cycle 4 Taxotere Cytoxan   Chemotoxicities: Previous dehydration: IV fluids days 1 2 and 3 of treatment Diarrhea: Resolved Fatigue: Improved. Currently takes dexamethasone day before and for 3 days after chemo Neuropathy involving fingers/toes: Grade 2, discussed with Dr. Al Pimple covering for Dr. Pamelia Hoit. Recommend dose reduction of Taxotere from 65 mg/m2 to 50 mg/m2. Discussed medications including gabapentin/lyrica. Patient refused and would like to  avoid taking additional medications. Discussed that neuropathy can linger after completion of chemotherapy today. Advised to monitor closely and follow up with clinic if neuropathy worsens or if she decides to try neuropathy medication.    Follow up: Patient is scheduled for consultation with radiation oncology on 07/01/2021 to discuss adjuvant radiation. Patient will follow up with Dr. Pamelia Hoit during last week of radiation. Navigators will coordinate visit once radiation schedule is finalized.   All questions were answered. The patient knows to call the clinic with any problems, questions or concerns. We can certainly see the patient much sooner if necessary.  I have spent a total of  minutes of face-to-face and non-face-30 minutesto-face time, preparing to see the patient, performing a medically appropriate examination, counseling and educating the patient, documenting clinical information in the electronic health record,  and care coordination.   Georga Kaufmann PA-C Dept of Hematology and Oncology [facility] at [facility] Phone: [phone]

## 2021-06-26 ENCOUNTER — Inpatient Hospital Stay: Payer: Medicare PPO

## 2021-06-28 ENCOUNTER — Inpatient Hospital Stay (HOSPITAL_BASED_OUTPATIENT_CLINIC_OR_DEPARTMENT_OTHER): Payer: Medicare PPO | Admitting: Physician Assistant

## 2021-06-28 ENCOUNTER — Inpatient Hospital Stay: Payer: Medicare PPO

## 2021-06-28 ENCOUNTER — Encounter: Payer: Self-pay | Admitting: Hematology and Oncology

## 2021-06-28 ENCOUNTER — Other Ambulatory Visit: Payer: Self-pay

## 2021-06-28 VITALS — BP 133/80 | HR 82 | Temp 99.1°F | Resp 16

## 2021-06-28 VITALS — BP 143/114 | HR 88 | Temp 98.5°F | Resp 16

## 2021-06-28 DIAGNOSIS — C50511 Malignant neoplasm of lower-outer quadrant of right female breast: Secondary | ICD-10-CM

## 2021-06-28 DIAGNOSIS — R112 Nausea with vomiting, unspecified: Secondary | ICD-10-CM

## 2021-06-28 DIAGNOSIS — Z95828 Presence of other vascular implants and grafts: Secondary | ICD-10-CM

## 2021-06-28 DIAGNOSIS — Z170 Estrogen receptor positive status [ER+]: Secondary | ICD-10-CM | POA: Diagnosis not present

## 2021-06-28 DIAGNOSIS — R109 Unspecified abdominal pain: Secondary | ICD-10-CM | POA: Diagnosis not present

## 2021-06-28 DIAGNOSIS — Z5111 Encounter for antineoplastic chemotherapy: Secondary | ICD-10-CM | POA: Diagnosis not present

## 2021-06-28 DIAGNOSIS — M79671 Pain in right foot: Secondary | ICD-10-CM | POA: Diagnosis not present

## 2021-06-28 DIAGNOSIS — E860 Dehydration: Secondary | ICD-10-CM | POA: Diagnosis not present

## 2021-06-28 DIAGNOSIS — Z5189 Encounter for other specified aftercare: Secondary | ICD-10-CM | POA: Diagnosis not present

## 2021-06-28 DIAGNOSIS — R5383 Other fatigue: Secondary | ICD-10-CM | POA: Diagnosis not present

## 2021-06-28 MED ORDER — HEPARIN SOD (PORK) LOCK FLUSH 100 UNIT/ML IV SOLN
500.0000 [IU] | Freq: Once | INTRAVENOUS | Status: AC
  Administered 2021-06-28: 500 [IU]

## 2021-06-28 MED ORDER — PEGFILGRASTIM-CBQV 6 MG/0.6ML ~~LOC~~ SOSY
6.0000 mg | PREFILLED_SYRINGE | Freq: Once | SUBCUTANEOUS | Status: AC
  Administered 2021-06-28: 6 mg via SUBCUTANEOUS
  Filled 2021-06-28: qty 0.6

## 2021-06-28 MED ORDER — SODIUM CHLORIDE 0.9 % IV SOLN: Freq: Once | INTRAVENOUS | Status: AC

## 2021-06-28 MED ORDER — MORPHINE SULFATE (PF) 2 MG/ML IV SOLN
2.0000 mg | Freq: Once | INTRAVENOUS | Status: AC
  Administered 2021-06-28: 2 mg via INTRAVENOUS
  Filled 2021-06-28: qty 1

## 2021-06-28 MED ORDER — SODIUM CHLORIDE 0.9% FLUSH
10.0000 mL | Freq: Once | INTRAVENOUS | Status: AC
  Administered 2021-06-28: 10 mL

## 2021-06-28 MED ORDER — ONDANSETRON HCL 4 MG/2ML IJ SOLN
8.0000 mg | Freq: Once | INTRAMUSCULAR | Status: AC
  Administered 2021-06-28: 8 mg via INTRAVENOUS
  Filled 2021-06-28: qty 4

## 2021-06-28 NOTE — Progress Notes (Signed)
Radiation Oncology         [phone] ________________________________  Name: Catherine [name]        MRN: [id number]  Date of Service: 07/01/2021 DOB: [birth year]  CC:Harlan Stains, MD  Nicholas Lose, MD     REFERRING PHYSICIAN: Nicholas Lose, MD   DIAGNOSIS: The encounter diagnosis was Malignant neoplasm of lower-outer quadrant of right breast of female, estrogen receptor positive ([place]).   HISTORY OF PRESENT ILLNESS: Catherine [name] is a 66 y.o. female seen at the request of Dr. Quintella Reichert for a diagnosis of right breast cancer.  The patient is followed by general surgery for what is felt to be a possible benign splenic mass.  More recently however she was found to have screening mammogram at [place] a mass in the right breast in the lower outer quadrant.  By ultrasound this measured 12 mm in the 8 o'clock position, and no evidence of adenopathy in the right axilla.  A biopsy showed grade 2 invasive ductal carcinoma with associated DCIS, her cancer was ER/PR positive, HER2 was negative.  Oncotype Dx score was performed on her biopsy and showed 28.  She then underwent right lumpectomy on 03/02/2021 showing grade 2 invasive ductal carcinoma measuring 1.2 cm with associated intermediate grade DCIS focally involving the anterior and medial margins and less than 1 mm from the lateral superior and inferior margins.  She had 4 sampled lymph nodes removed, 1 of which contained metastatic disease.  She underwent reexcision of her margins on 03/31/2021 and no persistent carcinoma was identified.  With her Oncotype DX score, she was counseled on adjuvant chemotherapy which she began on 04/23/2021.  She completed this on 06/25/2021, and is seen to discuss adjuvant radiotherapy to the right breast.  PREVIOUS RADIATION THERAPY: No   PAST MEDICAL HISTORY:  Past Medical History:  Diagnosis Date   Anxiety    Cancer ([place])    right breast cancer   High cholesterol    History of kidney stones     Hypertension    Hypothyroidism        PAST SURGICAL HISTORY: Past Surgical History:  Procedure Laterality Date   ABDOMINAL HYSTERECTOMY     AXILLARY SENTINEL NODE BIOPSY Right 03/02/2021   Procedure: RIGHT AXILLARY SENTINEL NODE BIOPSY;  Surgeon: Donnie Mesa, MD;  Location: [place];  Service: General;  Laterality: Right;   BREAST LUMPECTOMY WITH RADIOACTIVE SEED AND SENTINEL LYMPH NODE BIOPSY Right 03/02/2021   Procedure: RIGHT BREAST LUMPECTOMY WITH RADIOACTIVE SEED;  Surgeon: Donnie Mesa, MD;  Location: [place];  Service: General;  Laterality: Right;   KIDNEY SURGERY Right    NECK SURGERY     PORTACATH PLACEMENT N/A 03/31/2021   Procedure: INSERTION PORT-A-CATH;  Surgeon: Donnie Mesa, MD;  Location: [place];  Service: General;  Laterality: N/A;   RE-EXCISION OF BREAST LUMPECTOMY Right 03/31/2021   Procedure: RE-EXCISION OF RIGHT BREAST LUMPECTOMY MARGINS;  Surgeon: Donnie Mesa, MD;  Location: [place];  Service: General;  Laterality: Right;   WISDOM TOOTH EXTRACTION       FAMILY HISTORY: No family history on file.   SOCIAL HISTORY:  reports that she has never smoked. She has never used smokeless tobacco. She reports that she does not drink alcohol and does not use drugs.  The patient is married and resides in [place].  She was very active in her church prior to her cancer diagnosis and hopes to get back to this soon.   ALLERGIES: Penicillins   MEDICATIONS:  Current  Outpatient Medications  Medication Sig Dispense Refill   acetaminophen (TYLENOL) 500 MG tablet Take 1,000 mg by mouth every 6 (six) hours as needed for moderate pain.     Calcium Magnesium Zinc 333-133-5 MG TABS Take 1 tablet by mouth daily. (Patient taking differently: Take 1 tablet by mouth once a week.)     dexamethasone (DECADRON) 4 MG tablet Take 1 tablet (4 mg total) by mouth daily. Take 1 tablet day before chemo and 1 tablet x 3 days after chemo with food 8 tablet 0   diphenhydrAMINE (BENADRYL) 25 MG tablet  Take 25 mg by mouth every 6 (six) hours as needed for allergies.     diphenoxylate-atropine (LOMOTIL) 2.5-0.025 MG tablet Take 1 tablet by mouth 4 (four) times daily as needed for diarrhea or loose stools. 60 tablet 1   ergocalciferol (VITAMIN D2) 1.25 MG (50000 UT) capsule Take 1 capsule (50,000 Units total) by mouth once a week.     escitalopram (LEXAPRO) 5 MG tablet Take 5 mg by mouth once a week.     levothyroxine (SYNTHROID) 50 MCG tablet Take 1 tablet (50 mcg total) by mouth daily before breakfast.     lidocaine-prilocaine (EMLA) cream Apply to affected area once 30 g 3   metoprolol tartrate (LOPRESSOR) 25 MG tablet Take 12.5 mg by mouth 2 (two) times daily.     ondansetron (ZOFRAN) 8 MG tablet Take 1 tablet (8 mg total) by mouth 2 (two) times daily as needed for refractory nausea / vomiting. Start on day 3 after chemo. 30 tablet 1   prochlorperazine (COMPAZINE) 10 MG tablet TAKE 1 TABLET(10 MG) BY MOUTH EVERY 6 HOURS AS NEEDED FOR NAUSEA OR VOMITING 30 tablet 1   rosuvastatin (CRESTOR) 5 MG tablet Take 1 tablet (5 mg total) by mouth daily.     Current Facility-Administered Medications  Medication Dose Route Frequency Provider Last Rate Last Admin   ondansetron (ZOFRAN) injection 8 mg  8 mg Intravenous Once Nicholas Lose, MD       Facility-Administered Medications Ordered in Other Visits  Medication Dose Route Frequency Provider Last Rate Last Admin   ondansetron (ZOFRAN) 4 MG/2ML injection            ondansetron (ZOFRAN) 4 MG/2ML injection              REVIEW OF SYSTEMS: On review of systems, the patient reports that she is doing poorly with oral intake, concentrated urine, and has lost about 15 pounds in the last 3-4 weeks. She reports nausea has been terrible in the last few days despite compazine and zofran every 6-8 hours. She feels dizzy and has had a few headaches but no loss of function of her extremities, vision or movement. No other complaints are verbalized.      PHYSICAL  EXAM:  Wt Readings from Last 3 Encounters:  06/25/21 173 lb 4.8 oz (78.6 kg)  06/04/21 176 lb 6.4 oz (80 kg)  05/14/21 181 lb 4.8 oz (82.2 kg)   Temp Readings from Last 3 Encounters:  06/25/21 97.8 F (36.6 C) (Tympanic)  06/07/21 98.5 F (36.9 C) (Oral)  06/05/21 99 F (37.2 C) (Oral)   BP Readings from Last 3 Encounters:  06/25/21 116/76  06/07/21 116/68  06/05/21 (!) 143/71   Pulse Readings from Last 3 Encounters:  06/25/21 76  06/07/21 94  06/05/21 84    In general this is a tired, uncomfortable appearing African-American female in no acute distress. She's alert and oriented x4 and appropriate  throughout the examination. Cardiopulmonary assessment is negative for acute distress and she exhibits normal effort. Bilateral breast exam is deferred.    ECOG = 2  0 - Asymptomatic (Fully active, able to carry on all predisease activities without restriction)  1 - Symptomatic but completely ambulatory (Restricted in physically strenuous activity but ambulatory and able to carry out work of a light or sedentary nature. For example, light housework, office work)  2 - Symptomatic, <50% in bed during the day (Ambulatory and capable of all self care but unable to carry out any work activities. Up and about more than 50% of waking hours)  3 - Symptomatic, >50% in bed, but not bedbound (Capable of only limited self-care, confined to bed or chair 50% or more of waking hours)  4 - Bedbound (Completely disabled. Cannot carry on any self-care. Totally confined to bed or chair)  5 - Death   Eustace Pen MM, Creech RH, Tormey DC, et al. [phone]). "Toxicity and response criteria of the [facility]". [place] Oncol. 5 (6): 649-55    LABORATORY DATA:  Lab Results  Component Value Date   WBC 3.3 (L) 06/25/2021   HGB 10.6 (L) 06/25/2021   HCT 31.8 (L) 06/25/2021   MCV 86.4 06/25/2021   PLT 261 06/25/2021   Lab Results  Component Value Date   NA 138 06/25/2021   K  4.0 06/25/2021   CL 105 06/25/2021   CO2 28 06/25/2021   Lab Results  Component Value Date   ALT 9 06/25/2021   AST 13 (L) 06/25/2021   ALKPHOS 72 06/25/2021   BILITOT 0.3 06/25/2021      RADIOGRAPHY: No results found.     IMPRESSION/PLAN: 1. Stage IA, [id number], grade 2, ER/PR positive invasive ductal carcinoma of the right breast. Dr. Lisbeth Renshaw discusses the final pathology findings and reviews the nature of node positive breast disease.  She has done well since surgery and is now completed her systemic chemotherapy.  Dr. Lisbeth Renshaw recommends external radiotherapy to the breast  to reduce risks of local recurrence followed by antiestrogen therapy. We discussed the risks, benefits, short, and long term effects of radiotherapy, as well as the curative intent, and the patient is interested in proceeding. Dr. Lisbeth Renshaw discusses the delivery and logistics of radiotherapy and recommends 6 1/2 weeks of radiotherapy to the right breast and regional lymph nodes. Written consent is obtained and placed in the chart, a copy was provided to the patient.  She will be contacted for simulation and to begin radiotherapy approximately 4 weeks from now. 2. Chemotherapy induced nausea, and hypotension. We discussed the use of ativan in addition to her antiemetic regimen. I sent in a prescription after we gave this to her today and this did help. She will notify our team if her symptoms progress, but I encouraged her to check with her PCP regarding antihypertensives since she's lost weight and has such a low pressure despite just one dose of metroprolol. She is in agreement and can check her BP at home and report results to PCP. We have coordinated with medical oncology and she will get IVF upstairs this afternoon.   In a visit lasting 60 minutes, greater than 50% of the time was spent face to face reviewing her case, as well as in preparation of, discussing, and coordinating the patient's care.  The above documentation  reflects my direct findings during this shared patient visit. Please see the separate note by Dr. Lisbeth Renshaw on this date for  the remainder of the patient's plan of care.    Carola Rhine, [facility]    **Disclaimer: This note was dictated with voice recognition software. Similar sounding words can inadvertently be transcribed and this note may contain transcription errors which may not have been corrected upon publication of note.**

## 2021-06-30 NOTE — Progress Notes (Signed)
New Breast Cancer Diagnosis: Right Breast LOQ  Did patient present with symptoms (if so, please note symptoms) or screening mammography?:Screening Mass     Location and Extent of disease :right breast. Located at 8 o'clock position, measured 1.2 cm in greatest dimension. Adenopathy no.  Re-excision of Margins 03/31/2021   Histology per Pathology Report: grade 2, Invasive Ductal Carcinoma with associated DCIS.  Oncotype Dx score 28 03/02/2021  Receptor Status: ER(positive), PR (positive), Her2-neu (negative), Ki-(%)   Surgeon and surgical plan, if any:  Dr. Georgette Dover -Right Breast Lumpectomy with radioactive seed 03/02/2021 -Re-excision of Right Breast Lumpectomy margins 03/31/2021   Medical oncologist, treatment if any:   Dr. Lindi Adie -Adjuvant Chemotherapy 04/23/2021- 06/25/2021   Family History of Breast/Ovarian/Prostate Cancer: No  Lymphedema issues, if any: No     Pain issues, if any: No    SAFETY ISSUES: Prior radiation? No Pacemaker/ICD? No Possible current pregnancy? Postmenopausal Is the patient on methotrexate? No  Current Complaints / other details:

## 2021-07-01 ENCOUNTER — Telehealth: Payer: Self-pay

## 2021-07-01 ENCOUNTER — Other Ambulatory Visit: Payer: Self-pay

## 2021-07-01 ENCOUNTER — Encounter: Payer: Self-pay | Admitting: Radiation Oncology

## 2021-07-01 ENCOUNTER — Inpatient Hospital Stay: Payer: Medicare PPO

## 2021-07-01 ENCOUNTER — Ambulatory Visit
Admission: RE | Admit: 2021-07-01 | Discharge: 2021-07-01 | Disposition: A | Discharge status: Home / Self Care | Payer: Medicare PPO | Source: Ambulatory Visit | Attending: Radiation Oncology | Admitting: Radiation Oncology

## 2021-07-01 VITALS — BP 85/55 | HR 96 | Temp 98.9°F | Resp 18 | Ht 63.0 in | Wt 167.4 lb

## 2021-07-01 VITALS — BP 116/67

## 2021-07-01 DIAGNOSIS — Z7952 Long term (current) use of systemic steroids: Secondary | ICD-10-CM | POA: Insufficient documentation

## 2021-07-01 DIAGNOSIS — Z5189 Encounter for other specified aftercare: Secondary | ICD-10-CM | POA: Diagnosis not present

## 2021-07-01 DIAGNOSIS — I959 Hypotension, unspecified: Secondary | ICD-10-CM | POA: Diagnosis not present

## 2021-07-01 DIAGNOSIS — R110 Nausea: Secondary | ICD-10-CM | POA: Diagnosis not present

## 2021-07-01 DIAGNOSIS — Z87442 Personal history of urinary calculi: Secondary | ICD-10-CM | POA: Diagnosis not present

## 2021-07-01 DIAGNOSIS — I10 Essential (primary) hypertension: Secondary | ICD-10-CM | POA: Diagnosis not present

## 2021-07-01 DIAGNOSIS — E860 Dehydration: Secondary | ICD-10-CM | POA: Diagnosis not present

## 2021-07-01 DIAGNOSIS — E039 Hypothyroidism, unspecified: Secondary | ICD-10-CM | POA: Insufficient documentation

## 2021-07-01 DIAGNOSIS — T451X5A Adverse effect of antineoplastic and immunosuppressive drugs, initial encounter: Secondary | ICD-10-CM | POA: Diagnosis not present

## 2021-07-01 DIAGNOSIS — C50511 Malignant neoplasm of lower-outer quadrant of right female breast: Secondary | ICD-10-CM

## 2021-07-01 DIAGNOSIS — Z5111 Encounter for antineoplastic chemotherapy: Secondary | ICD-10-CM | POA: Diagnosis not present

## 2021-07-01 DIAGNOSIS — M79671 Pain in right foot: Secondary | ICD-10-CM | POA: Diagnosis not present

## 2021-07-01 DIAGNOSIS — R5383 Other fatigue: Secondary | ICD-10-CM | POA: Diagnosis not present

## 2021-07-01 DIAGNOSIS — Z170 Estrogen receptor positive status [ER+]: Secondary | ICD-10-CM | POA: Insufficient documentation

## 2021-07-01 DIAGNOSIS — Z79890 Hormone replacement therapy (postmenopausal): Secondary | ICD-10-CM | POA: Diagnosis not present

## 2021-07-01 DIAGNOSIS — Z95828 Presence of other vascular implants and grafts: Secondary | ICD-10-CM

## 2021-07-01 DIAGNOSIS — E7800 Pure hypercholesterolemia, unspecified: Secondary | ICD-10-CM | POA: Diagnosis not present

## 2021-07-01 DIAGNOSIS — R112 Nausea with vomiting, unspecified: Secondary | ICD-10-CM | POA: Diagnosis not present

## 2021-07-01 DIAGNOSIS — R109 Unspecified abdominal pain: Secondary | ICD-10-CM | POA: Diagnosis not present

## 2021-07-01 DIAGNOSIS — R634 Abnormal weight loss: Secondary | ICD-10-CM | POA: Insufficient documentation

## 2021-07-01 DIAGNOSIS — Z79899 Other long term (current) drug therapy: Secondary | ICD-10-CM | POA: Insufficient documentation

## 2021-07-01 DIAGNOSIS — R630 Anorexia: Secondary | ICD-10-CM | POA: Insufficient documentation

## 2021-07-01 MED ORDER — HEPARIN SOD (PORK) LOCK FLUSH 100 UNIT/ML IV SOLN
500.0000 [IU] | Freq: Once | INTRAVENOUS | Status: AC
  Administered 2021-07-01: 500 [IU]

## 2021-07-01 MED ORDER — LORAZEPAM 0.5 MG PO TABS: 0.5000 mg | ORAL_TABLET | Freq: Four times a day (QID) | ORAL | 0 refills | Status: DC | PRN

## 2021-07-01 MED ORDER — LORAZEPAM 1 MG PO TABS
0.5000 mg | ORAL_TABLET | Freq: Once | ORAL | Status: AC
  Administered 2021-07-01: 0.5 mg via ORAL
  Filled 2021-07-01: qty 0.5

## 2021-07-01 MED ORDER — SODIUM CHLORIDE 0.9 % IV SOLN: 8.0000 mg | Freq: Once | INTRAVENOUS | Status: DC

## 2021-07-01 MED ORDER — SODIUM CHLORIDE 0.9% FLUSH
10.0000 mL | Freq: Once | INTRAVENOUS | Status: AC
  Administered 2021-07-01: 10 mL

## 2021-07-01 MED ORDER — ONDANSETRON HCL 4 MG/2ML IJ SOLN: 8.0000 mg | Freq: Once | INTRAMUSCULAR | Status: DC

## 2021-07-01 MED ORDER — SODIUM CHLORIDE 0.9 % IV SOLN: Freq: Once | INTRAVENOUS | Status: AC

## 2021-07-01 NOTE — Patient Instructions (Signed)
Rehydration, Adult Rehydration is the replacement of body fluids, salts, and minerals (electrolytes) that are lost during dehydration. Dehydration is when there is not enough water or other fluids in the body. This happens when you lose more fluids than you take in. Common causes of dehydration include: Not drinking enough fluids. This can occur when you are ill or doing activities that require a lot of energy, especially in hot weather. Conditions that cause loss of water or other fluids, such as diarrhea, vomiting, sweating, or urinating a lot. Other illnesses, such as fever or infection. Certain medicines, such as those that remove excess fluid from the body (diuretics). Symptoms of mild or moderate dehydration may include thirst, dry lips and mouth, and dizziness. Symptoms of severe dehydration may include increased heart rate, confusion, fainting, and not urinating. For severe dehydration, you may need to get fluids through an IV at the hospital. For mild or moderate dehydration, you can usually rehydrate at home by drinking certain fluids as told by your health care provider. What are the risks? Generally, rehydration is safe. However, taking in too much fluid (overhydration) can be a problem. This is rare. Overhydration can cause an electrolyte imbalance, kidney failure, or a decrease in salt (sodium) levels in the body. Supplies needed You will need an oral rehydration solution (ORS) if your health care provider tells you to use one. This is a drink to treat dehydration. It can be found in pharmacies and retail stores. How to rehydrate Fluids Follow instructions from your health care provider for rehydration. The kind of fluid and the amount you should drink depend on your condition. In general, you should choose drinks that you prefer. If told by your health care provider, drink an ORS. Make an ORS by following instructions on the package. Start by drinking small amounts, about  cup (120  mL) every 5-10 minutes. Slowly increase how much you drink until you have taken the amount recommended by your health care provider. Drink enough clear fluids to keep your urine pale yellow. If you were told to drink an ORS, finish it first, then start slowly drinking other clear fluids. Drink fluids such as: Water. This includes sparkling water and flavored water. Drinking only water can lead to having too little sodium in your body (hyponatremia). Follow the advice of your health care provider. Water from ice chips you suck on. Fruit juice with water you add to it (diluted). Sports drinks. Hot or cold herbal teas. Broth-based soups. Milk or milk products. Food Follow instructions from your health care provider about what to eat while you rehydrate. Your health care provider may recommend that you slowly begin eating regular foods in small amounts. Eat foods that contain a healthy balance of electrolytes, such as bananas, oranges, potatoes, tomatoes, and spinach. Avoid foods that are greasy or contain a lot of sugar. In some cases, you may get nutrition through a feeding tube that is passed through your nose and into your stomach (nasogastric tube, or NG tube). This may be done if you have uncontrolled vomiting or diarrhea. Beverages to avoid  Certain beverages may make dehydration worse. While you rehydrate, avoid drinking alcohol. How to tell if you are recovering from dehydration You may be recovering from dehydration if: You are urinating more often than before you started rehydrating. Your urine is pale yellow. Your energy level improves. You vomit less frequently. You have diarrhea less frequently. Your appetite improves or returns to normal. You feel less dizzy or less light-headed.   Your skin tone and color start to look more normal. Follow these instructions at home: Take over-the-counter and prescription medicines only as told by your health care provider. Do not take sodium  tablets. Doing this can lead to having too much sodium in your body (hypernatremia). Contact a health care provider if: You continue to have symptoms of mild or moderate dehydration, such as: Thirst. Dry lips. Slightly dry mouth. Dizziness. Dark urine or less urine than normal. Muscle cramps. You continue to vomit or have diarrhea. Get help right away if you: Have symptoms of dehydration that get worse. Have a fever. Have a severe headache. Have been vomiting and the following happens: Your vomiting gets worse or does not go away. Your vomit includes blood or green matter (bile). You cannot eat or drink without vomiting. Have problems with urination or bowel movements, such as: Diarrhea that gets worse or does not go away. Blood in your stool (feces). This may cause stool to look black and tarry. Not urinating, or urinating only a small amount of very dark urine, within 6-8 hours. Have trouble breathing. Have symptoms that get worse with treatment. These symptoms may represent a serious problem that is an emergency. Do not wait to see if the symptoms will go away. Get medical help right away. Call your local emergency services (911 in the [place] Do not drive yourself to the hospital. Summary Rehydration is the replacement of body fluids and minerals (electrolytes) that are lost during dehydration. Follow instructions from your health care provider for rehydration. The kind of fluid and amount you should drink depend on your condition. Slowly increase how much you drink until you have taken the amount recommended by your health care provider. Contact your health care provider if you continue to show signs of mild or moderate dehydration. This information is not intended to replace advice given to you by your health care provider. Make sure you discuss any questions you have with your health care provider. Document Revised: 02/20/2019 Document Reviewed: 12/31/2018 Elsevier Patient  Education  2023 [employer]  

## 2021-07-01 NOTE — Progress Notes (Signed)
Per Milinda Cave, PA - okay to run IVF over 1 hour at 946m/hr.

## 2021-07-01 NOTE — Telephone Encounter (Signed)
Notified Patient of prior authorization approval for Lorazepam 0.'5mg'$  tablets. Medication is approved through 01/02/2022. No other needs or concerns voiced at this time.

## 2021-07-02 DIAGNOSIS — C50511 Malignant neoplasm of lower-outer quadrant of right female breast: Secondary | ICD-10-CM | POA: Diagnosis not present

## 2021-07-02 DIAGNOSIS — Z170 Estrogen receptor positive status [ER+]: Secondary | ICD-10-CM | POA: Diagnosis not present

## 2021-07-07 ENCOUNTER — Encounter: Payer: Self-pay | Admitting: *Deleted

## 2021-07-08 ENCOUNTER — Telehealth: Payer: Self-pay

## 2021-07-08 NOTE — Telephone Encounter (Signed)
Pt called and states she has had a nagging dry cough for about a week. Pt denies congestion/sore throat, ShOB, fever. Pt states nothing OTC has helped. Pt was offered appt with [facility] today but states she is not able to come d/t transportation, but has agreed to come to [facility] 07/09/21 at 1100. Pt knows to call with any further questions/concerns.

## 2021-07-09 ENCOUNTER — Other Ambulatory Visit: Payer: Self-pay

## 2021-07-09 ENCOUNTER — Inpatient Hospital Stay: Payer: Medicare PPO | Attending: Hematology and Oncology | Admitting: Physician Assistant

## 2021-07-09 ENCOUNTER — Ambulatory Visit (HOSPITAL_COMMUNITY)
Admission: RE | Admit: 2021-07-09 | Discharge: 2021-07-09 | Disposition: A | Discharge status: Home / Self Care | Payer: Medicare PPO | Source: Ambulatory Visit | Attending: Physician Assistant | Admitting: Physician Assistant

## 2021-07-09 ENCOUNTER — Inpatient Hospital Stay: Payer: Medicare PPO

## 2021-07-09 VITALS — BP 120/74 | HR 95 | Temp 98.6°F | Resp 17 | Ht 63.0 in | Wt 167.5 lb

## 2021-07-09 DIAGNOSIS — R059 Cough, unspecified: Secondary | ICD-10-CM | POA: Insufficient documentation

## 2021-07-09 DIAGNOSIS — Z7952 Long term (current) use of systemic steroids: Secondary | ICD-10-CM | POA: Diagnosis not present

## 2021-07-09 DIAGNOSIS — Z170 Estrogen receptor positive status [ER+]: Secondary | ICD-10-CM | POA: Diagnosis not present

## 2021-07-09 DIAGNOSIS — C50511 Malignant neoplasm of lower-outer quadrant of right female breast: Secondary | ICD-10-CM | POA: Insufficient documentation

## 2021-07-09 DIAGNOSIS — R110 Nausea: Secondary | ICD-10-CM

## 2021-07-09 DIAGNOSIS — E860 Dehydration: Secondary | ICD-10-CM | POA: Diagnosis not present

## 2021-07-09 DIAGNOSIS — Z95828 Presence of other vascular implants and grafts: Secondary | ICD-10-CM

## 2021-07-09 DIAGNOSIS — R051 Acute cough: Secondary | ICD-10-CM | POA: Diagnosis not present

## 2021-07-09 DIAGNOSIS — Z79899 Other long term (current) drug therapy: Secondary | ICD-10-CM | POA: Diagnosis not present

## 2021-07-09 DIAGNOSIS — R638 Other symptoms and signs concerning food and fluid intake: Secondary | ICD-10-CM

## 2021-07-09 LAB — CMP (CANCER CENTER ONLY)
ALT: 8 U/L (ref 0–44)
AST: 12 U/L — ABNORMAL LOW (ref 15–41)
Albumin: 3.6 g/dL (ref 3.5–5.0)
Alkaline Phosphatase: 98 U/L (ref 38–126)
Anion gap: 8 (ref 5–15)
BUN: 8 mg/dL (ref 8–23)
CO2: 27 mmol/L (ref 22–32)
Calcium: 9.2 mg/dL (ref 8.9–10.3)
Chloride: 105 mmol/L (ref 98–111)
Creatinine: 0.77 mg/dL (ref 0.44–1.00)
GFR, Estimated: >60 mL/min (ref >60)
Glucose, Bld: 107 mg/dL — ABNORMAL HIGH (ref 70–99)
Potassium: 3.5 mmol/L (ref 3.5–5.1)
Sodium: 140 mmol/L (ref 135–145)
Total Bilirubin: 0.4 mg/dL (ref 0.3–1.2)
Total Protein: 6.8 g/dL (ref 6.5–8.1)

## 2021-07-09 LAB — CBC WITH DIFFERENTIAL (CANCER CENTER ONLY)
Abs Immature Granulocytes: 0.45 10*3/uL — ABNORMAL HIGH (ref 0.00–0.07)
Basophils Absolute: 0 10*3/uL (ref 0.0–0.1)
Basophils Relative: 0 %
Eosinophils Absolute: 0 10*3/uL (ref 0.0–0.5)
Eosinophils Relative: 0 %
HCT: 32.4 % — ABNORMAL LOW (ref 36.0–46.0)
Hemoglobin: 10.7 g/dL — ABNORMAL LOW (ref 12.0–15.0)
Immature Granulocytes: 4 %
Lymphocytes Relative: 11 %
Lymphs Abs: 1.1 10*3/uL (ref 0.7–4.0)
MCH: 28.2 pg (ref 26.0–34.0)
MCHC: 33 g/dL (ref 30.0–36.0)
MCV: 85.5 fL (ref 80.0–100.0)
Monocytes Absolute: 0.9 10*3/uL (ref 0.1–1.0)
Monocytes Relative: 8 %
Neutro Abs: 7.7 10*3/uL (ref 1.7–7.7)
Neutrophils Relative %: 77 %
Platelet Count: 213 10*3/uL (ref 150–400)
RBC: 3.79 MIL/uL — ABNORMAL LOW (ref 3.87–5.11)
RDW: 17 % — ABNORMAL HIGH (ref 11.5–15.5)
WBC Count: 10.2 10*3/uL (ref 4.0–10.5)
nRBC: 0 % (ref 0.0–0.2)

## 2021-07-09 MED ORDER — LORAZEPAM 2 MG/ML IJ SOLN
0.5000 mg | Freq: Once | INTRAMUSCULAR | Status: AC
  Administered 2021-07-09: 0.5 mg via INTRAVENOUS
  Filled 2021-07-09: qty 1

## 2021-07-09 MED ORDER — SODIUM CHLORIDE 0.9 % IV SOLN: Freq: Once | INTRAVENOUS | Status: AC

## 2021-07-09 MED ORDER — BENZONATATE 100 MG PO CAPS: 100.0000 mg | ORAL_CAPSULE | Freq: Three times a day (TID) | ORAL | 0 refills | Status: DC | PRN

## 2021-07-09 MED ORDER — HEPARIN SOD (PORK) LOCK FLUSH 100 UNIT/ML IV SOLN
500.0000 [IU] | Freq: Once | INTRAVENOUS | Status: AC
  Administered 2021-07-09: 500 [IU]

## 2021-07-09 MED ORDER — SODIUM CHLORIDE 0.9% FLUSH
10.0000 mL | Freq: Once | INTRAVENOUS | Status: AC
  Administered 2021-07-09: 10 mL

## 2021-07-09 NOTE — Patient Instructions (Signed)
Rehydration, Adult Rehydration is the replacement of body fluids, salts, and minerals (electrolytes) that are lost during dehydration. Dehydration is when there is not enough water or other fluids in the body. This happens when you lose more fluids than you take in. Common causes of dehydration include: Not drinking enough fluids. This can occur when you are ill or doing activities that require a lot of energy, especially in hot weather. Conditions that cause loss of water or other fluids, such as diarrhea, vomiting, sweating, or urinating a lot. Other illnesses, such as fever or infection. Certain medicines, such as those that remove excess fluid from the body (diuretics). Symptoms of mild or moderate dehydration may include thirst, dry lips and mouth, and dizziness. Symptoms of severe dehydration may include increased heart rate, confusion, fainting, and not urinating. For severe dehydration, you may need to get fluids through an IV at the hospital. For mild or moderate dehydration, you can usually rehydrate at home by drinking certain fluids as told by your health care provider. What are the risks? Generally, rehydration is safe. However, taking in too much fluid (overhydration) can be a problem. This is rare. Overhydration can cause an electrolyte imbalance, kidney failure, or a decrease in salt (sodium) levels in the body. Supplies needed You will need an oral rehydration solution (ORS) if your health care provider tells you to use one. This is a drink to treat dehydration. It can be found in pharmacies and retail stores. How to rehydrate Fluids Follow instructions from your health care provider for rehydration. The kind of fluid and the amount you should drink depend on your condition. In general, you should choose drinks that you prefer. If told by your health care provider, drink an ORS. Make an ORS by following instructions on the package. Start by drinking small amounts, about  cup (120  mL) every 5-10 minutes. Slowly increase how much you drink until you have taken the amount recommended by your health care provider. Drink enough clear fluids to keep your urine pale yellow. If you were told to drink an ORS, finish it first, then start slowly drinking other clear fluids. Drink fluids such as: Water. This includes sparkling water and flavored water. Drinking only water can lead to having too little sodium in your body (hyponatremia). Follow the advice of your health care provider. Water from ice chips you suck on. Fruit juice with water you add to it (diluted). Sports drinks. Hot or cold herbal teas. Broth-based soups. Milk or milk products. Food Follow instructions from your health care provider about what to eat while you rehydrate. Your health care provider may recommend that you slowly begin eating regular foods in small amounts. Eat foods that contain a healthy balance of electrolytes, such as bananas, oranges, potatoes, tomatoes, and spinach. Avoid foods that are greasy or contain a lot of sugar. In some cases, you may get nutrition through a feeding tube that is passed through your nose and into your stomach (nasogastric tube, or NG tube). This may be done if you have uncontrolled vomiting or diarrhea. Beverages to avoid  Certain beverages may make dehydration worse. While you rehydrate, avoid drinking alcohol. How to tell if you are recovering from dehydration You may be recovering from dehydration if: You are urinating more often than before you started rehydrating. Your urine is pale yellow. Your energy level improves. You vomit less frequently. You have diarrhea less frequently. Your appetite improves or returns to normal. You feel less dizzy or less light-headed.   Your skin tone and color start to look more normal. Follow these instructions at home: Take over-the-counter and prescription medicines only as told by your health care provider. Do not take sodium  tablets. Doing this can lead to having too much sodium in your body (hypernatremia). Contact a health care provider if: You continue to have symptoms of mild or moderate dehydration, such as: Thirst. Dry lips. Slightly dry mouth. Dizziness. Dark urine or less urine than normal. Muscle cramps. You continue to vomit or have diarrhea. Get help right away if you: Have symptoms of dehydration that get worse. Have a fever. Have a severe headache. Have been vomiting and the following happens: Your vomiting gets worse or does not go away. Your vomit includes blood or green matter (bile). You cannot eat or drink without vomiting. Have problems with urination or bowel movements, such as: Diarrhea that gets worse or does not go away. Blood in your stool (feces). This may cause stool to look black and tarry. Not urinating, or urinating only a small amount of very dark urine, within 6-8 hours. Have trouble breathing. Have symptoms that get worse with treatment. These symptoms may represent a serious problem that is an emergency. Do not wait to see if the symptoms will go away. Get medical help right away. Call your local emergency services (911 in the [place] Do not drive yourself to the hospital. Summary Rehydration is the replacement of body fluids and minerals (electrolytes) that are lost during dehydration. Follow instructions from your health care provider for rehydration. The kind of fluid and amount you should drink depend on your condition. Slowly increase how much you drink until you have taken the amount recommended by your health care provider. Contact your health care provider if you continue to show signs of mild or moderate dehydration. This information is not intended to replace advice given to you by your health care provider. Make sure you discuss any questions you have with your health care provider. Document Revised: 02/20/2019 Document Reviewed: 12/31/2018 Elsevier Patient  Education  2023 [employer]  

## 2021-07-09 NOTE — Progress Notes (Signed)
Symptom Management Consult note [place]    Patient Care Team: Harlan Stains, MD as PCP - General (Family Medicine) Mauro Kaufmann, RN as Oncology Nurse Navigator Rockwell Germany, RN as Oncology Nurse Navigator Nicholas Lose, MD as Consulting Physician (Hematology and Oncology) Donnie Mesa, MD as Consulting Physician (General Surgery)    Name of the patient: Catherine [name]  [id number]  05-Apr-1955   Date of visit: 07/09/2021    Chief complaint/ Reason for visit- cough  Oncology History  Malignant neoplasm of lower-outer quadrant of right breast of female, estrogen receptor positive ([place])  01/29/2021 Initial Diagnosis   Screening mammogram detected right breast mass 1.2 cm, axilla normal, biopsy revealed grade 2 IDC with DCIS ER 90%, PR 30%, HER2 2+ by IHC FISH negative   02/08/2021 Cancer Staging   Staging form: Breast, AJCC 8th Edition - Clinical stage from 02/08/2021: Stage IA (cT1c, cN0, cM0, G2, ER+, PR+, HER2: Equivocal) - Signed by Nicholas Lose, MD on 02/08/2021 Stage prefix: Initial diagnosis Histologic grading system: 3 grade system   03/02/2021 Surgery   Right lumpectomy: Grade 2 IDC 1.2 cm with intermediate grade DCIS, focal involvement of anterior medial margin junction, 1/4 lymph nodes positive   03/19/2021 Oncotype testing   Oncotype DX score: 28.  Distant recurrence at 10 years: 17%   03/24/2021 Cancer Staging   Staging form: Breast, AJCC 8th Edition - Pathologic: Stage IA (pT1c, pN1(sn), cM0, G2, ER+, PR+, HER2-, Oncotype DX score: 28) - Signed by Nicholas Lose, MD on 03/24/2021 Method of lymph node assessment: Sentinel lymph node biopsy Multigene prognostic tests performed: Oncotype DX Recurrence score range: Greater than or equal to 11 Histologic grading system: 3 grade system   04/23/2021 -  Chemotherapy   Patient is on Treatment Plan : BREAST TC q21d       Current Therapy: Finished chemotherapy 06/25/21 cytoxan and Taxotere. Had  Udenyca injection 06/28/21.  Interval history- Catherine [name] is a 66 y.o. with oncologic history as above presenting to [facility] today with chief complaint of cough x 2 weeks. Patient reports cough started shortly after her most recent chemotherapy. She describes the cough as dry. She has to spit phlegm after the cough occasionally. She has tried taking cough drops without much improvement. Patient thinks the cough has slightly improved since onset however given that it is still ongoing wanted to get evaluated for it. She also endorses rhinorrhea. She is experiencing chills although states that has been going on since starting chemotherapy. She denies any sick contacts. She admits to rarely leaving the house besides for doctor appointments. She has ativan at home for nausea which seems to be helping. She admits to decreased PO intake over the last several days because her taste has changed during chemo she is not enjoying the foods she used to. She does have protein shakes at home. She denies fever, sinus pressure, otalgia, sore throat, shortness of breath, abdominal pain, nausea, urinary symptoms, myalgias, arthralgias. Plan to start radiation the week of 07/26/21.    ROS  All other systems are reviewed and are negative for acute change except as noted in the HPI.    Allergies  Allergen Reactions   Penicillins Hives     Past Medical History:  Diagnosis Date   Anxiety    Cancer ([place])    right breast cancer   High cholesterol    History of kidney stones    Hypertension    Hypothyroidism  Past Surgical History:  Procedure Laterality Date   ABDOMINAL HYSTERECTOMY     AXILLARY SENTINEL NODE BIOPSY Right 03/02/2021   Procedure: RIGHT AXILLARY SENTINEL NODE BIOPSY;  Surgeon: Donnie Mesa, MD;  Location: [facility];  Service: General;  Laterality: Right;   BREAST LUMPECTOMY WITH RADIOACTIVE SEED AND SENTINEL LYMPH NODE BIOPSY Right 03/02/2021   Procedure: RIGHT BREAST LUMPECTOMY WITH  RADIOACTIVE SEED;  Surgeon: Donnie Mesa, MD;  Location: [place];  Service: General;  Laterality: Right;   KIDNEY SURGERY Right    NECK SURGERY     PORTACATH PLACEMENT N/A 03/31/2021   Procedure: INSERTION PORT-A-CATH;  Surgeon: Donnie Mesa, MD;  Location: [place];  Service: General;  Laterality: N/A;   RE-EXCISION OF BREAST LUMPECTOMY Right 03/31/2021   Procedure: RE-EXCISION OF RIGHT BREAST LUMPECTOMY MARGINS;  Surgeon: Donnie Mesa, MD;  Location: [place];  Service: General;  Laterality: Right;   WISDOM TOOTH EXTRACTION      Social History   Socioeconomic History   Marital status: Married    Spouse name: Not on file   Number of children: Not on file   Years of education: Not on file   Highest education level: Not on file  Occupational History   Not on file  Tobacco Use   Smoking status: Never   Smokeless tobacco: Never  Vaping Use   Vaping Use: Never used  Substance and Sexual Activity   Alcohol use: No   Drug use: No   Sexual activity: Yes    Birth control/protection: Surgical  Other Topics Concern   Not on file  Social History Narrative   Not on file   Social Determinants of Health   Financial Resource Strain: Not on file  Food Insecurity: Not on file  Transportation Needs: Not on file  Physical Activity: Not on file  Stress: Not on file  Social Connections: Not on file  Intimate Partner Violence: Not on file    No family history on file.   Current Outpatient Medications:    benzonatate (TESSALON) 100 MG capsule, Take 1 capsule (100 mg total) by mouth 3 (three) times daily as needed for cough., Disp: 20 capsule, Rfl: 0   acetaminophen (TYLENOL) 500 MG tablet, Take 1,000 mg by mouth every 6 (six) hours as needed for moderate pain., Disp: , Rfl:    Calcium Magnesium Zinc 333-133-5 MG TABS, Take 1 tablet by mouth daily. (Patient taking differently: Take 1 tablet by mouth once a week.), Disp: , Rfl:    dexamethasone (DECADRON) 4 MG tablet, Take 1 tablet (4 mg  total) by mouth daily. Take 1 tablet day before chemo and 1 tablet x 3 days after chemo with food, Disp: 8 tablet, Rfl: 0   diphenhydrAMINE (BENADRYL) 25 MG tablet, Take 25 mg by mouth every 6 (six) hours as needed for allergies., Disp: , Rfl:    diphenoxylate-atropine (LOMOTIL) 2.5-0.025 MG tablet, Take 1 tablet by mouth 4 (four) times daily as needed for diarrhea or loose stools., Disp: 60 tablet, Rfl: 1   ergocalciferol (VITAMIN D2) 1.25 MG (50000 UT) capsule, Take 1 capsule (50,000 Units total) by mouth once a week., Disp: , Rfl:    escitalopram (LEXAPRO) 5 MG tablet, Take 5 mg by mouth once a week., Disp: , Rfl:    levothyroxine (SYNTHROID) 50 MCG tablet, Take 1 tablet (50 mcg total) by mouth daily before breakfast., Disp: , Rfl:    lidocaine-prilocaine (EMLA) cream, Apply to affected area once, Disp: 30 g, Rfl: 3   LORazepam (  ATIVAN) 0.5 MG tablet, Take 1 tablet (0.5 mg total) by mouth every 6 (six) hours as needed (nausea)., Disp: 60 tablet, Rfl: 0   metoprolol tartrate (LOPRESSOR) 25 MG tablet, Take 12.5 mg by mouth 2 (two) times daily., Disp: , Rfl:    ondansetron (ZOFRAN) 8 MG tablet, Take 1 tablet (8 mg total) by mouth 2 (two) times daily as needed for refractory nausea / vomiting. Start on day 3 after chemo., Disp: 30 tablet, Rfl: 1   prochlorperazine (COMPAZINE) 10 MG tablet, TAKE 1 TABLET(10 MG) BY MOUTH EVERY 6 HOURS AS NEEDED FOR NAUSEA OR VOMITING, Disp: 30 tablet, Rfl: 1   rosuvastatin (CRESTOR) 5 MG tablet, Take 1 tablet (5 mg total) by mouth daily., Disp: , Rfl:   Current Facility-Administered Medications:    ondansetron (ZOFRAN) injection 8 mg, 8 mg, Intravenous, Once, Nicholas Lose, MD  Facility-Administered Medications Ordered in Other Visits:    ondansetron (ZOFRAN) 4 MG/2ML injection, , , ,    ondansetron (ZOFRAN) 4 MG/2ML injection, , , ,   PHYSICAL EXAM: ECOG FS:1 - Symptomatic but completely ambulatory    Vitals:   07/09/21 1048 07/09/21 1100  BP:  120/74   Pulse:  95  Resp:  17  Temp:  98.6 F (37 C)  TempSrc:  Oral  SpO2:  100%  Weight: 167 lb 8 oz (76 kg) 167 lb 8 oz (76 kg)  Height:  '5\' 3"'  (1.6 m)   Physical Exam Vitals and nursing note reviewed.  Constitutional:      Appearance: She is well-developed. She is not ill-appearing or toxic-appearing.  HENT:     Head: Normocephalic and atraumatic.     Comments: No sinus or temporal tenderness.    Right Ear: External ear normal.     Left Ear: External ear normal.     Nose: Nose normal.     Mouth/Throat:     Mouth: Mucous membranes are dry.     Pharynx: Oropharynx is clear. No oropharyngeal exudate or posterior oropharyngeal erythema.  Eyes:     General: No scleral icterus.       Right eye: No discharge.        Left eye: No discharge.     Conjunctiva/sclera: Conjunctivae normal.  Neck:     Vascular: No JVD.  Cardiovascular:     Rate and Rhythm: Normal rate and regular rhythm.     Pulses: Normal pulses.     Heart sounds: Normal heart sounds.  Pulmonary:     Effort: Pulmonary effort is normal.     Breath sounds: Normal breath sounds. No stridor. No wheezing, rhonchi or rales.  Chest:     Chest wall: No tenderness.  Abdominal:     General: There is no distension.  Musculoskeletal:        General: Normal range of motion.     Cervical back: Normal range of motion.  Skin:    General: Skin is warm and dry.  Neurological:     Mental Status: She is oriented to person, place, and time.     GCS: GCS eye subscore is 4. GCS verbal subscore is 5. GCS motor subscore is 6.     Comments: Fluent speech, no facial droop.  Psychiatric:        Behavior: Behavior normal.        LABORATORY DATA: I have reviewed the data as listed    Latest Ref Rng & Units 07/09/2021   11:33 AM 06/25/2021    9:46 AM 06/04/2021  8:43 AM  CBC  WBC 4.0 - 10.5 K/uL 10.2  3.3  3.3   Hemoglobin 12.0 - 15.0 g/dL 10.7  10.6  10.6   Hematocrit 36.0 - 46.0 % 32.4  31.8  32.7   Platelets 150 - 400 K/uL 213   261  248         Latest Ref Rng & Units 07/09/2021   11:33 AM 06/25/2021    9:46 AM 06/04/2021    8:43 AM  CMP  Glucose 70 - 99 mg/dL 107  105  108   BUN 8 - 23 mg/dL '8  12  11   ' Creatinine 0.44 - 1.00 mg/dL 0.77  0.87  0.77   Sodium 135 - 145 mmol/L 140  138  139   Potassium 3.5 - 5.1 mmol/L 3.5  4.0  4.0   Chloride 98 - 111 mmol/L 105  105  107   CO2 22 - 32 mmol/L '27  28  27   ' Calcium 8.9 - 10.3 mg/dL 9.2  9.3  9.3   Total Protein 6.5 - 8.1 g/dL 6.8  6.6  6.3   Total Bilirubin 0.3 - 1.2 mg/dL 0.4  0.3  0.3   Alkaline Phos 38 - 126 U/L 98  72  71   AST 15 - 41 U/L '12  13  15   ' ALT 0 - 44 U/L '8  9  11        ' RADIOGRAPHIC STUDIES: I have personally reviewed the radiological images as listed and agreed with the findings in the report. No images are attached to the encounter. DG Chest 2 View  Result Date: 07/09/2021 CLINICAL DATA:  Cough EXAM: CHEST - 2 VIEW COMPARISON:  Chest x-ray dated March 31, 2021 FINDINGS: Unchanged position of right chest wall port. Cardiac and mediastinal contours are within normal limits. Lungs clear. No pleural effusion or pneumothorax. Mild scoliosis. IMPRESSION: No active cardiopulmonary disease. Electronically Signed   By: Yetta Glassman M.D.   On: 07/09/2021 13:55     ASSESSMENT & PLAN: Patient is a 66 y.o. female  with oncologic history of malignant neoplasm of lower- outer quadrant of right breast, ER positive followed by Dr. Lindi Adie.  I have viewed most recent oncology note and lab work.    #)Cough-patient is nontoxic-appearing.  She is afebrile and hemodynamically stable.  She has a dry cough during exam.  Her lungs are clear to auscultation all fields and she has normal work of breathing.  No sinus tenderness.  CBC and CMP checked and are overall unremarkable. Ddx includes URI, sinusitis, pneumonia, covid. Patient has not had any covid exposures. Chest xray today shows no acute infectious processes.  I viewed imaging and agree with radiologist  impression.  Patient symptoms are most suggestive of URI.  Prescription sent to her pharmacy for [facility].  #) Decreased p.o. intake-patient with dry mucous membranes, appears slightly dehydrated.  She was given a liter of IV fluids here.  She did become nauseous during the visit and was given dose of Ativan here as well.  #)malignant neoplasm of lower- outer quadrant of right breast, ER positive- Finished chemotherapy  Strict ED precautions discussed should symptoms worsen.   Visit Diagnosis: 1. Poor fluid intake   2. Acute cough   3. Malignant neoplasm of lower-outer quadrant of right breast of female, estrogen receptor positive ([place])   4. Nausea without vomiting      Orders Placed This Encounter  Procedures   DG Chest 2 View  Standing Status:   Future    Number of Occurrences:   1    Standing Expiration Date:   07/09/2022    Order Specific Question:   Reason for Exam (SYMPTOM  OR DIAGNOSIS REQUIRED)    Answer:   cough    Order Specific Question:   Preferred imaging location?    Answer:   [facility]    All questions were answered. The patient knows to call the clinic with any problems, questions or concerns. No barriers to learning was detected.  I have spent a total of 30 minutes minutes of face-to-face and non-face-to-face time, preparing to see the patient, obtaining and/or reviewing separately obtained history, performing a medically appropriate examination, counseling and educating the patient, ordering tests,  documenting clinical information in the electronic health record, and care coordination.     Thank you for allowing me to participate in the care of this patient.    Barrie Folk, PA-C Department of Hematology/Oncology [facility] Phone: [phone]  [phone]    07/09/2021 2:49 PM

## 2021-07-13 ENCOUNTER — Ambulatory Visit
Admission: RE | Admit: 2021-07-13 | Discharge: 2021-07-13 | Disposition: A | Discharge status: Home / Self Care | Payer: Medicare PPO | Source: Ambulatory Visit | Attending: Radiation Oncology | Admitting: Radiation Oncology

## 2021-07-13 ENCOUNTER — Other Ambulatory Visit: Payer: Self-pay

## 2021-07-13 DIAGNOSIS — Z170 Estrogen receptor positive status [ER+]: Secondary | ICD-10-CM | POA: Diagnosis not present

## 2021-07-13 DIAGNOSIS — C50511 Malignant neoplasm of lower-outer quadrant of right female breast: Secondary | ICD-10-CM | POA: Insufficient documentation

## 2021-07-13 DIAGNOSIS — Z510 Encounter for antineoplastic radiation therapy: Secondary | ICD-10-CM | POA: Insufficient documentation

## 2021-07-22 ENCOUNTER — Other Ambulatory Visit: Payer: Self-pay | Admitting: Urology

## 2021-07-22 DIAGNOSIS — D49511 Neoplasm of unspecified behavior of right kidney: Secondary | ICD-10-CM

## 2021-07-25 DIAGNOSIS — Z510 Encounter for antineoplastic radiation therapy: Secondary | ICD-10-CM | POA: Diagnosis not present

## 2021-07-25 DIAGNOSIS — C50511 Malignant neoplasm of lower-outer quadrant of right female breast: Secondary | ICD-10-CM | POA: Diagnosis not present

## 2021-07-25 DIAGNOSIS — Z170 Estrogen receptor positive status [ER+]: Secondary | ICD-10-CM | POA: Diagnosis not present

## 2021-07-26 ENCOUNTER — Other Ambulatory Visit: Payer: Self-pay

## 2021-07-27 ENCOUNTER — Ambulatory Visit: Payer: Medicare PPO | Admitting: Radiation Oncology

## 2021-07-27 ENCOUNTER — Other Ambulatory Visit: Payer: Self-pay

## 2021-07-28 ENCOUNTER — Other Ambulatory Visit: Payer: Self-pay

## 2021-07-28 ENCOUNTER — Ambulatory Visit: Payer: Medicare PPO | Admitting: Radiation Oncology

## 2021-07-28 ENCOUNTER — Ambulatory Visit: Payer: Medicare PPO

## 2021-07-28 DIAGNOSIS — C50511 Malignant neoplasm of lower-outer quadrant of right female breast: Secondary | ICD-10-CM | POA: Diagnosis not present

## 2021-07-28 DIAGNOSIS — Z510 Encounter for antineoplastic radiation therapy: Secondary | ICD-10-CM | POA: Diagnosis not present

## 2021-07-28 DIAGNOSIS — Z170 Estrogen receptor positive status [ER+]: Secondary | ICD-10-CM | POA: Diagnosis not present

## 2021-07-28 LAB — RAD ONC ARIA SESSION SUMMARY

## 2021-07-29 ENCOUNTER — Inpatient Hospital Stay: Payer: Medicare PPO | Admitting: Dietician

## 2021-07-29 ENCOUNTER — Ambulatory Visit: Payer: Medicare PPO

## 2021-07-29 ENCOUNTER — Other Ambulatory Visit: Payer: Self-pay

## 2021-07-29 DIAGNOSIS — Z170 Estrogen receptor positive status [ER+]: Secondary | ICD-10-CM | POA: Diagnosis not present

## 2021-07-29 DIAGNOSIS — Z510 Encounter for antineoplastic radiation therapy: Secondary | ICD-10-CM | POA: Diagnosis not present

## 2021-07-29 DIAGNOSIS — C50511 Malignant neoplasm of lower-outer quadrant of right female breast: Secondary | ICD-10-CM | POA: Diagnosis not present

## 2021-07-29 LAB — RAD ONC ARIA SESSION SUMMARY

## 2021-07-29 NOTE — Progress Notes (Signed)
Nutrition Follow-up:  Patient with DCIS of right breast. She completed chemotherapy 6/23. She is currently receiving radiation therapy under the care of Dr. Lisbeth Renshaw.  Met with patient and husband after radiation. She is feeling tired. Her appetite is improving, recalls 2 meals/day. Recalls eggs, hash, bite of toast for breakfast, ensure plus and chicken pot pie for dinner last night. Patient is drinking 1-2 bottles of water. She reports mild constipation. Last bowel movement was this morning. Patient denies nausea, vomiting, diarrhea.    Medications: reviewed   Labs: 7/7 labs reviewed   Anthropometrics:   6/26 - 173 lb 4.8 oz  6/2 - 176 lb 5/2 - 181 lb   NUTRITION DIAGNOSIS: Inadequate oral intake continues   INTERVENTION:  Educated on continued increased calorie and protein needs  Encouraged snacks in between meals and at bedtime- handout with ideas provided Discussed strategies for increasing intake of water and foods with fluids Increase Ensure Plus/equivalent 2x/day One complimentary case of Ensure Plus High Protein provided     MONITORING, EVALUATION, GOAL: weight trends, intake    NEXT VISIT: To be scheduled as needed

## 2021-07-30 ENCOUNTER — Ambulatory Visit
Admission: RE | Admit: 2021-07-30 | Discharge: 2021-07-30 | Disposition: A | Discharge status: Home / Self Care | Payer: Medicare PPO | Source: Ambulatory Visit | Attending: Radiation Oncology | Admitting: Radiation Oncology

## 2021-07-30 ENCOUNTER — Other Ambulatory Visit: Payer: Self-pay

## 2021-07-30 DIAGNOSIS — Z170 Estrogen receptor positive status [ER+]: Secondary | ICD-10-CM | POA: Diagnosis not present

## 2021-07-30 DIAGNOSIS — Z510 Encounter for antineoplastic radiation therapy: Secondary | ICD-10-CM | POA: Diagnosis not present

## 2021-07-30 DIAGNOSIS — C50511 Malignant neoplasm of lower-outer quadrant of right female breast: Secondary | ICD-10-CM | POA: Diagnosis not present

## 2021-07-30 LAB — RAD ONC ARIA SESSION SUMMARY

## 2021-07-30 MED ORDER — RADIAPLEXRX EX GEL: Freq: Two times a day (BID) | CUTANEOUS | Status: DC

## 2021-07-30 MED ORDER — ALRA NON-METALLIC DEODORANT (RAD-ONC)
1.0000 | Freq: Once | TOPICAL | Status: AC
  Administered 2021-07-30: 1 via TOPICAL

## 2021-07-30 NOTE — Progress Notes (Addendum)

## 2021-08-02 ENCOUNTER — Ambulatory Visit
Admission: RE | Admit: 2021-08-02 | Discharge: 2021-08-02 | Disposition: A | Discharge status: Home / Self Care | Payer: Medicare PPO | Source: Ambulatory Visit | Attending: Radiation Oncology | Admitting: Radiation Oncology

## 2021-08-02 ENCOUNTER — Other Ambulatory Visit: Payer: Self-pay

## 2021-08-02 DIAGNOSIS — C50511 Malignant neoplasm of lower-outer quadrant of right female breast: Secondary | ICD-10-CM | POA: Diagnosis not present

## 2021-08-02 DIAGNOSIS — Z170 Estrogen receptor positive status [ER+]: Secondary | ICD-10-CM | POA: Diagnosis not present

## 2021-08-02 DIAGNOSIS — Z510 Encounter for antineoplastic radiation therapy: Secondary | ICD-10-CM | POA: Diagnosis not present

## 2021-08-02 LAB — RAD ONC ARIA SESSION SUMMARY

## 2021-08-03 ENCOUNTER — Ambulatory Visit
Admission: RE | Admit: 2021-08-03 | Discharge: 2021-08-03 | Disposition: A | Discharge status: Home / Self Care | Payer: Medicare PPO | Source: Ambulatory Visit | Attending: Radiation Oncology | Admitting: Radiation Oncology

## 2021-08-03 ENCOUNTER — Other Ambulatory Visit: Payer: Self-pay

## 2021-08-03 DIAGNOSIS — C50511 Malignant neoplasm of lower-outer quadrant of right female breast: Secondary | ICD-10-CM | POA: Insufficient documentation

## 2021-08-03 DIAGNOSIS — Z170 Estrogen receptor positive status [ER+]: Secondary | ICD-10-CM | POA: Diagnosis not present

## 2021-08-03 DIAGNOSIS — Z510 Encounter for antineoplastic radiation therapy: Secondary | ICD-10-CM | POA: Diagnosis not present

## 2021-08-03 LAB — RAD ONC ARIA SESSION SUMMARY

## 2021-08-04 ENCOUNTER — Other Ambulatory Visit: Payer: Self-pay

## 2021-08-04 ENCOUNTER — Ambulatory Visit
Admission: RE | Admit: 2021-08-04 | Discharge: 2021-08-04 | Disposition: A | Discharge status: Home / Self Care | Payer: Medicare PPO | Source: Ambulatory Visit | Attending: Radiation Oncology | Admitting: Radiation Oncology

## 2021-08-04 DIAGNOSIS — Z510 Encounter for antineoplastic radiation therapy: Secondary | ICD-10-CM | POA: Diagnosis not present

## 2021-08-04 DIAGNOSIS — C50511 Malignant neoplasm of lower-outer quadrant of right female breast: Secondary | ICD-10-CM | POA: Diagnosis not present

## 2021-08-04 DIAGNOSIS — Z170 Estrogen receptor positive status [ER+]: Secondary | ICD-10-CM | POA: Diagnosis not present

## 2021-08-04 LAB — RAD ONC ARIA SESSION SUMMARY

## 2021-08-05 ENCOUNTER — Ambulatory Visit
Admission: RE | Admit: 2021-08-05 | Discharge: 2021-08-05 | Disposition: A | Discharge status: Home / Self Care | Payer: Medicare PPO | Source: Ambulatory Visit | Attending: Radiation Oncology | Admitting: Radiation Oncology

## 2021-08-05 ENCOUNTER — Other Ambulatory Visit: Payer: Self-pay

## 2021-08-05 DIAGNOSIS — Z510 Encounter for antineoplastic radiation therapy: Secondary | ICD-10-CM | POA: Diagnosis not present

## 2021-08-05 DIAGNOSIS — C50511 Malignant neoplasm of lower-outer quadrant of right female breast: Secondary | ICD-10-CM | POA: Diagnosis not present

## 2021-08-05 DIAGNOSIS — Z170 Estrogen receptor positive status [ER+]: Secondary | ICD-10-CM | POA: Diagnosis not present

## 2021-08-05 LAB — RAD ONC ARIA SESSION SUMMARY

## 2021-08-06 ENCOUNTER — Ambulatory Visit
Admission: RE | Admit: 2021-08-06 | Discharge: 2021-08-06 | Disposition: A | Discharge status: Home / Self Care | Payer: Medicare PPO | Source: Ambulatory Visit | Attending: Radiation Oncology | Admitting: Radiation Oncology

## 2021-08-06 ENCOUNTER — Other Ambulatory Visit: Payer: Self-pay

## 2021-08-06 DIAGNOSIS — Z170 Estrogen receptor positive status [ER+]: Secondary | ICD-10-CM | POA: Diagnosis not present

## 2021-08-06 DIAGNOSIS — Z510 Encounter for antineoplastic radiation therapy: Secondary | ICD-10-CM | POA: Diagnosis not present

## 2021-08-06 DIAGNOSIS — C50511 Malignant neoplasm of lower-outer quadrant of right female breast: Secondary | ICD-10-CM | POA: Diagnosis not present

## 2021-08-06 LAB — RAD ONC ARIA SESSION SUMMARY

## 2021-08-09 ENCOUNTER — Ambulatory Visit
Admission: RE | Admit: 2021-08-09 | Discharge: 2021-08-09 | Disposition: A | Discharge status: Home / Self Care | Payer: Medicare PPO | Source: Ambulatory Visit | Attending: Radiation Oncology | Admitting: Radiation Oncology

## 2021-08-09 ENCOUNTER — Other Ambulatory Visit: Payer: Self-pay

## 2021-08-09 DIAGNOSIS — Z510 Encounter for antineoplastic radiation therapy: Secondary | ICD-10-CM | POA: Diagnosis not present

## 2021-08-09 DIAGNOSIS — C50511 Malignant neoplasm of lower-outer quadrant of right female breast: Secondary | ICD-10-CM | POA: Diagnosis not present

## 2021-08-09 DIAGNOSIS — Z170 Estrogen receptor positive status [ER+]: Secondary | ICD-10-CM | POA: Diagnosis not present

## 2021-08-09 LAB — RAD ONC ARIA SESSION SUMMARY
Course Elapsed Days: 12
Plan Fractions Treated to Date: 9
Plan Fractions Treated to Date: 9
Plan Prescribed Dose Per Fraction: 1.8 Gy
Plan Prescribed Dose Per Fraction: 1.8 Gy
Plan Total Fractions Prescribed: 28
Plan Total Fractions Prescribed: 28
Plan Total Prescribed Dose: 50.4 Gy
Plan Total Prescribed Dose: 50.4 Gy
Reference Point Dosage Given to Date: 16.2 Gy
Reference Point Dosage Given to Date: 16.2 Gy
Reference Point Session Dosage Given: 1.8 Gy
Reference Point Session Dosage Given: 1.8 Gy
Session Number: 9

## 2021-08-10 ENCOUNTER — Other Ambulatory Visit: Payer: Self-pay

## 2021-08-10 ENCOUNTER — Ambulatory Visit
Admission: RE | Admit: 2021-08-10 | Discharge: 2021-08-10 | Disposition: A | Discharge status: Home / Self Care | Payer: Medicare PPO | Source: Ambulatory Visit | Attending: Radiation Oncology | Admitting: Radiation Oncology

## 2021-08-10 DIAGNOSIS — C50511 Malignant neoplasm of lower-outer quadrant of right female breast: Secondary | ICD-10-CM | POA: Diagnosis not present

## 2021-08-10 DIAGNOSIS — Z170 Estrogen receptor positive status [ER+]: Secondary | ICD-10-CM | POA: Diagnosis not present

## 2021-08-10 DIAGNOSIS — Z510 Encounter for antineoplastic radiation therapy: Secondary | ICD-10-CM | POA: Diagnosis not present

## 2021-08-10 LAB — RAD ONC ARIA SESSION SUMMARY

## 2021-08-11 ENCOUNTER — Other Ambulatory Visit: Payer: Self-pay

## 2021-08-11 ENCOUNTER — Ambulatory Visit
Admission: RE | Admit: 2021-08-11 | Discharge: 2021-08-11 | Disposition: A | Discharge status: Home / Self Care | Payer: Medicare PPO | Source: Ambulatory Visit | Attending: Radiation Oncology | Admitting: Radiation Oncology

## 2021-08-11 DIAGNOSIS — Z510 Encounter for antineoplastic radiation therapy: Secondary | ICD-10-CM | POA: Diagnosis not present

## 2021-08-11 DIAGNOSIS — Z170 Estrogen receptor positive status [ER+]: Secondary | ICD-10-CM | POA: Diagnosis not present

## 2021-08-11 DIAGNOSIS — C50511 Malignant neoplasm of lower-outer quadrant of right female breast: Secondary | ICD-10-CM | POA: Diagnosis not present

## 2021-08-11 LAB — RAD ONC ARIA SESSION SUMMARY

## 2021-08-12 ENCOUNTER — Ambulatory Visit
Admission: RE | Admit: 2021-08-12 | Discharge: 2021-08-12 | Disposition: A | Discharge status: Home / Self Care | Payer: Medicare PPO | Source: Ambulatory Visit | Attending: Radiation Oncology | Admitting: Radiation Oncology

## 2021-08-12 ENCOUNTER — Other Ambulatory Visit: Payer: Self-pay

## 2021-08-12 DIAGNOSIS — Z510 Encounter for antineoplastic radiation therapy: Secondary | ICD-10-CM | POA: Diagnosis not present

## 2021-08-12 DIAGNOSIS — C50511 Malignant neoplasm of lower-outer quadrant of right female breast: Secondary | ICD-10-CM | POA: Diagnosis not present

## 2021-08-12 DIAGNOSIS — Z170 Estrogen receptor positive status [ER+]: Secondary | ICD-10-CM | POA: Diagnosis not present

## 2021-08-12 LAB — RAD ONC ARIA SESSION SUMMARY

## 2021-08-13 ENCOUNTER — Ambulatory Visit
Admission: RE | Admit: 2021-08-13 | Discharge: 2021-08-13 | Disposition: A | Discharge status: Home / Self Care | Payer: Medicare PPO | Source: Ambulatory Visit | Attending: Radiation Oncology | Admitting: Radiation Oncology

## 2021-08-13 ENCOUNTER — Other Ambulatory Visit: Payer: Self-pay

## 2021-08-13 DIAGNOSIS — Z170 Estrogen receptor positive status [ER+]: Secondary | ICD-10-CM | POA: Diagnosis not present

## 2021-08-13 DIAGNOSIS — Z510 Encounter for antineoplastic radiation therapy: Secondary | ICD-10-CM | POA: Diagnosis not present

## 2021-08-13 DIAGNOSIS — C50511 Malignant neoplasm of lower-outer quadrant of right female breast: Secondary | ICD-10-CM | POA: Diagnosis not present

## 2021-08-13 LAB — RAD ONC ARIA SESSION SUMMARY
Course Elapsed Days: 16
Plan Fractions Treated to Date: 13
Plan Fractions Treated to Date: 13
Plan Prescribed Dose Per Fraction: 1.8 Gy
Plan Prescribed Dose Per Fraction: 1.8 Gy
Plan Total Fractions Prescribed: 28
Plan Total Fractions Prescribed: 28
Plan Total Prescribed Dose: 50.4 Gy
Plan Total Prescribed Dose: 50.4 Gy
Reference Point Dosage Given to Date: 23.4 Gy
Reference Point Dosage Given to Date: 23.4 Gy
Reference Point Session Dosage Given: 1.8 Gy
Reference Point Session Dosage Given: 1.8 Gy
Session Number: 13

## 2021-08-16 ENCOUNTER — Other Ambulatory Visit: Payer: Self-pay

## 2021-08-16 ENCOUNTER — Ambulatory Visit
Admission: RE | Admit: 2021-08-16 | Discharge: 2021-08-16 | Disposition: A | Discharge status: Home / Self Care | Payer: Medicare PPO | Source: Ambulatory Visit | Attending: Radiation Oncology | Admitting: Radiation Oncology

## 2021-08-16 DIAGNOSIS — Z510 Encounter for antineoplastic radiation therapy: Secondary | ICD-10-CM | POA: Diagnosis not present

## 2021-08-16 DIAGNOSIS — C50511 Malignant neoplasm of lower-outer quadrant of right female breast: Secondary | ICD-10-CM | POA: Diagnosis not present

## 2021-08-16 DIAGNOSIS — Z170 Estrogen receptor positive status [ER+]: Secondary | ICD-10-CM | POA: Diagnosis not present

## 2021-08-16 LAB — RAD ONC ARIA SESSION SUMMARY
Course Elapsed Days: 19
Plan Fractions Treated to Date: 14
Plan Fractions Treated to Date: 14
Plan Prescribed Dose Per Fraction: 1.8 Gy
Plan Prescribed Dose Per Fraction: 1.8 Gy
Plan Total Fractions Prescribed: 28
Plan Total Fractions Prescribed: 28
Plan Total Prescribed Dose: 50.4 Gy
Plan Total Prescribed Dose: 50.4 Gy
Reference Point Dosage Given to Date: 25.2 Gy
Reference Point Dosage Given to Date: 25.2 Gy
Reference Point Session Dosage Given: 1.8 Gy
Reference Point Session Dosage Given: 1.8 Gy
Session Number: 14

## 2021-08-17 ENCOUNTER — Ambulatory Visit
Admission: RE | Admit: 2021-08-17 | Discharge: 2021-08-17 | Disposition: A | Discharge status: Home / Self Care | Payer: Medicare PPO | Source: Ambulatory Visit | Attending: Radiation Oncology | Admitting: Radiation Oncology

## 2021-08-17 ENCOUNTER — Other Ambulatory Visit: Payer: Self-pay

## 2021-08-17 DIAGNOSIS — C50511 Malignant neoplasm of lower-outer quadrant of right female breast: Secondary | ICD-10-CM | POA: Diagnosis not present

## 2021-08-17 DIAGNOSIS — Z510 Encounter for antineoplastic radiation therapy: Secondary | ICD-10-CM | POA: Diagnosis not present

## 2021-08-17 DIAGNOSIS — Z170 Estrogen receptor positive status [ER+]: Secondary | ICD-10-CM | POA: Diagnosis not present

## 2021-08-17 LAB — RAD ONC ARIA SESSION SUMMARY
Course Elapsed Days: 20
Plan Fractions Treated to Date: 15
Plan Fractions Treated to Date: 15
Plan Prescribed Dose Per Fraction: 1.8 Gy
Plan Prescribed Dose Per Fraction: 1.8 Gy
Plan Total Fractions Prescribed: 28
Plan Total Fractions Prescribed: 28
Plan Total Prescribed Dose: 50.4 Gy
Plan Total Prescribed Dose: 50.4 Gy
Reference Point Dosage Given to Date: 27 Gy
Reference Point Dosage Given to Date: 27 Gy
Reference Point Session Dosage Given: 1.8 Gy
Reference Point Session Dosage Given: 1.8 Gy
Session Number: 15

## 2021-08-18 ENCOUNTER — Other Ambulatory Visit: Payer: Self-pay

## 2021-08-18 ENCOUNTER — Ambulatory Visit
Admission: RE | Admit: 2021-08-18 | Discharge: 2021-08-18 | Disposition: A | Discharge status: Home / Self Care | Payer: Medicare PPO | Source: Ambulatory Visit | Attending: Radiation Oncology | Admitting: Radiation Oncology

## 2021-08-18 DIAGNOSIS — Z170 Estrogen receptor positive status [ER+]: Secondary | ICD-10-CM | POA: Diagnosis not present

## 2021-08-18 DIAGNOSIS — C50511 Malignant neoplasm of lower-outer quadrant of right female breast: Secondary | ICD-10-CM | POA: Diagnosis not present

## 2021-08-18 DIAGNOSIS — Z510 Encounter for antineoplastic radiation therapy: Secondary | ICD-10-CM | POA: Diagnosis not present

## 2021-08-18 LAB — RAD ONC ARIA SESSION SUMMARY
Course Elapsed Days: 21
Plan Fractions Treated to Date: 16
Plan Fractions Treated to Date: 16
Plan Prescribed Dose Per Fraction: 1.8 Gy
Plan Prescribed Dose Per Fraction: 1.8 Gy
Plan Total Fractions Prescribed: 28
Plan Total Fractions Prescribed: 28
Plan Total Prescribed Dose: 50.4 Gy
Plan Total Prescribed Dose: 50.4 Gy
Reference Point Dosage Given to Date: 28.8 Gy
Reference Point Dosage Given to Date: 28.8 Gy
Reference Point Session Dosage Given: 1.8 Gy
Reference Point Session Dosage Given: 1.8 Gy
Session Number: 16

## 2021-08-19 ENCOUNTER — Ambulatory Visit
Admission: RE | Admit: 2021-08-19 | Discharge: 2021-08-19 | Disposition: A | Discharge status: Home / Self Care | Payer: Medicare PPO | Source: Ambulatory Visit | Attending: Radiation Oncology | Admitting: Radiation Oncology

## 2021-08-19 ENCOUNTER — Other Ambulatory Visit: Payer: Self-pay

## 2021-08-19 DIAGNOSIS — Z170 Estrogen receptor positive status [ER+]: Secondary | ICD-10-CM | POA: Diagnosis not present

## 2021-08-19 DIAGNOSIS — C50511 Malignant neoplasm of lower-outer quadrant of right female breast: Secondary | ICD-10-CM | POA: Diagnosis not present

## 2021-08-19 DIAGNOSIS — Z510 Encounter for antineoplastic radiation therapy: Secondary | ICD-10-CM | POA: Diagnosis not present

## 2021-08-19 LAB — RAD ONC ARIA SESSION SUMMARY
Course Elapsed Days: 22
Plan Fractions Treated to Date: 17
Plan Fractions Treated to Date: 17
Plan Prescribed Dose Per Fraction: 1.8 Gy
Plan Prescribed Dose Per Fraction: 1.8 Gy
Plan Total Fractions Prescribed: 28
Plan Total Fractions Prescribed: 28
Plan Total Prescribed Dose: 50.4 Gy
Plan Total Prescribed Dose: 50.4 Gy
Reference Point Dosage Given to Date: 30.6 Gy
Reference Point Dosage Given to Date: 30.6 Gy
Reference Point Session Dosage Given: 1.8 Gy
Reference Point Session Dosage Given: 1.8 Gy
Session Number: 17

## 2021-08-20 ENCOUNTER — Ambulatory Visit
Admission: RE | Admit: 2021-08-20 | Discharge: 2021-08-20 | Disposition: A | Discharge status: Home / Self Care | Payer: Medicare PPO | Source: Ambulatory Visit | Attending: Radiation Oncology | Admitting: Radiation Oncology

## 2021-08-20 ENCOUNTER — Other Ambulatory Visit: Payer: Self-pay

## 2021-08-20 DIAGNOSIS — Z170 Estrogen receptor positive status [ER+]: Secondary | ICD-10-CM | POA: Diagnosis not present

## 2021-08-20 DIAGNOSIS — Z510 Encounter for antineoplastic radiation therapy: Secondary | ICD-10-CM | POA: Diagnosis not present

## 2021-08-20 DIAGNOSIS — C50511 Malignant neoplasm of lower-outer quadrant of right female breast: Secondary | ICD-10-CM | POA: Diagnosis not present

## 2021-08-20 LAB — RAD ONC ARIA SESSION SUMMARY
Course Elapsed Days: 23
Plan Fractions Treated to Date: 18
Plan Fractions Treated to Date: 18
Plan Prescribed Dose Per Fraction: 1.8 Gy
Plan Prescribed Dose Per Fraction: 1.8 Gy
Plan Total Fractions Prescribed: 28
Plan Total Fractions Prescribed: 28
Plan Total Prescribed Dose: 50.4 Gy
Plan Total Prescribed Dose: 50.4 Gy
Reference Point Dosage Given to Date: 32.4 Gy
Reference Point Dosage Given to Date: 32.4 Gy
Reference Point Session Dosage Given: 1.8 Gy
Reference Point Session Dosage Given: 1.8 Gy
Session Number: 18

## 2021-08-23 ENCOUNTER — Ambulatory Visit
Admission: RE | Admit: 2021-08-23 | Discharge: 2021-08-23 | Disposition: A | Discharge status: Home / Self Care | Payer: Medicare PPO | Source: Ambulatory Visit | Attending: Radiation Oncology | Admitting: Radiation Oncology

## 2021-08-23 ENCOUNTER — Other Ambulatory Visit: Payer: Self-pay

## 2021-08-23 DIAGNOSIS — C50511 Malignant neoplasm of lower-outer quadrant of right female breast: Secondary | ICD-10-CM | POA: Diagnosis not present

## 2021-08-23 DIAGNOSIS — Z170 Estrogen receptor positive status [ER+]: Secondary | ICD-10-CM | POA: Diagnosis not present

## 2021-08-23 DIAGNOSIS — Z510 Encounter for antineoplastic radiation therapy: Secondary | ICD-10-CM | POA: Diagnosis not present

## 2021-08-23 LAB — RAD ONC ARIA SESSION SUMMARY
Course Elapsed Days: 26
Plan Fractions Treated to Date: 19
Plan Fractions Treated to Date: 19
Plan Prescribed Dose Per Fraction: 1.8 Gy
Plan Prescribed Dose Per Fraction: 1.8 Gy
Plan Total Fractions Prescribed: 28
Plan Total Fractions Prescribed: 28
Plan Total Prescribed Dose: 50.4 Gy
Plan Total Prescribed Dose: 50.4 Gy
Reference Point Dosage Given to Date: 34.2 Gy
Reference Point Dosage Given to Date: 34.2 Gy
Reference Point Session Dosage Given: 1.8 Gy
Reference Point Session Dosage Given: 1.8 Gy
Session Number: 19

## 2021-08-24 ENCOUNTER — Other Ambulatory Visit: Payer: Self-pay

## 2021-08-24 ENCOUNTER — Ambulatory Visit
Admission: RE | Admit: 2021-08-24 | Discharge: 2021-08-24 | Disposition: A | Discharge status: Home / Self Care | Payer: Medicare PPO | Source: Ambulatory Visit | Attending: Radiation Oncology | Admitting: Radiation Oncology

## 2021-08-24 DIAGNOSIS — Z510 Encounter for antineoplastic radiation therapy: Secondary | ICD-10-CM | POA: Diagnosis not present

## 2021-08-24 DIAGNOSIS — Z170 Estrogen receptor positive status [ER+]: Secondary | ICD-10-CM | POA: Diagnosis not present

## 2021-08-24 DIAGNOSIS — C50511 Malignant neoplasm of lower-outer quadrant of right female breast: Secondary | ICD-10-CM | POA: Diagnosis not present

## 2021-08-24 LAB — RAD ONC ARIA SESSION SUMMARY
Course Elapsed Days: 27
Plan Fractions Treated to Date: 20
Plan Fractions Treated to Date: 20
Plan Prescribed Dose Per Fraction: 1.8 Gy
Plan Prescribed Dose Per Fraction: 1.8 Gy
Plan Total Fractions Prescribed: 28
Plan Total Fractions Prescribed: 28
Plan Total Prescribed Dose: 50.4 Gy
Plan Total Prescribed Dose: 50.4 Gy
Reference Point Dosage Given to Date: 36 Gy
Reference Point Dosage Given to Date: 36 Gy
Reference Point Session Dosage Given: 1.8 Gy
Reference Point Session Dosage Given: 1.8 Gy
Session Number: 20

## 2021-08-25 ENCOUNTER — Ambulatory Visit
Admission: RE | Admit: 2021-08-25 | Discharge: 2021-08-25 | Disposition: A | Discharge status: Home / Self Care | Payer: Medicare PPO | Source: Ambulatory Visit | Attending: Radiation Oncology | Admitting: Radiation Oncology

## 2021-08-25 ENCOUNTER — Other Ambulatory Visit: Payer: Self-pay

## 2021-08-25 DIAGNOSIS — C50511 Malignant neoplasm of lower-outer quadrant of right female breast: Secondary | ICD-10-CM | POA: Diagnosis not present

## 2021-08-25 DIAGNOSIS — Z510 Encounter for antineoplastic radiation therapy: Secondary | ICD-10-CM | POA: Diagnosis not present

## 2021-08-25 DIAGNOSIS — Z170 Estrogen receptor positive status [ER+]: Secondary | ICD-10-CM | POA: Diagnosis not present

## 2021-08-25 LAB — RAD ONC ARIA SESSION SUMMARY
Course Elapsed Days: 28
Plan Fractions Treated to Date: 21
Plan Fractions Treated to Date: 21
Plan Prescribed Dose Per Fraction: 1.8 Gy
Plan Prescribed Dose Per Fraction: 1.8 Gy
Plan Total Fractions Prescribed: 28
Plan Total Fractions Prescribed: 28
Plan Total Prescribed Dose: 50.4 Gy
Plan Total Prescribed Dose: 50.4 Gy
Reference Point Dosage Given to Date: 37.8 Gy
Reference Point Dosage Given to Date: 37.8 Gy
Reference Point Session Dosage Given: 1.8 Gy
Reference Point Session Dosage Given: 1.8 Gy
Session Number: 21

## 2021-08-26 ENCOUNTER — Other Ambulatory Visit: Payer: Self-pay

## 2021-08-26 ENCOUNTER — Ambulatory Visit
Admission: RE | Admit: 2021-08-26 | Discharge: 2021-08-26 | Disposition: A | Discharge status: Home / Self Care | Payer: Medicare PPO | Source: Ambulatory Visit | Attending: Radiation Oncology | Admitting: Radiation Oncology

## 2021-08-26 DIAGNOSIS — Z170 Estrogen receptor positive status [ER+]: Secondary | ICD-10-CM | POA: Diagnosis not present

## 2021-08-26 DIAGNOSIS — Z510 Encounter for antineoplastic radiation therapy: Secondary | ICD-10-CM | POA: Diagnosis not present

## 2021-08-26 DIAGNOSIS — C50511 Malignant neoplasm of lower-outer quadrant of right female breast: Secondary | ICD-10-CM | POA: Diagnosis not present

## 2021-08-26 LAB — RAD ONC ARIA SESSION SUMMARY
Course Elapsed Days: 29
Plan Fractions Treated to Date: 22
Plan Fractions Treated to Date: 22
Plan Prescribed Dose Per Fraction: 1.8 Gy
Plan Prescribed Dose Per Fraction: 1.8 Gy
Plan Total Fractions Prescribed: 28
Plan Total Fractions Prescribed: 28
Plan Total Prescribed Dose: 50.4 Gy
Plan Total Prescribed Dose: 50.4 Gy
Reference Point Dosage Given to Date: 39.6 Gy
Reference Point Dosage Given to Date: 39.6 Gy
Reference Point Session Dosage Given: 1.8 Gy
Reference Point Session Dosage Given: 1.8 Gy
Session Number: 22

## 2021-08-27 ENCOUNTER — Ambulatory Visit
Admission: RE | Admit: 2021-08-27 | Discharge: 2021-08-27 | Disposition: A | Discharge status: Home / Self Care | Payer: Medicare PPO | Source: Ambulatory Visit | Attending: Radiation Oncology | Admitting: Radiation Oncology

## 2021-08-27 ENCOUNTER — Ambulatory Visit: Payer: Medicare PPO

## 2021-08-27 ENCOUNTER — Ambulatory Visit: Payer: Medicare PPO | Admitting: Radiation Oncology

## 2021-08-27 ENCOUNTER — Other Ambulatory Visit: Payer: Self-pay

## 2021-08-27 DIAGNOSIS — Z170 Estrogen receptor positive status [ER+]: Secondary | ICD-10-CM | POA: Diagnosis not present

## 2021-08-27 DIAGNOSIS — Z510 Encounter for antineoplastic radiation therapy: Secondary | ICD-10-CM | POA: Diagnosis not present

## 2021-08-27 DIAGNOSIS — C50511 Malignant neoplasm of lower-outer quadrant of right female breast: Secondary | ICD-10-CM | POA: Diagnosis not present

## 2021-08-27 LAB — RAD ONC ARIA SESSION SUMMARY
Course Elapsed Days: 30
Plan Fractions Treated to Date: 23
Plan Fractions Treated to Date: 23
Plan Prescribed Dose Per Fraction: 1.8 Gy
Plan Prescribed Dose Per Fraction: 1.8 Gy
Plan Total Fractions Prescribed: 28
Plan Total Fractions Prescribed: 28
Plan Total Prescribed Dose: 50.4 Gy
Plan Total Prescribed Dose: 50.4 Gy
Reference Point Dosage Given to Date: 41.4 Gy
Reference Point Dosage Given to Date: 41.4 Gy
Reference Point Session Dosage Given: 1.8 Gy
Reference Point Session Dosage Given: 1.8 Gy
Session Number: 23

## 2021-08-30 ENCOUNTER — Other Ambulatory Visit: Payer: Self-pay

## 2021-08-30 ENCOUNTER — Ambulatory Visit
Admission: RE | Admit: 2021-08-30 | Discharge: 2021-08-30 | Disposition: A | Discharge status: Home / Self Care | Payer: Medicare PPO | Source: Ambulatory Visit | Attending: Radiation Oncology | Admitting: Radiation Oncology

## 2021-08-30 DIAGNOSIS — C50511 Malignant neoplasm of lower-outer quadrant of right female breast: Secondary | ICD-10-CM | POA: Diagnosis not present

## 2021-08-30 DIAGNOSIS — Z510 Encounter for antineoplastic radiation therapy: Secondary | ICD-10-CM | POA: Diagnosis not present

## 2021-08-30 DIAGNOSIS — Z170 Estrogen receptor positive status [ER+]: Secondary | ICD-10-CM | POA: Diagnosis not present

## 2021-08-30 LAB — RAD ONC ARIA SESSION SUMMARY
Course Elapsed Days: 33
Plan Fractions Treated to Date: 24
Plan Fractions Treated to Date: 24
Plan Prescribed Dose Per Fraction: 1.8 Gy
Plan Prescribed Dose Per Fraction: 1.8 Gy
Plan Total Fractions Prescribed: 28
Plan Total Fractions Prescribed: 28
Plan Total Prescribed Dose: 50.4 Gy
Plan Total Prescribed Dose: 50.4 Gy
Reference Point Dosage Given to Date: 43.2 Gy
Reference Point Dosage Given to Date: 43.2 Gy
Reference Point Session Dosage Given: 1.8 Gy
Reference Point Session Dosage Given: 1.8 Gy
Session Number: 24

## 2021-08-31 ENCOUNTER — Other Ambulatory Visit: Payer: Self-pay

## 2021-08-31 ENCOUNTER — Ambulatory Visit
Admission: RE | Admit: 2021-08-31 | Discharge: 2021-08-31 | Disposition: A | Discharge status: Home / Self Care | Payer: Medicare PPO | Source: Ambulatory Visit | Attending: Radiation Oncology | Admitting: Radiation Oncology

## 2021-08-31 DIAGNOSIS — C50511 Malignant neoplasm of lower-outer quadrant of right female breast: Secondary | ICD-10-CM | POA: Diagnosis not present

## 2021-08-31 DIAGNOSIS — Z170 Estrogen receptor positive status [ER+]: Secondary | ICD-10-CM | POA: Diagnosis not present

## 2021-08-31 DIAGNOSIS — Z510 Encounter for antineoplastic radiation therapy: Secondary | ICD-10-CM | POA: Diagnosis not present

## 2021-08-31 LAB — RAD ONC ARIA SESSION SUMMARY
Course Elapsed Days: 34
Plan Fractions Treated to Date: 25
Plan Fractions Treated to Date: 25
Plan Prescribed Dose Per Fraction: 1.8 Gy
Plan Prescribed Dose Per Fraction: 1.8 Gy
Plan Total Fractions Prescribed: 28
Plan Total Fractions Prescribed: 28
Plan Total Prescribed Dose: 50.4 Gy
Plan Total Prescribed Dose: 50.4 Gy
Reference Point Dosage Given to Date: 45 Gy
Reference Point Dosage Given to Date: 45 Gy
Reference Point Session Dosage Given: 1.8 Gy
Reference Point Session Dosage Given: 1.8 Gy
Session Number: 25

## 2021-09-01 ENCOUNTER — Other Ambulatory Visit: Payer: Self-pay

## 2021-09-01 ENCOUNTER — Ambulatory Visit
Admission: RE | Admit: 2021-09-01 | Discharge: 2021-09-01 | Disposition: A | Discharge status: Home / Self Care | Payer: Medicare PPO | Source: Ambulatory Visit | Attending: Radiation Oncology | Admitting: Radiation Oncology

## 2021-09-01 DIAGNOSIS — Z170 Estrogen receptor positive status [ER+]: Secondary | ICD-10-CM | POA: Diagnosis not present

## 2021-09-01 DIAGNOSIS — C50511 Malignant neoplasm of lower-outer quadrant of right female breast: Secondary | ICD-10-CM | POA: Diagnosis not present

## 2021-09-01 DIAGNOSIS — Z510 Encounter for antineoplastic radiation therapy: Secondary | ICD-10-CM | POA: Diagnosis not present

## 2021-09-01 LAB — RAD ONC ARIA SESSION SUMMARY
Course Elapsed Days: 35
Plan Fractions Treated to Date: 26
Plan Fractions Treated to Date: 26
Plan Prescribed Dose Per Fraction: 1.8 Gy
Plan Prescribed Dose Per Fraction: 1.8 Gy
Plan Total Fractions Prescribed: 28
Plan Total Fractions Prescribed: 28
Plan Total Prescribed Dose: 50.4 Gy
Plan Total Prescribed Dose: 50.4 Gy
Reference Point Dosage Given to Date: 46.8 Gy
Reference Point Dosage Given to Date: 46.8 Gy
Reference Point Session Dosage Given: 1.8 Gy
Reference Point Session Dosage Given: 1.8 Gy
Session Number: 26

## 2021-09-02 ENCOUNTER — Ambulatory Visit: Payer: Medicare PPO

## 2021-09-02 ENCOUNTER — Other Ambulatory Visit: Payer: Self-pay

## 2021-09-02 ENCOUNTER — Ambulatory Visit
Admission: RE | Admit: 2021-09-02 | Discharge: 2021-09-02 | Disposition: A | Discharge status: Home / Self Care | Payer: Medicare PPO | Source: Ambulatory Visit | Attending: Radiation Oncology | Admitting: Radiation Oncology

## 2021-09-02 DIAGNOSIS — Z170 Estrogen receptor positive status [ER+]: Secondary | ICD-10-CM | POA: Diagnosis not present

## 2021-09-02 DIAGNOSIS — C50511 Malignant neoplasm of lower-outer quadrant of right female breast: Secondary | ICD-10-CM | POA: Diagnosis not present

## 2021-09-02 DIAGNOSIS — Z510 Encounter for antineoplastic radiation therapy: Secondary | ICD-10-CM | POA: Diagnosis not present

## 2021-09-02 LAB — RAD ONC ARIA SESSION SUMMARY
Course Elapsed Days: 36
Plan Fractions Treated to Date: 27
Plan Fractions Treated to Date: 27
Plan Prescribed Dose Per Fraction: 1.8 Gy
Plan Prescribed Dose Per Fraction: 1.8 Gy
Plan Total Fractions Prescribed: 28
Plan Total Fractions Prescribed: 28
Plan Total Prescribed Dose: 50.4 Gy
Plan Total Prescribed Dose: 50.4 Gy
Reference Point Dosage Given to Date: 48.6 Gy
Reference Point Dosage Given to Date: 48.6 Gy
Reference Point Session Dosage Given: 1.8 Gy
Reference Point Session Dosage Given: 1.8 Gy
Session Number: 27

## 2021-09-03 ENCOUNTER — Other Ambulatory Visit: Payer: Self-pay

## 2021-09-03 ENCOUNTER — Ambulatory Visit: Payer: Medicare PPO

## 2021-09-03 ENCOUNTER — Ambulatory Visit
Admission: RE | Admit: 2021-09-03 | Discharge: 2021-09-03 | Disposition: A | Discharge status: Home / Self Care | Payer: Medicare PPO | Source: Ambulatory Visit | Attending: Radiation Oncology | Admitting: Radiation Oncology

## 2021-09-03 DIAGNOSIS — D6481 Anemia due to antineoplastic chemotherapy: Secondary | ICD-10-CM | POA: Diagnosis not present

## 2021-09-03 DIAGNOSIS — Z9221 Personal history of antineoplastic chemotherapy: Secondary | ICD-10-CM | POA: Insufficient documentation

## 2021-09-03 DIAGNOSIS — Z79811 Long term (current) use of aromatase inhibitors: Secondary | ICD-10-CM | POA: Insufficient documentation

## 2021-09-03 DIAGNOSIS — Z923 Personal history of irradiation: Secondary | ICD-10-CM | POA: Insufficient documentation

## 2021-09-03 DIAGNOSIS — Z170 Estrogen receptor positive status [ER+]: Secondary | ICD-10-CM | POA: Diagnosis not present

## 2021-09-03 DIAGNOSIS — C50511 Malignant neoplasm of lower-outer quadrant of right female breast: Secondary | ICD-10-CM | POA: Insufficient documentation

## 2021-09-03 DIAGNOSIS — Z79899 Other long term (current) drug therapy: Secondary | ICD-10-CM | POA: Diagnosis not present

## 2021-09-03 DIAGNOSIS — G620 Drug-induced polyneuropathy: Secondary | ICD-10-CM | POA: Insufficient documentation

## 2021-09-03 DIAGNOSIS — Z510 Encounter for antineoplastic radiation therapy: Secondary | ICD-10-CM | POA: Diagnosis not present

## 2021-09-03 DIAGNOSIS — T451X5D Adverse effect of antineoplastic and immunosuppressive drugs, subsequent encounter: Secondary | ICD-10-CM | POA: Diagnosis not present

## 2021-09-03 LAB — RAD ONC ARIA SESSION SUMMARY
Course Elapsed Days: 37
Plan Fractions Treated to Date: 28
Plan Fractions Treated to Date: 28
Plan Prescribed Dose Per Fraction: 1.8 Gy
Plan Prescribed Dose Per Fraction: 1.8 Gy
Plan Total Fractions Prescribed: 28
Plan Total Fractions Prescribed: 28
Plan Total Prescribed Dose: 50.4 Gy
Plan Total Prescribed Dose: 50.4 Gy
Reference Point Dosage Given to Date: 50.4 Gy
Reference Point Dosage Given to Date: 50.4 Gy
Reference Point Session Dosage Given: 1.8 Gy
Reference Point Session Dosage Given: 1.8 Gy
Session Number: 28

## 2021-09-07 ENCOUNTER — Ambulatory Visit: Payer: Medicare PPO

## 2021-09-07 ENCOUNTER — Ambulatory Visit
Admission: RE | Admit: 2021-09-07 | Discharge: 2021-09-07 | Disposition: A | Discharge status: Home / Self Care | Payer: Medicare PPO | Source: Ambulatory Visit | Attending: Radiation Oncology | Admitting: Radiation Oncology

## 2021-09-07 ENCOUNTER — Other Ambulatory Visit: Payer: Self-pay

## 2021-09-07 DIAGNOSIS — T451X5D Adverse effect of antineoplastic and immunosuppressive drugs, subsequent encounter: Secondary | ICD-10-CM | POA: Diagnosis not present

## 2021-09-07 DIAGNOSIS — Z510 Encounter for antineoplastic radiation therapy: Secondary | ICD-10-CM | POA: Diagnosis not present

## 2021-09-07 DIAGNOSIS — Z79899 Other long term (current) drug therapy: Secondary | ICD-10-CM | POA: Diagnosis not present

## 2021-09-07 DIAGNOSIS — D6481 Anemia due to antineoplastic chemotherapy: Secondary | ICD-10-CM | POA: Diagnosis not present

## 2021-09-07 DIAGNOSIS — Z170 Estrogen receptor positive status [ER+]: Secondary | ICD-10-CM | POA: Diagnosis not present

## 2021-09-07 DIAGNOSIS — Z9221 Personal history of antineoplastic chemotherapy: Secondary | ICD-10-CM | POA: Diagnosis not present

## 2021-09-07 DIAGNOSIS — Z79811 Long term (current) use of aromatase inhibitors: Secondary | ICD-10-CM | POA: Diagnosis not present

## 2021-09-07 DIAGNOSIS — G620 Drug-induced polyneuropathy: Secondary | ICD-10-CM | POA: Diagnosis not present

## 2021-09-07 DIAGNOSIS — C50511 Malignant neoplasm of lower-outer quadrant of right female breast: Secondary | ICD-10-CM | POA: Diagnosis not present

## 2021-09-07 LAB — RAD ONC ARIA SESSION SUMMARY
Course Elapsed Days: 41
Plan Fractions Treated to Date: 1
Plan Prescribed Dose Per Fraction: 2 Gy
Plan Total Fractions Prescribed: 5
Plan Total Prescribed Dose: 10 Gy
Reference Point Dosage Given to Date: 52.4 Gy
Reference Point Session Dosage Given: 2 Gy
Session Number: 29

## 2021-09-08 ENCOUNTER — Other Ambulatory Visit: Payer: Self-pay

## 2021-09-08 ENCOUNTER — Ambulatory Visit
Admission: RE | Admit: 2021-09-08 | Discharge: 2021-09-08 | Disposition: A | Discharge status: Home / Self Care | Payer: Medicare PPO | Source: Ambulatory Visit | Attending: Radiation Oncology | Admitting: Radiation Oncology

## 2021-09-08 DIAGNOSIS — Z170 Estrogen receptor positive status [ER+]: Secondary | ICD-10-CM | POA: Diagnosis not present

## 2021-09-08 DIAGNOSIS — Z79811 Long term (current) use of aromatase inhibitors: Secondary | ICD-10-CM | POA: Diagnosis not present

## 2021-09-08 DIAGNOSIS — Z79899 Other long term (current) drug therapy: Secondary | ICD-10-CM | POA: Diagnosis not present

## 2021-09-08 DIAGNOSIS — G620 Drug-induced polyneuropathy: Secondary | ICD-10-CM | POA: Diagnosis not present

## 2021-09-08 DIAGNOSIS — D6481 Anemia due to antineoplastic chemotherapy: Secondary | ICD-10-CM | POA: Diagnosis not present

## 2021-09-08 DIAGNOSIS — Z510 Encounter for antineoplastic radiation therapy: Secondary | ICD-10-CM | POA: Diagnosis not present

## 2021-09-08 DIAGNOSIS — Z9221 Personal history of antineoplastic chemotherapy: Secondary | ICD-10-CM | POA: Diagnosis not present

## 2021-09-08 DIAGNOSIS — C50511 Malignant neoplasm of lower-outer quadrant of right female breast: Secondary | ICD-10-CM | POA: Diagnosis not present

## 2021-09-08 DIAGNOSIS — T451X5D Adverse effect of antineoplastic and immunosuppressive drugs, subsequent encounter: Secondary | ICD-10-CM | POA: Diagnosis not present

## 2021-09-08 LAB — RAD ONC ARIA SESSION SUMMARY
Course Elapsed Days: 42
Plan Fractions Treated to Date: 2
Plan Prescribed Dose Per Fraction: 2 Gy
Plan Total Fractions Prescribed: 5
Plan Total Prescribed Dose: 10 Gy
Reference Point Dosage Given to Date: 54.4 Gy
Reference Point Session Dosage Given: 2 Gy
Session Number: 30

## 2021-09-08 NOTE — Progress Notes (Addendum)
Patient Care Team: Nicholas Lose, MD as PCP - General (Hematology and Oncology) Mauro Kaufmann, RN as Oncology Nurse Navigator Rockwell Germany, RN as Oncology Nurse Navigator Nicholas Lose, MD as Consulting Physician (Hematology and Oncology) Donnie Mesa, MD as Consulting Physician (General Surgery)  DIAGNOSIS:  Encounter Diagnoses  Name Primary?   Malignant neoplasm of lower-outer quadrant of right breast of female, estrogen receptor positive ([place]) Yes   Postmenopausal     SUMMARY OF ONCOLOGIC HISTORY: Oncology History  Malignant neoplasm of lower-outer quadrant of right breast of female, estrogen receptor positive ([place])  01/29/2021 Initial Diagnosis   Screening mammogram detected right breast mass 1.2 cm, axilla normal, biopsy revealed grade 2 IDC with DCIS ER 90%, PR 30%, HER2 2+ by IHC FISH negative   02/08/2021 Cancer Staging   Staging form: Breast, AJCC 8th Edition - Clinical stage from 02/08/2021: Stage IA (cT1c, cN0, cM0, G2, ER+, PR+, HER2: Equivocal) - Signed by Nicholas Lose, MD on 02/08/2021 Stage prefix: Initial diagnosis Histologic grading system: 3 grade system   03/02/2021 Surgery   Right lumpectomy: Grade 2 IDC 1.2 cm with intermediate grade DCIS, focal involvement of anterior medial margin junction, 1/4 lymph nodes positive   03/19/2021 Oncotype testing   Oncotype DX score: 28.  Distant recurrence at 10 years: 17%   03/24/2021 Cancer Staging   Staging form: Breast, AJCC 8th Edition - Pathologic: Stage IA (pT1c, pN1(sn), cM0, G2, ER+, PR+, HER2-, Oncotype DX score: 28) - Signed by Nicholas Lose, MD on 03/24/2021 Method of lymph node assessment: Sentinel lymph node biopsy Multigene prognostic tests performed: Oncotype DX Recurrence score range: Greater than or equal to 11 Histologic grading system: 3 grade system   04/23/2021 - 06/28/2021 Chemotherapy   Patient is on Treatment Plan : BREAST TC q21d       CHIEF COMPLIANT: Follow-up after radiation  INTERVAL  HISTORY: Catherine [name] is  66 - year old reports to the clinic today for a follow-up after surgery to review pathology report. She states that everything went well just just complains of some radiation burns. On the scale of 1-10 her neuropathy is a 7.  She complains of dropping things. She can not hardly feel the gas pedal when she is driving.   ALLERGIES:  is allergic to penicillins.  MEDICATIONS:  Current Outpatient Medications  Medication Sig Dispense Refill   acetaminophen (TYLENOL) 500 MG tablet Take 1,000 mg by mouth every 6 (six) hours as needed for moderate pain.     anastrozole (ARIMIDEX) 1 MG tablet Take 1 tablet (1 mg total) by mouth daily. 90 tablet 3   Calcium Magnesium Zinc 333-133-5 MG TABS Take 1 tablet by mouth daily. (Patient taking differently: Take 1 tablet by mouth once a week.)     diphenhydrAMINE (BENADRYL) 25 MG tablet Take 25 mg by mouth every 6 (six) hours as needed for allergies.     diphenoxylate-atropine (LOMOTIL) 2.5-0.025 MG tablet Take 1 tablet by mouth 4 (four) times daily as needed for diarrhea or loose stools. 60 tablet 1   ergocalciferol (VITAMIN D2) 1.25 MG (50000 UT) capsule Take 1 capsule (50,000 Units total) by mouth once a week.     escitalopram (LEXAPRO) 5 MG tablet Take 5 mg by mouth once a week.     letrozole (FEMARA) 2.5 MG tablet Take 2.5 mg by mouth daily.     levothyroxine (SYNTHROID) 50 MCG tablet Take 1 tablet (50 mcg total) by mouth daily before breakfast.     lidocaine-prilocaine (  EMLA) cream Apply to affected area once 30 g 3   metoprolol tartrate (LOPRESSOR) 25 MG tablet Take 12.5 mg by mouth 2 (two) times daily.     rosuvastatin (CRESTOR) 5 MG tablet Take 1 tablet (5 mg total) by mouth daily.     Current Facility-Administered Medications  Medication Dose Route Frequency Provider Last Rate Last Admin   ondansetron (ZOFRAN) injection 8 mg  8 mg Intravenous Once Nicholas Lose, MD       Facility-Administered Medications Ordered in Other  Visits  Medication Dose Route Frequency Provider Last Rate Last Admin   ondansetron (ZOFRAN) 4 MG/2ML injection            ondansetron (ZOFRAN) 4 MG/2ML injection             PHYSICAL EXAMINATION: ECOG PERFORMANCE STATUS: 1 - Symptomatic but completely ambulatory  Vitals:   09/09/21 0928  BP: (!) 142/80  Pulse: 94  Resp: 16  Temp: (!) 97.2 F (36.2 C)  SpO2: 100%   Filed Weights   09/09/21 0928  Weight: 165 lb 11.2 oz (75.2 kg)      LABORATORY DATA:  I have reviewed the data as listed    Latest Ref Rng & Units 07/09/2021   11:33 AM 06/25/2021    9:46 AM 06/04/2021    8:43 AM  CMP  Glucose 70 - 99 mg/dL 107  105  108   BUN 8 - 23 mg/dL '8  12  11   ' Creatinine 0.44 - 1.00 mg/dL 0.77  0.87  0.77   Sodium 135 - 145 mmol/L 140  138  139   Potassium 3.5 - 5.1 mmol/L 3.5  4.0  4.0   Chloride 98 - 111 mmol/L 105  105  107   CO2 22 - 32 mmol/L '27  28  27   ' Calcium 8.9 - 10.3 mg/dL 9.2  9.3  9.3   Total Protein 6.5 - 8.1 g/dL 6.8  6.6  6.3   Total Bilirubin 0.3 - 1.2 mg/dL 0.4  0.3  0.3   Alkaline Phos 38 - 126 U/L 98  72  71   AST 15 - 41 U/L '12  13  15   ' ALT 0 - 44 U/L '8  9  11     ' Lab Results  Component Value Date   WBC 10.2 07/09/2021   HGB 10.7 (L) 07/09/2021   HCT 32.4 (L) 07/09/2021   MCV 85.5 07/09/2021   PLT 213 07/09/2021   NEUTROABS 7.7 07/09/2021    ASSESSMENT & PLAN:  Malignant neoplasm of lower-outer quadrant of right breast of female, estrogen receptor positive ([place]) /27/2023: Screening mammogram detected 1.2 cm right breast mass: Grade 2 IDC with DCIS ER 90%, PR 30%, HER2 negative Oncotype score: 28: 17% risk of distant recurrence at 10 years     Treatment plan: 1.  Breast conserving surgery with sentinel lymph node biopsy  03/02/2021: Right lumpectomy: Grade 2 IDC 1.2 cm, intermediate grade DCIS, focal anterior and medial margin positive, 1/4 lymph nodes positive 03/31/2021: Reexcision margins: Benign 2. Adjuvant chemotherapy with Taxotere and Cytoxan  every 3 weeks x4 cycles completed 06/25/2021 3.  Adjuvant radiation 07/30/2021-09/13/2021 4.  Followed by adjuvant antiestrogen therapy --------------------------------------------------------------------------------------------------------------------------------  Anastrozole counseling: We discussed the risks and benefits of anti-estrogen therapy with aromatase inhibitors. These include but not limited to insomnia, hot flashes, mood changes, vaginal dryness, bone density loss, and weight gain. We strongly believe that the benefits far outweigh the risks. Patient understands these risks  and consented to starting treatment. Planned treatment duration is 7 years.  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  I recommended participation in the clinical trial with PEA. Chemo-induced anemia: We will recheck her labs today.  We discussed the role of Signatera for minimal residual disease monitoring but she will think about it and inform us.  Return to clinic in 3 months for survivorship care plan visit    Orders Placed This Encounter  Procedures   DG Bone Density    Standing Status:   Future    Standing Expiration Date:   09/10/2022    Order Specific Question:   Reason for Exam (SYMPTOM  OR DIAGNOSIS REQUIRED)    Answer:   postmenopausal    Order Specific Question:   Preferred imaging location?    Answer:   [facility]   CBC with Differential (Cancer Center Only)    Standing Status:   Future    Standing Expiration Date:   09/10/2022   CMP ([place] only)    Standing Status:   Future    Standing Expiration Date:   09/10/2022   Ambulatory referral to Physical Therapy    Referral Priority:   Routine    Referral Type:   Physical Medicine    Referral Reason:   Specialty Services Required    Requested Specialty:   Physical Therapy    Number of Visits Requested:   1   The patient has a good understanding of the overall plan. she agrees with it. she will call with any  problems that may develop before the next visit here. Total time spent: 30 mins including face to face time and time spent for planning, charting and co-ordination of care   Harriette Ohara, MD 09/09/21    I Gardiner Coins am scribing for Dr. Lindi Adie  I have reviewed the above documentation for accuracy and completeness, and I agree with the above.   Addendum: Patient does not want to participate in research. We will prescribe gabapentin 300 mg p.o. nightly.

## 2021-09-09 ENCOUNTER — Encounter: Payer: Self-pay | Admitting: *Deleted

## 2021-09-09 ENCOUNTER — Inpatient Hospital Stay: Payer: Medicare PPO

## 2021-09-09 ENCOUNTER — Inpatient Hospital Stay: Payer: Medicare PPO | Attending: Hematology and Oncology | Admitting: Hematology and Oncology

## 2021-09-09 ENCOUNTER — Ambulatory Visit
Admission: RE | Admit: 2021-09-09 | Discharge: 2021-09-09 | Disposition: A | Discharge status: Home / Self Care | Payer: Medicare PPO | Source: Ambulatory Visit | Attending: Radiation Oncology | Admitting: Radiation Oncology

## 2021-09-09 ENCOUNTER — Other Ambulatory Visit: Payer: Self-pay

## 2021-09-09 ENCOUNTER — Ambulatory Visit: Payer: Self-pay | Admitting: Surgery

## 2021-09-09 VITALS — BP 142/80 | HR 94 | Temp 97.2°F | Resp 16 | Ht 63.0 in | Wt 165.7 lb

## 2021-09-09 DIAGNOSIS — C50511 Malignant neoplasm of lower-outer quadrant of right female breast: Secondary | ICD-10-CM | POA: Diagnosis not present

## 2021-09-09 DIAGNOSIS — D6481 Anemia due to antineoplastic chemotherapy: Secondary | ICD-10-CM | POA: Diagnosis not present

## 2021-09-09 DIAGNOSIS — G620 Drug-induced polyneuropathy: Secondary | ICD-10-CM | POA: Diagnosis not present

## 2021-09-09 DIAGNOSIS — Z510 Encounter for antineoplastic radiation therapy: Secondary | ICD-10-CM | POA: Diagnosis not present

## 2021-09-09 DIAGNOSIS — Z9221 Personal history of antineoplastic chemotherapy: Secondary | ICD-10-CM | POA: Diagnosis not present

## 2021-09-09 DIAGNOSIS — Z780 Asymptomatic menopausal state: Secondary | ICD-10-CM

## 2021-09-09 DIAGNOSIS — T451X5D Adverse effect of antineoplastic and immunosuppressive drugs, subsequent encounter: Secondary | ICD-10-CM | POA: Insufficient documentation

## 2021-09-09 DIAGNOSIS — Z79899 Other long term (current) drug therapy: Secondary | ICD-10-CM | POA: Diagnosis not present

## 2021-09-09 DIAGNOSIS — Z923 Personal history of irradiation: Secondary | ICD-10-CM | POA: Insufficient documentation

## 2021-09-09 DIAGNOSIS — Z79811 Long term (current) use of aromatase inhibitors: Secondary | ICD-10-CM | POA: Insufficient documentation

## 2021-09-09 DIAGNOSIS — Z170 Estrogen receptor positive status [ER+]: Secondary | ICD-10-CM | POA: Insufficient documentation

## 2021-09-09 LAB — CBC WITH DIFFERENTIAL (CANCER CENTER ONLY)
Abs Immature Granulocytes: 0.01 10*3/uL (ref 0.00–0.07)
Basophils Absolute: 0 10*3/uL (ref 0.0–0.1)
Basophils Relative: 1 %
Eosinophils Absolute: 0 10*3/uL (ref 0.0–0.5)
Eosinophils Relative: 1 %
HCT: 36.7 % (ref 36.0–46.0)
Hemoglobin: 12 g/dL (ref 12.0–15.0)
Immature Granulocytes: 1 %
Lymphocytes Relative: 17 %
Lymphs Abs: 0.4 10*3/uL — ABNORMAL LOW (ref 0.7–4.0)
MCH: 28.3 pg (ref 26.0–34.0)
MCHC: 32.7 g/dL (ref 30.0–36.0)
MCV: 86.6 fL (ref 80.0–100.0)
Monocytes Absolute: 0.3 10*3/uL (ref 0.1–1.0)
Monocytes Relative: 16 %
Neutro Abs: 1.4 10*3/uL — ABNORMAL LOW (ref 1.7–7.7)
Neutrophils Relative %: 64 %
Platelet Count: 189 10*3/uL (ref 150–400)
RBC: 4.24 MIL/uL (ref 3.87–5.11)
RDW: 13.7 % (ref 11.5–15.5)
WBC Count: 2.1 10*3/uL — ABNORMAL LOW (ref 4.0–10.5)
nRBC: 0 % (ref 0.0–0.2)

## 2021-09-09 LAB — CMP (CANCER CENTER ONLY)
ALT: 9 U/L (ref 0–44)
AST: 15 U/L (ref 15–41)
Albumin: 4.1 g/dL (ref 3.5–5.0)
Alkaline Phosphatase: 88 U/L (ref 38–126)
Anion gap: 3 — ABNORMAL LOW (ref 5–15)
BUN: 11 mg/dL (ref 8–23)
CO2: 31 mmol/L (ref 22–32)
Calcium: 9.6 mg/dL (ref 8.9–10.3)
Chloride: 103 mmol/L (ref 98–111)
Creatinine: 0.85 mg/dL (ref 0.44–1.00)
GFR, Estimated: >60 mL/min (ref >60)
Glucose, Bld: 97 mg/dL (ref 70–99)
Potassium: 4.3 mmol/L (ref 3.5–5.1)
Sodium: 137 mmol/L (ref 135–145)
Total Bilirubin: 0.3 mg/dL (ref 0.3–1.2)
Total Protein: 7.5 g/dL (ref 6.5–8.1)

## 2021-09-09 LAB — RAD ONC ARIA SESSION SUMMARY
Course Elapsed Days: 43
Plan Fractions Treated to Date: 3
Plan Prescribed Dose Per Fraction: 2 Gy
Plan Total Fractions Prescribed: 5
Plan Total Prescribed Dose: 10 Gy
Reference Point Dosage Given to Date: 56.4 Gy
Reference Point Session Dosage Given: 2 Gy
Session Number: 31

## 2021-09-09 MED ORDER — ANASTROZOLE 1 MG PO TABS: 1.0000 mg | ORAL_TABLET | Freq: Every day | ORAL | 3 refills | Status: DC

## 2021-09-09 MED ORDER — GABAPENTIN 300 MG PO CAPS: 300.0000 mg | ORAL_CAPSULE | Freq: Every day | ORAL | 3 refills | Status: DC

## 2021-09-09 NOTE — Addendum Note (Signed)
Addended by: Nicholas Lose on: 09/09/2021 10:42 AM   Modules accepted: Orders

## 2021-09-09 NOTE — Research (Signed)
[id number] - TREATMENT OF ESTABLISHED CHEMOTHERAPY-INDUCED NEUROPATHY WITH N-PALMITOYLETHANOLAMIDE, A CANNABIMIMETIC NUTRACEUTICAL: A RANDOMIZED DOUBLE-BLIND PHASE II PILOT TRIAL  Received referral from Dr Lindi Adie. Introduced study to patient, who is having symptoms in all extremities. She does not wish to be in a clinical trial. Screen fail complete. Dr Lindi Adie is going to come back in and talk to patient.   Marjie Skiff Dhanvi Boesen, RN, BSN, [facility]  [facility]  [facility] Clinical Research Nurse [place] [phone]  Pager [phone] 09/09/2021 10:44 AM

## 2021-09-09 NOTE — Assessment & Plan Note (Addendum)
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%, HER2 negative Oncotype score: 28: 17% risk of distant recurrence at 10 years   Treatment plan: 1.Breast conserving surgery with sentinel lymph node biopsy 03/02/2021: Right lumpectomy: Grade 2 IDC 1.2 cm, intermediate grade DCIS, focal anterior and medial margin positive, 1/4 lymph nodes positive 03/31/2021: Reexcision margins: Benign 2.Adjuvant chemotherapy with Taxotere and Cytoxan every 3 weeks x4 cycles completed 06/25/2021 3.Adjuvant radiation 07/30/2021-09/13/2021 4.Followed by adjuvant antiestrogen therapy -------------------------------------------------------------------------------------------------------------------------------- Anastrozole counseling: We discussed the risks and benefits of anti-estrogen therapy with aromatase inhibitors. These include but not limited to insomnia, hot flashes, mood changes, vaginal dryness, bone density loss, and weight gain. We strongly believe that the benefits far outweigh the risks. Patient understands these risks and consented to starting treatment. Planned treatment duration is 7 years.  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  I recommended participation in the clinical trial with PEA.  We discussed the role of Signatera for minimal residual disease monitoring but she will think about it and inform us.  Return to clinic in 3 months for survivorship care plan visit

## 2021-09-10 ENCOUNTER — Ambulatory Visit: Payer: Medicare PPO

## 2021-09-10 ENCOUNTER — Other Ambulatory Visit: Payer: Self-pay

## 2021-09-10 ENCOUNTER — Ambulatory Visit
Admission: RE | Admit: 2021-09-10 | Discharge: 2021-09-10 | Disposition: A | Discharge status: Home / Self Care | Payer: Medicare PPO | Source: Ambulatory Visit | Attending: Radiation Oncology | Admitting: Radiation Oncology

## 2021-09-10 DIAGNOSIS — D6481 Anemia due to antineoplastic chemotherapy: Secondary | ICD-10-CM | POA: Diagnosis not present

## 2021-09-10 DIAGNOSIS — Z9221 Personal history of antineoplastic chemotherapy: Secondary | ICD-10-CM | POA: Diagnosis not present

## 2021-09-10 DIAGNOSIS — C50511 Malignant neoplasm of lower-outer quadrant of right female breast: Secondary | ICD-10-CM | POA: Diagnosis not present

## 2021-09-10 DIAGNOSIS — Z170 Estrogen receptor positive status [ER+]: Secondary | ICD-10-CM | POA: Diagnosis not present

## 2021-09-10 DIAGNOSIS — G620 Drug-induced polyneuropathy: Secondary | ICD-10-CM | POA: Diagnosis not present

## 2021-09-10 DIAGNOSIS — Z79811 Long term (current) use of aromatase inhibitors: Secondary | ICD-10-CM | POA: Diagnosis not present

## 2021-09-10 DIAGNOSIS — Z79899 Other long term (current) drug therapy: Secondary | ICD-10-CM | POA: Diagnosis not present

## 2021-09-10 DIAGNOSIS — T451X5D Adverse effect of antineoplastic and immunosuppressive drugs, subsequent encounter: Secondary | ICD-10-CM | POA: Diagnosis not present

## 2021-09-10 DIAGNOSIS — Z510 Encounter for antineoplastic radiation therapy: Secondary | ICD-10-CM | POA: Diagnosis not present

## 2021-09-10 LAB — RAD ONC ARIA SESSION SUMMARY
Course Elapsed Days: 44
Plan Fractions Treated to Date: 4
Plan Prescribed Dose Per Fraction: 2 Gy
Plan Total Fractions Prescribed: 5
Plan Total Prescribed Dose: 10 Gy
Reference Point Dosage Given to Date: 58.4 Gy
Reference Point Session Dosage Given: 2 Gy
Session Number: 32

## 2021-09-13 ENCOUNTER — Other Ambulatory Visit: Payer: Self-pay

## 2021-09-13 ENCOUNTER — Encounter: Payer: Self-pay | Admitting: Radiation Oncology

## 2021-09-13 ENCOUNTER — Ambulatory Visit
Admission: RE | Admit: 2021-09-13 | Discharge: 2021-09-13 | Disposition: A | Discharge status: Home / Self Care | Payer: Medicare PPO | Source: Ambulatory Visit | Attending: Radiation Oncology | Admitting: Radiation Oncology

## 2021-09-13 DIAGNOSIS — Z79899 Other long term (current) drug therapy: Secondary | ICD-10-CM | POA: Diagnosis not present

## 2021-09-13 DIAGNOSIS — Z510 Encounter for antineoplastic radiation therapy: Secondary | ICD-10-CM | POA: Diagnosis not present

## 2021-09-13 DIAGNOSIS — G620 Drug-induced polyneuropathy: Secondary | ICD-10-CM | POA: Diagnosis not present

## 2021-09-13 DIAGNOSIS — D6481 Anemia due to antineoplastic chemotherapy: Secondary | ICD-10-CM | POA: Diagnosis not present

## 2021-09-13 DIAGNOSIS — C50511 Malignant neoplasm of lower-outer quadrant of right female breast: Secondary | ICD-10-CM | POA: Diagnosis not present

## 2021-09-13 DIAGNOSIS — T451X5D Adverse effect of antineoplastic and immunosuppressive drugs, subsequent encounter: Secondary | ICD-10-CM | POA: Diagnosis not present

## 2021-09-13 DIAGNOSIS — Z9221 Personal history of antineoplastic chemotherapy: Secondary | ICD-10-CM | POA: Diagnosis not present

## 2021-09-13 DIAGNOSIS — Z170 Estrogen receptor positive status [ER+]: Secondary | ICD-10-CM | POA: Diagnosis not present

## 2021-09-13 DIAGNOSIS — Z79811 Long term (current) use of aromatase inhibitors: Secondary | ICD-10-CM | POA: Diagnosis not present

## 2021-09-13 LAB — RAD ONC ARIA SESSION SUMMARY
Course Elapsed Days: 47
Plan Fractions Treated to Date: 5
Plan Prescribed Dose Per Fraction: 2 Gy
Plan Total Fractions Prescribed: 5
Plan Total Prescribed Dose: 10 Gy
Reference Point Dosage Given to Date: 60.4 Gy
Reference Point Session Dosage Given: 2 Gy
Session Number: 33

## 2021-09-14 ENCOUNTER — Encounter: Payer: Self-pay | Admitting: Hematology and Oncology

## 2021-09-14 NOTE — Progress Notes (Signed)
                                                                                                                                                             Patient Name: Catherine [name] MRN: [id number] DOB: 07-03-1955 Referring Physician: Nicholas Lose (Profile Not Attached) Date of Service: 09/13/2021 [facility], [place]                                                        End Of Treatment Note  Diagnoses: C50.511-Malignant neoplasm of lower-outer quadrant of right female breast  Cancer Staging: Stage IA, pT1cN1M0, grade 2, ER/PR positive invasive ductal carcinoma of the right breast.  Intent: Curative  Radiation Treatment Dates: 07/28/2021 through 09/13/2021 Site Technique Total Dose (Gy) Dose per Fx (Gy) Completed Fx Beam Energies  Breast, Right: Breast_R 3D 50.4/50.4 1.8 28/28 6X, 10X  Breast, Right: Breast_R_SCLV 3D 50.4/50.4 1.8 28/28 6X, 10X  Breast, Right: Breast_R_Bst 3D 10/10 2 5/5 6X, 10X   Narrative: The patient tolerated radiation therapy relatively well. She developed fatigue and anticipated skin changes in the treatment field.   Plan: The patient will receive a call in about one month from the radiation oncology department. He will continue follow up with Dr. Lindi Adie as well.   ________________________________________________    Carola Rhine, [facility]

## 2021-09-20 ENCOUNTER — Other Ambulatory Visit: Payer: Self-pay

## 2021-09-20 ENCOUNTER — Telehealth: Payer: Self-pay

## 2021-09-20 MED ORDER — MOLNUPIRAVIR EUA 200MG CAPSULE: 4.0000 | ORAL_CAPSULE | Freq: Two times a day (BID) | ORAL | 0 refills | Status: AC

## 2021-09-20 NOTE — Telephone Encounter (Signed)
Pt reports she tested positive for COVID, and checked her temperature, it is 101.4 *F. MD states to give Molnupiravir 4 caps PO  BID x 5 days. Pt was given instructions on dosing and mechanism of action. Advised pt to use OTC cough suppressant and hydrate. She verbalized understanding of all recommendations.

## 2021-09-20 NOTE — Telephone Encounter (Signed)
Pt called and states she has a sore throat, cough, nasal congestion and weakness. She states she went to church Thursday and worse a mask, but was told many of the church members were at home sick with [place]. She denies a fever. Advised pt to take COVID test and call us back. She states her husband will get an OTC COVID test and call us back,

## 2021-09-21 ENCOUNTER — Ambulatory Visit: Payer: Medicare PPO | Admitting: Rehabilitation

## 2021-10-06 ENCOUNTER — Ambulatory Visit (HOSPITAL_BASED_OUTPATIENT_CLINIC_OR_DEPARTMENT_OTHER)
Admission: RE | Admit: 2021-10-06 | Discharge: 2021-10-06 | Disposition: A | Discharge status: Home / Self Care | Payer: Medicare PPO | Source: Ambulatory Visit | Attending: Hematology and Oncology | Admitting: Hematology and Oncology

## 2021-10-06 DIAGNOSIS — C50511 Malignant neoplasm of lower-outer quadrant of right female breast: Secondary | ICD-10-CM | POA: Insufficient documentation

## 2021-10-06 DIAGNOSIS — M8589 Other specified disorders of bone density and structure, multiple sites: Secondary | ICD-10-CM | POA: Diagnosis not present

## 2021-10-06 DIAGNOSIS — Z170 Estrogen receptor positive status [ER+]: Secondary | ICD-10-CM | POA: Insufficient documentation

## 2021-10-06 DIAGNOSIS — Z780 Asymptomatic menopausal state: Secondary | ICD-10-CM | POA: Diagnosis not present

## 2021-10-14 ENCOUNTER — Other Ambulatory Visit: Payer: Self-pay | Admitting: *Deleted

## 2021-10-14 ENCOUNTER — Telehealth: Payer: Self-pay | Admitting: *Deleted

## 2021-10-14 NOTE — Telephone Encounter (Signed)
Received call from pt requesting to review recent bone density test. Per MD pt T score -2.2 and osteopenia.  Verbal orders received from MD to educate pt on OTC calcium 1200 mg p.o daily, vitamin D 25 mcg daily and weight bearing exercises.  Pt educated and verbalized understanding.

## 2021-10-18 ENCOUNTER — Ambulatory Visit
Admission: RE | Admit: 2021-10-18 | Discharge: 2021-10-18 | Disposition: A | Discharge status: Home / Self Care | Payer: Medicare PPO | Source: Ambulatory Visit | Attending: Hematology and Oncology | Admitting: Hematology and Oncology

## 2021-10-18 DIAGNOSIS — Z79899 Other long term (current) drug therapy: Secondary | ICD-10-CM | POA: Insufficient documentation

## 2021-10-18 DIAGNOSIS — Z170 Estrogen receptor positive status [ER+]: Secondary | ICD-10-CM | POA: Insufficient documentation

## 2021-10-18 DIAGNOSIS — T451X5D Adverse effect of antineoplastic and immunosuppressive drugs, subsequent encounter: Secondary | ICD-10-CM | POA: Insufficient documentation

## 2021-10-18 DIAGNOSIS — C50911 Malignant neoplasm of unspecified site of right female breast: Secondary | ICD-10-CM | POA: Diagnosis not present

## 2021-10-18 DIAGNOSIS — Z79811 Long term (current) use of aromatase inhibitors: Secondary | ICD-10-CM | POA: Insufficient documentation

## 2021-10-18 DIAGNOSIS — Z510 Encounter for antineoplastic radiation therapy: Secondary | ICD-10-CM | POA: Insufficient documentation

## 2021-10-18 DIAGNOSIS — G620 Drug-induced polyneuropathy: Secondary | ICD-10-CM | POA: Insufficient documentation

## 2021-10-18 DIAGNOSIS — Z923 Personal history of irradiation: Secondary | ICD-10-CM | POA: Insufficient documentation

## 2021-10-18 DIAGNOSIS — C50511 Malignant neoplasm of lower-outer quadrant of right female breast: Secondary | ICD-10-CM | POA: Insufficient documentation

## 2021-10-18 DIAGNOSIS — Z9221 Personal history of antineoplastic chemotherapy: Secondary | ICD-10-CM | POA: Insufficient documentation

## 2021-10-18 DIAGNOSIS — D6481 Anemia due to antineoplastic chemotherapy: Secondary | ICD-10-CM | POA: Insufficient documentation

## 2021-10-18 NOTE — Progress Notes (Addendum)
  Radiation Oncology         [phone] ________________________________  Name: Catherine [name] MRN: [id number]  Date of Service: 10/18/2021  DOB: 10-25-1955  Post Treatment Telephone Note  Diagnosis:  Stage IA, pT1cN1M0, grade 2, ER/PR positive invasive ductal carcinoma of the right breast.  Intent: Curative  Radiation Treatment Dates: 07/28/2021 through 09/13/2021 Site Technique Total Dose (Gy) Dose per Fx (Gy) Completed Fx Beam Energies  Breast, Right: Breast_R 3D 50.4/50.4 1.8 28/28 6X, 10X  Breast, Right: Breast_R_SCLV 3D 50.4/50.4 1.8 28/28 6X, 10X  Breast, Right: Breast_R_Bst 3D 10/10 2 5/5 6X, 10X  (as documented in provider EOT note)    The patient was available for call today.   Symptoms of fatigue have not improved since completing therapy but has not worsened.  Symptoms of skin changes have  improved since completing therapy.  The patient was encouraged to avoid sun exposure in the area of prior treatment for up to one year following radiation with either sunscreen or by the style of clothing worn in the sun.  The patient has scheduled follow up with her medical oncologist Dr. Lindi Adie for ongoing surveillance, and was encouraged to call if she develops concerns or questions regarding radiation.    Leandra Kern, LPN

## 2021-10-19 DIAGNOSIS — Z452 Encounter for adjustment and management of vascular access device: Secondary | ICD-10-CM | POA: Diagnosis not present

## 2021-10-19 DIAGNOSIS — Z853 Personal history of malignant neoplasm of breast: Secondary | ICD-10-CM | POA: Diagnosis not present

## 2021-11-01 ENCOUNTER — Ambulatory Visit
Admission: RE | Admit: 2021-11-01 | Discharge: 2021-11-01 | Disposition: A | Discharge status: Home / Self Care | Payer: Medicare PPO | Source: Ambulatory Visit | Attending: Urology | Admitting: Urology

## 2021-11-01 DIAGNOSIS — D49511 Neoplasm of unspecified behavior of right kidney: Secondary | ICD-10-CM

## 2021-11-01 DIAGNOSIS — C649 Malignant neoplasm of unspecified kidney, except renal pelvis: Secondary | ICD-10-CM | POA: Diagnosis not present

## 2021-11-01 MED ORDER — GADOPICLENOL 0.5 MMOL/ML IV SOLN
7.5000 mL | Freq: Once | INTRAVENOUS | Status: AC | PRN
  Administered 2021-11-01: 7.5 mL via INTRAVENOUS

## 2021-11-04 ENCOUNTER — Other Ambulatory Visit: Payer: Self-pay

## 2021-11-04 ENCOUNTER — Ambulatory Visit: Payer: Medicare PPO | Attending: Hematology and Oncology | Admitting: Rehabilitation

## 2021-11-04 ENCOUNTER — Encounter: Payer: Self-pay | Admitting: Rehabilitation

## 2021-11-04 DIAGNOSIS — C50511 Malignant neoplasm of lower-outer quadrant of right female breast: Secondary | ICD-10-CM | POA: Insufficient documentation

## 2021-11-04 DIAGNOSIS — Z170 Estrogen receptor positive status [ER+]: Secondary | ICD-10-CM | POA: Diagnosis not present

## 2021-11-04 DIAGNOSIS — M6281 Muscle weakness (generalized): Secondary | ICD-10-CM | POA: Diagnosis not present

## 2021-11-04 DIAGNOSIS — Z483 Aftercare following surgery for neoplasm: Secondary | ICD-10-CM

## 2021-11-04 DIAGNOSIS — Z780 Asymptomatic menopausal state: Secondary | ICD-10-CM | POA: Insufficient documentation

## 2021-11-04 DIAGNOSIS — M25611 Stiffness of right shoulder, not elsewhere classified: Secondary | ICD-10-CM

## 2021-11-04 DIAGNOSIS — R293 Abnormal posture: Secondary | ICD-10-CM

## 2021-11-04 DIAGNOSIS — R209 Unspecified disturbances of skin sensation: Secondary | ICD-10-CM

## 2021-11-04 NOTE — Therapy (Cosign Needed)
OUTPATIENT PHYSICAL THERAPY ONCOLOGY EVALUATION  Patient Name: Catherine [name] MRN: [id number] DOB:30-Jun-1955, 66 y.o., female Today's Date: 11/04/2021   PT End of Session - 11/04/21 1142     Visit Number 1    Number of Visits 13    Date for PT Re-Evaluation 12/23/21    PT Start Time 1100    PT Stop Time 6440    PT Time Calculation (min) 42 min    Activity Tolerance Patient tolerated treatment well    Behavior During Therapy WFL for tasks assessed/performed             Past Medical History:  Diagnosis Date   Anxiety    Cancer ([place])    right breast cancer   High cholesterol    History of kidney stones    Hypertension    Hypothyroidism    Past Surgical History:  Procedure Laterality Date   ABDOMINAL HYSTERECTOMY     AXILLARY SENTINEL NODE BIOPSY Right 03/02/2021   Procedure: RIGHT AXILLARY SENTINEL NODE BIOPSY;  Surgeon: Donnie Mesa, MD;  Location: [place];  Service: General;  Laterality: Right;   BREAST LUMPECTOMY WITH RADIOACTIVE SEED AND SENTINEL LYMPH NODE BIOPSY Right 03/02/2021   Procedure: RIGHT BREAST LUMPECTOMY WITH RADIOACTIVE SEED;  Surgeon: Donnie Mesa, MD;  Location: [place];  Service: General;  Laterality: Right;   KIDNEY SURGERY Right    NECK SURGERY     PORTACATH PLACEMENT N/A 03/31/2021   Procedure: INSERTION PORT-A-CATH;  Surgeon: Donnie Mesa, MD;  Location: [place];  Service: General;  Laterality: N/A;   RE-EXCISION OF BREAST LUMPECTOMY Right 03/31/2021   Procedure: RE-EXCISION OF RIGHT BREAST LUMPECTOMY MARGINS;  Surgeon: Donnie Mesa, MD;  Location: [place];  Service: General;  Laterality: Right;   WISDOM TOOTH EXTRACTION     Patient Active Problem List   Diagnosis Date Noted   Hypothyroidism 06/04/2021   Dyslipidemia 06/04/2021   Moderate recurrent major depression ([place]) 06/04/2021   Vitamin D deficiency 06/04/2021   Other specified disorders of bone density and structure, other site 06/04/2021   Port-A-Cath in place 04/22/2021   Malignant  neoplasm of lower-outer quadrant of right breast of female, estrogen receptor positive ([place]) 02/08/2021    PCP: Nicholas Lose, MD  REFERRING PROVIDER: Nicholas Lose, MD  REFERRING DIAG: right breast cancer  THERAPY DIAG:  Abnormal posture  Aftercare following surgery for neoplasm  Muscle weakness (generalized)  Unspecified disturbances of skin sensation  Stiffness of right shoulder, not elsewhere classified  ONSET DATE: June   Rationale for Evaluation and Treatment: Rehabilitation  SUBJECTIVE:  SUBJECTIVE STATEMENT:  She has noticed some neuropathy after treatment and notice some right arm stiffness. She said she was wearing the compression sock. She is having trouble with walking and balance. The shoulder pain started after radiation. Some neuropathy in hand but feet are worse   PERTINENT HISTORY:  HX of Malignant neoplasm of lower-outer quadrant of right breast of female, estrogen receptor positive ([place]) treated with chemo and radiation   PAIN:  Are you having pain? Yes NPRS scale: 5/10 Pain location: right shoulder Pain orientation: Right  PAIN TYPE: aching and dull Pain description: constant   PRECAUTIONS: None  WEIGHT BEARING RESTRICTIONS: No  FALLS:  Has patient fallen in last 6 months? No  LIVING ENVIRONMENT: Lives with: lives with their family and lives with their spouse Lives in: House/apartment   OCCUPATION: retired   LEISURE:   HAND DOMINANCE: right   PRIOR LEVEL OF FUNCTION: Independent  PATIENT GOALS: help with numbness and tingling in feet    OBJECTIVE:  COGNITION: Overall cognitive status: Within functional limits for tasks assessed   PALPATION:   OBSERVATIONS / OTHER ASSESSMENTS: Mild swelling in ankles and feet   SENSATION: Light touch:  Deficits Right 2 sites felt  / left 5 sites felt   POSTURE: WFL  UPPER EXTREMITY AROM/PROM:  A/PROM RIGHT   eval   Shoulder extension   Shoulder flexion   Shoulder abduction   Shoulder internal rotation   Shoulder external rotation     (Blank rows = not tested)  A/PROM LEFT   eval  Shoulder extension   Shoulder flexion   Shoulder abduction   Shoulder internal rotation   Shoulder external rotation     (Blank rows = not tested)  CERVICAL AROM: All within normal limits:    Percent limited  Flexion   Extension   Right lateral flexion   Left lateral flexion   Right rotation   Left rotation     UPPER EXTREMITY STRENGTH:    LOWER EXTREMITYMMT:  A/PROM Right eval  Hip flexion 4/5  Hip extension   Hip abduction   Hip adduction   Hip internal rotation 3/5  Hip external rotation 4/5  Knee flexion   Knee extension   Ankle dorsiflexion 3/5  Ankle plantarflexion   Ankle inversion   Ankle eversion    (Blank rows = not tested)  A/PROM LEFT eval  Hip flexion 4/5  Hip extension   Hip abduction   Hip adduction   Hip internal rotation 4/5  Hip external rotation 4/5  Knee flexion   Knee extension   Ankle dorsiflexion 3/5  Ankle plantarflexion   Ankle inversion   Ankle eversion    (Blank rows = not tested)  LYMPHEDEMA ASSESSMENTS:   SURGERY TYPE/DATE: 02/10/2021; Right breast lumpectomy and right axillary sentinel Lymph Node biopsy NUMBER OF LYMPH NODES REMOVED: 4 CHEMOTHERAPY: yes RADIATION:yes HORMONE TREATMENT: yes  FUNCTIONAL TESTS:  30 seconds chair stand test- 7  6 minute walk test: 1202 4/10 RPE scale difficulty rating Berg Balance Scale: 41 Grip Strength: R:  20 pounds     L: 45 pounds   GAIT: Distance walked: 1202 feet    TODAY'S TREATMENT:  DATE:  11/04/2021: EVAL  PATIENT EDUCATION:  Education details:  POC Person educated: Patient Education method: Explanation Education comprehension: verbalized understanding  HOME EXERCISE PROGRAM:   ASSESSMENT: CLINICAL IMPRESSION: Patient is a 66 y.o. female  who was seen today for physical therapy evaluation and treatment for lower extremity and upper extremity neuropathy post chemo treatment, and right arm stiffness. Patient demonstrated deficits in functional strength in lower extremity. Patient had some balance deficits with the berg balance test and further testing is recommended to further assess her balance. Patient would be a good candidate for skilled therapy to work on balance, sensation, and strength deficits for better functional independence.   OBJECTIVE IMPAIRMENTS: decreased balance, decreased mobility, difficulty walking, decreased strength, and impaired sensation.   ACTIVITY LIMITATIONS: carrying, standing, and stairs  PARTICIPATION LIMITATIONS: driving and community activity  PERSONAL FACTORS: 1-2 comorbidities: right breast cancer   are also affecting patient's functional outcome.   REHAB POTENTIAL: Good  CLINICAL DECISION MAKING: Evolving/moderate complexity  EVALUATION COMPLEXITY: Moderate  GOALS: Goals reviewed with patient? Yes  LONG TERM GOALS: Target date: 12/23/2021    Pt will be independent with advanced HEP to maintain improvements made throughout therapy Baseline: Goal status: INITIAL  2.  Patient will increase 30 second sit to stands to 12 to show an increase in functional strength  Baseline: 7 Goal status: INITIAL  3.  Patient will increase berg balance score from a 41 to a 50. Baseline: 41 Goal status: INITIAL  4.  Patient will increase all LE strength to at least a 4/5 or greater Baseline:  Goal status: INITIAL   PLAN:  PT FREQUENCY: 2x/week  PT DURATION: 6 weeks  PLANNED INTERVENTIONS: Therapeutic exercises, Therapeutic activity, Neuromuscular re-education, Balance training, Gait training,  Patient/Family education, Self Care, Joint mobilization, Dry Needling, Cryotherapy, Moist heat, Manual lymph drainage, Compression bandaging, scar mobilization, Taping, Biofeedback, and Manual therapy  PLAN FOR NEXT SESSION: Check Arm ROM, and breast tenderness, DGI? May consider transfer to [facility] due to location of clinic.     Rosario Jacks, Student-PT 11/04/2021  12:04 PM

## 2021-11-10 ENCOUNTER — Ambulatory Visit: Payer: Medicare PPO

## 2021-11-11 ENCOUNTER — Encounter: Payer: Medicare PPO | Admitting: Rehabilitation

## 2021-11-17 DIAGNOSIS — D49511 Neoplasm of unspecified behavior of right kidney: Secondary | ICD-10-CM | POA: Diagnosis not present

## 2021-11-18 ENCOUNTER — Encounter: Payer: Self-pay | Admitting: Rehabilitation

## 2021-11-18 ENCOUNTER — Ambulatory Visit: Payer: Medicare PPO | Admitting: Rehabilitation

## 2021-11-18 DIAGNOSIS — R209 Unspecified disturbances of skin sensation: Secondary | ICD-10-CM

## 2021-11-18 DIAGNOSIS — M6281 Muscle weakness (generalized): Secondary | ICD-10-CM | POA: Diagnosis not present

## 2021-11-18 DIAGNOSIS — Z483 Aftercare following surgery for neoplasm: Secondary | ICD-10-CM

## 2021-11-18 DIAGNOSIS — Z170 Estrogen receptor positive status [ER+]: Secondary | ICD-10-CM | POA: Diagnosis not present

## 2021-11-18 DIAGNOSIS — Z780 Asymptomatic menopausal state: Secondary | ICD-10-CM | POA: Diagnosis not present

## 2021-11-18 DIAGNOSIS — M25611 Stiffness of right shoulder, not elsewhere classified: Secondary | ICD-10-CM

## 2021-11-18 DIAGNOSIS — C50511 Malignant neoplasm of lower-outer quadrant of right female breast: Secondary | ICD-10-CM | POA: Diagnosis not present

## 2021-11-18 DIAGNOSIS — R293 Abnormal posture: Secondary | ICD-10-CM

## 2021-11-18 NOTE — Therapy (Signed)
OUTPATIENT PHYSICAL THERAPY ONCOLOGY TREATMENT  Patient Name: Catherine [name] MRN: [id number] DOB:August 25, 1955, 66 y.o., female Today's Date: 11/18/2021   PT End of Session - 11/18/21 1012     Visit Number 2    Number of Visits 13    Date for PT Re-Evaluation 12/23/21    PT Start Time 1010    PT Stop Time 1050    PT Time Calculation (min) 40 min    Activity Tolerance Patient tolerated treatment well    Behavior During Therapy WFL for tasks assessed/performed              Past Medical History:  Diagnosis Date   Anxiety    Cancer ([place])    right breast cancer   High cholesterol    History of kidney stones    Hypertension    Hypothyroidism    Past Surgical History:  Procedure Laterality Date   ABDOMINAL HYSTERECTOMY     AXILLARY SENTINEL NODE BIOPSY Right 03/02/2021   Procedure: RIGHT AXILLARY SENTINEL NODE BIOPSY;  Surgeon: Donnie Mesa, MD;  Location: [place];  Service: General;  Laterality: Right;   BREAST LUMPECTOMY WITH RADIOACTIVE SEED AND SENTINEL LYMPH NODE BIOPSY Right 03/02/2021   Procedure: RIGHT BREAST LUMPECTOMY WITH RADIOACTIVE SEED;  Surgeon: Donnie Mesa, MD;  Location: [place];  Service: General;  Laterality: Right;   KIDNEY SURGERY Right    NECK SURGERY     PORTACATH PLACEMENT N/A 03/31/2021   Procedure: INSERTION PORT-A-CATH;  Surgeon: Donnie Mesa, MD;  Location: [place];  Service: General;  Laterality: N/A;   RE-EXCISION OF BREAST LUMPECTOMY Right 03/31/2021   Procedure: RE-EXCISION OF RIGHT BREAST LUMPECTOMY MARGINS;  Surgeon: Donnie Mesa, MD;  Location: [place];  Service: General;  Laterality: Right;   WISDOM TOOTH EXTRACTION     Patient Active Problem List   Diagnosis Date Noted   Hypothyroidism 06/04/2021   Dyslipidemia 06/04/2021   Moderate recurrent major depression ([place]) 06/04/2021   Vitamin D deficiency 06/04/2021   Other specified disorders of bone density and structure, other site 06/04/2021   Port-A-Cath in place 04/22/2021   Malignant  neoplasm of lower-outer quadrant of right breast of female, estrogen receptor positive ([place]) 02/08/2021    PCP: Nicholas Lose, MD  REFERRING PROVIDER: Nicholas Lose, MD  REFERRING DIAG: right breast cancer  THERAPY DIAG:  Abnormal posture  Stiffness of right shoulder, not elsewhere classified  Muscle weakness (generalized)  Aftercare following surgery for neoplasm  Unspecified disturbances of skin sensation  ONSET DATE: June   Rationale for Evaluation and Treatment: Rehabilitation  SUBJECTIVE:  SUBJECTIVE STATEMENT:  "I feel like I'm walking on air". I am still having the pain the shoulder. The pain is always constant in the shoulder but worse when she lift her arm.   PERTINENT HISTORY:  HX of Malignant neoplasm of lower-outer quadrant of right breast of female, estrogen receptor positive ([place]) treated lumpectomy with 1/4 positive lymph nodes on 03/02/21 with chemo and radiation completed.   PAIN:  Are you having pain? Yes NPRS scale: 6/10 Pain location: right shoulder Pain orientation: Right  PAIN TYPE: aching and dull Pain description: constant   PRECAUTIONS: Rt UE lymphedema risk - needs to progress any UE strenthening slowly  WEIGHT BEARING RESTRICTIONS: No  FALLS:  Has patient fallen in last 6 months? No  LIVING ENVIRONMENT: Lives with: lives with their family and lives with their spouse Lives in: House/apartment   OCCUPATION: retired   LEISURE:   HAND DOMINANCE: right   PRIOR LEVEL OF FUNCTION: Independent  PATIENT GOALS: help with numbness and tingling in feet    OBJECTIVE:  COGNITION: Overall cognitive status: Within functional limits for tasks assessed   PALPATION:   OBSERVATIONS / OTHER ASSESSMENTS: Mild swelling in ankles and feet   SENSATION: Light  touch: Deficits Right 2 sites felt  / left 5 sites felt   POSTURE: WFL  UPPER EXTREMITY AROM/PROM:  A/PROM RIGHT   eval  11/18/2021  Shoulder extension  45  Shoulder flexion  89 painful   Shoulder abduction  82 painful   Shoulder internal rotation  30  Shoulder external rotation  60    (Blank rows = not tested)  LOWER EXTREMITYMMT:  A/PROM Right eval  Hip flexion 4/5  Hip extension   Hip abduction   Hip adduction   Hip internal rotation 3/5  Hip external rotation 4/5  Knee flexion   Knee extension   Ankle dorsiflexion 3/5  Ankle plantarflexion   Ankle inversion   Ankle eversion    (Blank rows = not tested)  A/PROM LEFT eval  Hip flexion 4/5  Hip extension   Hip abduction   Hip adduction   Hip internal rotation 4/5  Hip external rotation 4/5  Knee flexion   Knee extension   Ankle dorsiflexion 3/5  Ankle plantarflexion   Ankle inversion   Ankle eversion    (Blank rows = not tested)  LYMPHEDEMA ASSESSMENTS:   SURGERY TYPE/DATE: 02/10/2021; Right breast lumpectomy and right axillary sentinel Lymph Node biopsy NUMBER OF LYMPH NODES REMOVED: 4 CHEMOTHERAPY: yes RADIATION:yes HORMONE TREATMENT: yes  FUNCTIONAL TESTS:  30 seconds chair stand test- 7  6 minute walk test: 1202 4/10 RPE scale difficulty rating Berg Balance Scale: 41 Grip Strength: R:  20 pounds     L: 45 pounds   GAIT: Distance walked: 1202 feet    TODAY'S TREATMENT:  DATE:   11/18/2021: Exercise: Shoulder assessment Shoulder flex AAROM in supine  Shoulder squeeze  Shoulder roll   NeuroReed - balance walking looking side to side  - balance walking looking up and down  - obstacle course walking over hurdles - stepping over obstacles side ways  - single leg stance on foam 2 x 30 secs  -Sit to stand with foam under one foot bil -towel scrunches    11/04/2021: EVAL  PATIENT EDUCATION:  Education details: POC Person educated: Patient Education method: Explanation Education comprehension: verbalized understanding  HOME EXERCISE PROGRAM: Towel scrunches with toes Scapular retraction Posterior shoulder rolls AAROM flexion seated and supine using opposite UE.    ASSESSMENT: CLINICAL IMPRESSION: Patient responded well to therapy today. Patient presented today with shoulder pain and discomfort . Pain demonstrated very limited and painful right shoulder AROM and PROM. Patient was able to complete more dynamic balance exercise with little loss of balance and required minimal cues to slow down and increase base of support. Patient had difficulty with single leg balance of foam pad and required stand by assistance.Patient would benefit from skilled therapy to work on balance, neuropathy, and shoulder mobility.   OBJECTIVE IMPAIRMENTS: decreased balance, decreased mobility, difficulty walking, decreased strength, and impaired sensation.   ACTIVITY LIMITATIONS: carrying, standing, and stairs  PARTICIPATION LIMITATIONS: driving and community activity  PERSONAL FACTORS: 1-2 comorbidities: right breast cancer   are also affecting patient's functional outcome.   REHAB POTENTIAL: Good  CLINICAL DECISION MAKING: Evolving/moderate complexity  EVALUATION COMPLEXITY: Moderate  GOALS: Goals reviewed with patient? Yes  LONG TERM GOALS: Target date: 12/23/2021    Pt will be independent with advanced HEP to maintain improvements made throughout therapy Baseline: Goal status: INITIAL  2.  Patient will increase 30 second sit to stands to 12 to show an increase in functional strength  Baseline: 7 Goal status: INITIAL  3.  Patient will increase berg balance score from a 41 to a 50. Baseline: 41 Goal status: INITIAL  4.  Patient will increase all LE strength to at least a 4/5 or greater Baseline:  Goal status: INITIAL   PLAN:  PT  FREQUENCY: 2x/week  PT DURATION: 6 weeks  PLANNED INTERVENTIONS: Therapeutic exercises, Therapeutic activity, Neuromuscular re-education, Balance training, Gait training, Patient/Family education, Self Care, Joint mobilization, Dry Needling, Cryotherapy, Moist heat, Manual lymph drainage, Compression bandaging, scar mobilization, Taping, Biofeedback, and Manual therapy  PLAN FOR NEXT SESSION: Passive shoulder ROM, STM to deltoids, pecs,and lats, scapular mobilization. Gait and balance work for CIPN.  Progress any strengthening low weight and slowly.  Pt will be transferring to [facility] due to location.    Rosario Jacks, Student-PT 11/18/2021  10:50 AM

## 2021-11-30 NOTE — Therapy (Signed)
OUTPATIENT PHYSICAL THERAPY ONCOLOGY TREATMENT  Patient Name: Catherine [name] MRN: [id number] DOB:Sep 10, 1955, 66 y.o., female Today's Date: 12/01/2021   PT End of Session - 12/01/21 1011     Visit Number 3    Number of Visits 13    Date for PT Re-Evaluation 12/23/21    PT Start Time 1015    PT Stop Time 1100    PT Time Calculation (min) 45 min    Activity Tolerance Patient tolerated treatment well    Behavior During Therapy WFL for tasks assessed/performed              Past Medical History:  Diagnosis Date   Anxiety    Cancer (HCC)    right breast cancer   High cholesterol    History of kidney stones    Hypertension    Hypothyroidism    Past Surgical History:  Procedure Laterality Date   ABDOMINAL HYSTERECTOMY     AXILLARY SENTINEL NODE BIOPSY Right 03/02/2021   Procedure: RIGHT AXILLARY SENTINEL NODE BIOPSY;  Surgeon: Manus Rudd, MD;  Location: [facility];  Service: General;  Laterality: Right;   BREAST LUMPECTOMY WITH RADIOACTIVE SEED AND SENTINEL LYMPH NODE BIOPSY Right 03/02/2021   Procedure: RIGHT BREAST LUMPECTOMY WITH RADIOACTIVE SEED;  Surgeon: Manus Rudd, MD;  Location: [facility];  Service: General;  Laterality: Right;   KIDNEY SURGERY Right    NECK SURGERY     PORTACATH PLACEMENT N/A 03/31/2021   Procedure: INSERTION PORT-A-CATH;  Surgeon: Manus Rudd, MD;  Location: [facility];  Service: General;  Laterality: N/A;   RE-EXCISION OF BREAST LUMPECTOMY Right 03/31/2021   Procedure: RE-EXCISION OF RIGHT BREAST LUMPECTOMY MARGINS;  Surgeon: Manus Rudd, MD;  Location: [facility];  Service: General;  Laterality: Right;   WISDOM TOOTH EXTRACTION     Patient Active Problem List   Diagnosis Date Noted   Hypothyroidism 06/04/2021   Dyslipidemia 06/04/2021   Moderate recurrent major depression (HCC) 06/04/2021   Vitamin D deficiency 06/04/2021   Other specified disorders of bone density and structure, other site 06/04/2021   Port-A-Cath in place 04/22/2021   Malignant  neoplasm of lower-outer quadrant of right breast of female, estrogen receptor positive (HCC) 02/08/2021    PCP: Serena Croissant, MD  REFERRING PROVIDER: Serena Croissant, MD  REFERRING DIAG: right breast cancer  THERAPY DIAG:  Abnormal posture  Stiffness of right shoulder, not elsewhere classified  Muscle weakness (generalized)  Aftercare following surgery for neoplasm  ONSET DATE: June   Rationale for Evaluation and Treatment: Rehabilitation  SUBJECTIVE:  SUBJECTIVE STATEMENT: I am good, my shoulder still hurts and I can't move it as much. It is about a 5 today.   PERTINENT HISTORY:  HX of Malignant neoplasm of lower-outer quadrant of right breast of female, estrogen receptor positive (HCC) treated lumpectomy with 1/4 positive lymph nodes on 03/02/21 with chemo and radiation completed.   PAIN:  Are you having pain? Yes NPRS scale: 5/10 Pain location: right shoulder Pain orientation: Right  PAIN TYPE: aching and dull Pain description: constant   PRECAUTIONS: Rt UE lymphedema risk - needs to progress any UE strenthening slowly  WEIGHT BEARING RESTRICTIONS: No  FALLS:  Has patient fallen in last 6 months? No  LIVING ENVIRONMENT: Lives with: lives with their family and lives with their spouse Lives in: House/apartment   OCCUPATION: retired   LEISURE:   HAND DOMINANCE: right   PRIOR LEVEL OF FUNCTION: Independent  PATIENT GOALS: help with numbness and tingling in feet    OBJECTIVE:  COGNITION: Overall cognitive status: Within functional limits for tasks assessed   PALPATION:   OBSERVATIONS / OTHER ASSESSMENTS: Mild swelling in ankles and feet   SENSATION: Light touch: Deficits Right 2 sites felt  / left 5 sites felt   POSTURE: WFL  UPPER EXTREMITY AROM/PROM:  A/PROM  RIGHT   eval  11/18/2021  Shoulder extension  45  Shoulder flexion  89 painful   Shoulder abduction  82 painful   Shoulder internal rotation  30  Shoulder external rotation  60    (Blank rows = not tested)  LOWER EXTREMITYMMT:  A/PROM Right eval  Hip flexion 4/5  Hip extension   Hip abduction   Hip adduction   Hip internal rotation 3/5  Hip external rotation 4/5  Knee flexion   Knee extension   Ankle dorsiflexion 3/5  Ankle plantarflexion   Ankle inversion   Ankle eversion    (Blank rows = not tested)  A/PROM LEFT eval  Hip flexion 4/5  Hip extension   Hip abduction   Hip adduction   Hip internal rotation 4/5  Hip external rotation 4/5  Knee flexion   Knee extension   Ankle dorsiflexion 3/5  Ankle plantarflexion   Ankle inversion   Ankle eversion    (Blank rows = not tested)  LYMPHEDEMA ASSESSMENTS:   SURGERY TYPE/DATE: 02/10/2021; Right breast lumpectomy and right axillary sentinel Lymph Node biopsy NUMBER OF LYMPH NODES REMOVED: 4 CHEMOTHERAPY: yes RADIATION:yes HORMONE TREATMENT: yes  FUNCTIONAL TESTS:  30 seconds chair stand test- 7  6 minute walk test: 1202 4/10 RPE scale difficulty rating Berg Balance Scale: 41 Grip Strength: R:  20 pounds     L: 45 pounds   GAIT: Distance walked: 1202 feet    TODAY'S TREATMENT:  DATE:  12/01/21 UBE L1 x1mins  Wall slides flexion and abd x10 each  Rows and ext yellowTB 2x10 PROM all directions  Supine shoulder flexion 2x10 1#  Walking on beam forwards and side steps  Side steps over obstacles- CGA STS on airex 2x10  NuStep L5 x77mins    11/18/2021: Exercise: Shoulder assessment Shoulder flex AAROM in supine  Shoulder squeeze  Shoulder roll   NeuroReed - balance walking looking side to side  - balance walking looking up and down  - obstacle course walking over  hurdles - stepping over obstacles side ways  - single leg stance on foam 2 x 30 secs  -Sit to stand with foam under one foot bil -towel scrunches   11/04/2021: EVAL  PATIENT EDUCATION:  Education details: POC Person educated: Patient Education method: Explanation Education comprehension: verbalized understanding  HOME EXERCISE PROGRAM: Towel scrunches with toes Scapular retraction Posterior shoulder rolls AAROM flexion seated and supine using opposite UE.    ASSESSMENT: CLINICAL IMPRESSION: Patient presented today with shoulder pain and discomfort. She is very limited in shoulder abd and flexion. Increased muscle guarding with PROM esp into flexion, abd, and IR. We worked on some light R shoulder strengthening and ROM. Patient also requested to work on some balance to help with her neuropathy in her feet as they were doing in her previous clinic.   OBJECTIVE IMPAIRMENTS: decreased balance, decreased mobility, difficulty walking, decreased strength, and impaired sensation.   ACTIVITY LIMITATIONS: carrying, standing, and stairs  PARTICIPATION LIMITATIONS: driving and community activity  PERSONAL FACTORS: 1-2 comorbidities: right breast cancer   are also affecting patient's functional outcome.   REHAB POTENTIAL: Good  CLINICAL DECISION MAKING: Evolving/moderate complexity  EVALUATION COMPLEXITY: Moderate  GOALS: Goals reviewed with patient? Yes  LONG TERM GOALS: Target date: 12/23/2021    Pt will be independent with advanced HEP to maintain improvements made throughout therapy Baseline: Goal status: INITIAL  2.  Patient will increase 30 second sit to stands to 12 to show an increase in functional strength  Baseline: 7 Goal status: INITIAL  3.  Patient will increase berg balance score from a 41 to a 50. Baseline: 41 Goal status: INITIAL  4.  Patient will increase all LE strength to at least a 4/5 or greater Baseline:  Goal status: INITIAL   PLAN:  PT  FREQUENCY: 2x/week  PT DURATION: 6 weeks  PLANNED INTERVENTIONS: Therapeutic exercises, Therapeutic activity, Neuromuscular re-education, Balance training, Gait training, Patient/Family education, Self Care, Joint mobilization, Dry Needling, Cryotherapy, Moist heat, Manual lymph drainage, Compression bandaging, scar mobilization, Taping, Biofeedback, and Manual therapy  PLAN FOR NEXT SESSION: Passive shoulder ROM, STM to deltoids, pecs,and lats, scapular mobilization. Gait and balance work neuropathy. Progress any strengthening for R shoulder    Tamia Cofield, Student-PT 12/01/2021  10:58 AM

## 2021-12-01 ENCOUNTER — Ambulatory Visit: Payer: Medicare PPO

## 2021-12-01 DIAGNOSIS — Z483 Aftercare following surgery for neoplasm: Secondary | ICD-10-CM

## 2021-12-01 DIAGNOSIS — Z170 Estrogen receptor positive status [ER+]: Secondary | ICD-10-CM | POA: Diagnosis not present

## 2021-12-01 DIAGNOSIS — Z780 Asymptomatic menopausal state: Secondary | ICD-10-CM | POA: Diagnosis not present

## 2021-12-01 DIAGNOSIS — M6281 Muscle weakness (generalized): Secondary | ICD-10-CM

## 2021-12-01 DIAGNOSIS — R293 Abnormal posture: Secondary | ICD-10-CM

## 2021-12-01 DIAGNOSIS — C50511 Malignant neoplasm of lower-outer quadrant of right female breast: Secondary | ICD-10-CM | POA: Diagnosis not present

## 2021-12-01 DIAGNOSIS — M25611 Stiffness of right shoulder, not elsewhere classified: Secondary | ICD-10-CM

## 2021-12-08 ENCOUNTER — Inpatient Hospital Stay: Payer: Medicare PPO

## 2021-12-08 ENCOUNTER — Ambulatory Visit (HOSPITAL_COMMUNITY)
Admission: RE | Admit: 2021-12-08 | Discharge: 2021-12-08 | Disposition: A | Discharge status: Home / Self Care | Payer: Medicare PPO | Source: Ambulatory Visit | Attending: Adult Health | Admitting: Adult Health

## 2021-12-08 ENCOUNTER — Inpatient Hospital Stay: Payer: Medicare PPO | Attending: Adult Health | Admitting: Adult Health

## 2021-12-08 ENCOUNTER — Other Ambulatory Visit: Payer: Self-pay

## 2021-12-08 ENCOUNTER — Telehealth: Payer: Self-pay

## 2021-12-08 ENCOUNTER — Encounter: Payer: Self-pay | Admitting: Adult Health

## 2021-12-08 VITALS — BP 137/73 | HR 73 | Temp 98.4°F | Resp 16 | Ht 63.0 in | Wt 161.1 lb

## 2021-12-08 DIAGNOSIS — Z170 Estrogen receptor positive status [ER+]: Secondary | ICD-10-CM

## 2021-12-08 DIAGNOSIS — M545 Low back pain, unspecified: Secondary | ICD-10-CM | POA: Insufficient documentation

## 2021-12-08 DIAGNOSIS — Z9221 Personal history of antineoplastic chemotherapy: Secondary | ICD-10-CM | POA: Insufficient documentation

## 2021-12-08 DIAGNOSIS — C50511 Malignant neoplasm of lower-outer quadrant of right female breast: Secondary | ICD-10-CM | POA: Insufficient documentation

## 2021-12-08 DIAGNOSIS — M546 Pain in thoracic spine: Secondary | ICD-10-CM | POA: Diagnosis not present

## 2021-12-08 DIAGNOSIS — Z79811 Long term (current) use of aromatase inhibitors: Secondary | ICD-10-CM | POA: Insufficient documentation

## 2021-12-08 DIAGNOSIS — Z23 Encounter for immunization: Secondary | ICD-10-CM

## 2021-12-08 DIAGNOSIS — Z923 Personal history of irradiation: Secondary | ICD-10-CM | POA: Diagnosis not present

## 2021-12-08 DIAGNOSIS — Z79899 Other long term (current) drug therapy: Secondary | ICD-10-CM | POA: Diagnosis not present

## 2021-12-08 LAB — CMP (CANCER CENTER ONLY)
ALT: 12 U/L (ref 0–44)
AST: 16 U/L (ref 15–41)
Albumin: 4 g/dL (ref 3.5–5.0)
Alkaline Phosphatase: 100 U/L (ref 38–126)
Anion gap: 4 — ABNORMAL LOW (ref 5–15)
BUN: 12 mg/dL (ref 8–23)
CO2: 29 mmol/L (ref 22–32)
Calcium: 10 mg/dL (ref 8.9–10.3)
Chloride: 107 mmol/L (ref 98–111)
Creatinine: 0.97 mg/dL (ref 0.44–1.00)
GFR, Estimated: >60 mL/min (ref >60)
Glucose, Bld: 98 mg/dL (ref 70–99)
Potassium: 4.5 mmol/L (ref 3.5–5.1)
Sodium: 140 mmol/L (ref 135–145)
Total Bilirubin: 0.6 mg/dL (ref 0.3–1.2)
Total Protein: 7.2 g/dL (ref 6.5–8.1)

## 2021-12-08 LAB — CBC WITH DIFFERENTIAL (CANCER CENTER ONLY)
Abs Immature Granulocytes: 0.01 10*3/uL (ref 0.00–0.07)
Basophils Absolute: 0 10*3/uL (ref 0.0–0.1)
Basophils Relative: 0 %
Eosinophils Absolute: 0.1 10*3/uL (ref 0.0–0.5)
Eosinophils Relative: 3 %
HCT: 36.9 % (ref 36.0–46.0)
Hemoglobin: 12.1 g/dL (ref 12.0–15.0)
Immature Granulocytes: 0 %
Lymphocytes Relative: 32 %
Lymphs Abs: 0.9 10*3/uL (ref 0.7–4.0)
MCH: 28.3 pg (ref 26.0–34.0)
MCHC: 32.8 g/dL (ref 30.0–36.0)
MCV: 86.4 fL (ref 80.0–100.0)
Monocytes Absolute: 0.3 10*3/uL (ref 0.1–1.0)
Monocytes Relative: 11 %
Neutro Abs: 1.4 10*3/uL — ABNORMAL LOW (ref 1.7–7.7)
Neutrophils Relative %: 54 %
Platelet Count: 207 10*3/uL (ref 150–400)
RBC: 4.27 MIL/uL (ref 3.87–5.11)
RDW: 15.2 % (ref 11.5–15.5)
WBC Count: 2.6 10*3/uL — ABNORMAL LOW (ref 4.0–10.5)
nRBC: 0 % (ref 0.0–0.2)

## 2021-12-08 MED ORDER — INFLUENZA VAC A&B SA ADJ QUAD 0.5 ML IM PRSY
0.5000 mL | PREFILLED_SYRINGE | Freq: Once | INTRAMUSCULAR | Status: AC
  Administered 2021-12-08: 0.5 mL via INTRAMUSCULAR
  Filled 2021-12-08: qty 0.5

## 2021-12-08 NOTE — Therapy (Signed)
OUTPATIENT PHYSICAL THERAPY ONCOLOGY TREATMENT  Patient Name: Catherine [name] MRN: [id number] DOB:17-May-1955, 66 y.o., female Today's Date: 12/10/2021   PT End of Session - 12/10/21 1018     Visit Number 4    Number of Visits 13    Date for PT Re-Evaluation 12/23/21    PT Start Time 1015    Activity Tolerance Patient tolerated treatment well    Behavior During Therapy [facility] for tasks assessed/performed              Past Medical History:  Diagnosis Date   Anxiety    Cancer ([place])    right breast cancer   High cholesterol    History of kidney stones    Hypertension    Hypothyroidism    Past Surgical History:  Procedure Laterality Date   ABDOMINAL HYSTERECTOMY     AXILLARY SENTINEL NODE BIOPSY Right 03/02/2021   Procedure: RIGHT AXILLARY SENTINEL NODE BIOPSY;  Surgeon: Donnie Mesa, MD;  Location: [place];  Service: General;  Laterality: Right;   BREAST LUMPECTOMY WITH RADIOACTIVE SEED AND SENTINEL LYMPH NODE BIOPSY Right 03/02/2021   Procedure: RIGHT BREAST LUMPECTOMY WITH RADIOACTIVE SEED;  Surgeon: Donnie Mesa, MD;  Location: [place];  Service: General;  Laterality: Right;   KIDNEY SURGERY Right    NECK SURGERY     PORTACATH PLACEMENT N/A 03/31/2021   Procedure: INSERTION PORT-A-CATH;  Surgeon: Donnie Mesa, MD;  Location: [place];  Service: General;  Laterality: N/A;   RE-EXCISION OF BREAST LUMPECTOMY Right 03/31/2021   Procedure: RE-EXCISION OF RIGHT BREAST LUMPECTOMY MARGINS;  Surgeon: Donnie Mesa, MD;  Location: [place];  Service: General;  Laterality: Right;   WISDOM TOOTH EXTRACTION     Patient Active Problem List   Diagnosis Date Noted   Hypothyroidism 06/04/2021   Dyslipidemia 06/04/2021   Moderate recurrent major depression ([place]) 06/04/2021   Vitamin D deficiency 06/04/2021   Other specified disorders of bone density and structure, other site 06/04/2021   Malignant neoplasm of lower-outer quadrant of right breast of female, estrogen receptor positive ([place])  02/08/2021    PCP: Nicholas Lose, MD  REFERRING PROVIDER: Nicholas Lose, MD  REFERRING DIAG: right breast cancer  THERAPY DIAG:  Aftercare following surgery for neoplasm  Stiffness of right shoulder, not elsewhere classified  Abnormal posture  Muscle weakness (generalized)  ONSET DATE: June   Rationale for Evaluation and Treatment: Rehabilitation  SUBJECTIVE:                                                                                                                                                                                           SUBJECTIVE STATEMENT: Pain  is about a 5 in my shoulder and in my feet because of the neuropathy.    PERTINENT HISTORY:  HX of Malignant neoplasm of lower-outer quadrant of right breast of female, estrogen receptor positive ([place]) treated lumpectomy with 1/4 positive lymph nodes on 03/02/21 with chemo and radiation completed.   PAIN:  Are you having pain? Yes NPRS scale: 5/10 Pain location: right shoulder Pain orientation: Right  PAIN TYPE: aching and dull Pain description: constant   PRECAUTIONS: Rt UE lymphedema risk - needs to progress any UE strenthening slowly  WEIGHT BEARING RESTRICTIONS: No  FALLS:  Has patient fallen in last 6 months? No  LIVING ENVIRONMENT: Lives with: lives with their family and lives with their spouse Lives in: House/apartment   OCCUPATION: retired   LEISURE:   HAND DOMINANCE: right   PRIOR LEVEL OF FUNCTION: Independent  PATIENT GOALS: help with numbness and tingling in feet    OBJECTIVE:  COGNITION: Overall cognitive status: Within functional limits for tasks assessed   PALPATION:   OBSERVATIONS / OTHER ASSESSMENTS: Mild swelling in ankles and feet   SENSATION: Light touch: Deficits Right 2 sites felt  / left 5 sites felt   POSTURE: WFL  UPPER EXTREMITY AROM/PROM:  A/PROM RIGHT   eval  11/18/2021  Shoulder extension  45  Shoulder flexion  89 painful   Shoulder abduction   82 painful   Shoulder internal rotation  30  Shoulder external rotation  60    (Blank rows = not tested)  LOWER EXTREMITYMMT:  A/PROM Right eval  Hip flexion 4/5  Hip extension   Hip abduction   Hip adduction   Hip internal rotation 3/5  Hip external rotation 4/5  Knee flexion   Knee extension   Ankle dorsiflexion 3/5  Ankle plantarflexion   Ankle inversion   Ankle eversion    (Blank rows = not tested)  A/PROM LEFT eval  Hip flexion 4/5  Hip extension   Hip abduction   Hip adduction   Hip internal rotation 4/5  Hip external rotation 4/5  Knee flexion   Knee extension   Ankle dorsiflexion 3/5  Ankle plantarflexion   Ankle inversion   Ankle eversion    (Blank rows = not tested)  LYMPHEDEMA ASSESSMENTS:   SURGERY TYPE/DATE: 02/10/2021; Right breast lumpectomy and right axillary sentinel Lymph Node biopsy NUMBER OF LYMPH NODES REMOVED: 4 CHEMOTHERAPY: yes RADIATION:yes HORMONE TREATMENT: yes  FUNCTIONAL TESTS:  30 seconds chair stand test- 7  6 minute walk test: 1202 4/10 RPE scale difficulty rating Berg Balance Scale: 41 Grip Strength: R:  20 pounds     L: 45 pounds   GAIT: Distance walked: 1202 feet    TODAY'S TREATMENT:                                                                                                                                         DATE:  12/10/21 NuStep L5 x26mns  Rows and ext redTB 2x10  STS on airex 2x10 Step ups on airex Side steps on airex Shoulder flexion standing on airex 2# 2x10 Leg ext 5# AW 2x10 HS curls 20# 2x10 Walking on beam    12/01/21 UBE L1 x630ms  Wall slides flexion and abd x10 each  Rows and ext yellowTB 2x10 PROM all directions  Supine shoulder flexion 2x10 1#  Walking on beam forwards and side steps  Side steps over obstacles- CGA STS on airex 2x10  NuStep L5 x6m21m    11/18/2021: Exercise: Shoulder assessment Shoulder flex AAROM in supine  Shoulder squeeze  Shoulder roll   NeuroReed -  balance walking looking side to side  - balance walking looking up and down  - obstacle course walking over hurdles - stepping over obstacles side ways  - single leg stance on foam 2 x 30 secs  -Sit to stand with foam under one foot bil -towel scrunches   11/04/2021: EVAL  PATIENT EDUCATION:  Education details: POC Person educated: Patient Education method: Explanation Education comprehension: verbalized understanding  HOME EXERCISE PROGRAM: Towel scrunches with toes Scapular retraction Posterior shoulder rolls AAROM flexion seated and supine using opposite UE.    ASSESSMENT: CLINICAL IMPRESSION: Patient presented today with shoulder pain and discomfort. We worked on some shoulder strengthening but patient wants to work on her balance today. Tried to incorporate both in session today. Some unsteadiness with exercises on airex.    OBJECTIVE IMPAIRMENTS: decreased balance, decreased mobility, difficulty walking, decreased strength, and impaired sensation.   ACTIVITY LIMITATIONS: carrying, standing, and stairs  PARTICIPATION LIMITATIONS: driving and community activity  PERSONAL FACTORS: 1-2 comorbidities: right breast cancer   are also affecting patient's functional outcome.   REHAB POTENTIAL: Good  CLINICAL DECISION MAKING: Evolving/moderate complexity  EVALUATION COMPLEXITY: Moderate  GOALS: Goals reviewed with patient? Yes  LONG TERM GOALS: Target date: 12/23/2021    Pt will be independent with advanced HEP to maintain improvements made throughout therapy Baseline: Goal status: INITIAL  2.  Patient will increase 30 second sit to stands to 12 to show an increase in functional strength  Baseline: 7 Goal status: INITIAL  3.  Patient will increase berg balance score from a 41 to a 50. Baseline: 41 Goal status: INITIAL  4.  Patient will increase all LE strength to at least a 4/5 or greater Baseline:  Goal status: INITIAL   PLAN:  PT FREQUENCY: 2x/week  PT  DURATION: 6 weeks  PLANNED INTERVENTIONS: Therapeutic exercises, Therapeutic activity, Neuromuscular re-education, Balance training, Gait training, Patient/Family education, Self Care, Joint mobilization, Dry Needling, Cryotherapy, Moist heat, Manual lymph drainage, Compression bandaging, scar mobilization, Taping, Biofeedback, and Manual therapy  PLAN FOR NEXT SESSION: Passive shoulder ROM, STM to deltoids, pecs,and lats, scapular mobilization. Gait and balance work neuropathy. Progress any strengthening for R shoulder    Tamia Cofield, Student-PT 12/10/2021  10:58 AM

## 2021-12-08 NOTE — Telephone Encounter (Signed)
Faxed medical records request to pt's PCP, [facility] [phone]. Fax confirmation received.

## 2021-12-09 ENCOUNTER — Encounter: Payer: Medicare PPO | Admitting: Adult Health

## 2021-12-09 NOTE — Progress Notes (Signed)
SURVIVORSHIP VISIT:   BRIEF ONCOLOGIC HISTORY:  Oncology History  Malignant neoplasm of lower-outer quadrant of right breast of female, estrogen receptor positive ([place])  01/29/2021 Initial Diagnosis   Screening mammogram detected right breast mass 1.2 cm, axilla normal, biopsy revealed grade 2 IDC with DCIS ER 90%, PR 30%, HER2 2+ by IHC FISH negative   02/08/2021 Cancer Staging   Staging form: Breast, AJCC 8th Edition - Clinical stage from 02/08/2021: Stage IA (cT1c, cN0, cM0, G2, ER+, PR+, HER2: Equivocal) - Signed by Nicholas Lose, MD on 02/08/2021 Stage prefix: Initial diagnosis Histologic grading system: 3 grade system   03/02/2021 Surgery   Right lumpectomy: Grade 2 IDC 1.2 cm with intermediate grade DCIS, focal involvement of anterior medial margin junction, 1/4 lymph nodes positive   03/19/2021 Oncotype testing   Oncotype DX score: 28.  Distant recurrence at 10 years: 17%   03/24/2021 Cancer Staging   Staging form: Breast, AJCC 8th Edition - Pathologic: Stage IA (pT1c, pN1(sn), cM0, G2, ER+, PR+, HER2-, Oncotype DX score: 28) - Signed by Nicholas Lose, MD on 03/24/2021 Method of lymph node assessment: Sentinel lymph node biopsy Multigene prognostic tests performed: Oncotype DX Recurrence score range: Greater than or equal to 11 Histologic grading system: 3 grade system   04/23/2021 - 06/28/2021 Chemotherapy   Patient is on Treatment Plan : BREAST TC q21d     07/28/2021 - 09/13/2021 Radiation Therapy   Site Technique Total Dose (Gy) Dose per Fx (Gy) Completed Fx Beam Energies  Breast, Right: Breast_R 3D 50.4/50.4 1.8 28/28 6X, 10X  Breast, Right: Breast_R_SCLV 3D 50.4/50.4 1.8 28/28 6X, 10X  Breast, Right: Breast_R_Bst 3D 10/10 2 5/5 6X, 10X     09/2021 -  Anti-estrogen oral therapy   Anastrozole x 7 years     INTERVAL HISTORY:  Catherine [name] to review her survivorship care plan detailing her treatment course for breast cancer, as well as monitoring long-term side effects of that  treatment, education regarding health maintenance, screening, and overall wellness and health promotion.     Overall, Catherine [name] reports feeling quite well.  She feels weak in her right arm and has some pain that shoots through her right breast.  She also struggles with peripheral neuropathy.  She is seeing physical therapy and is ordered to work with them on her arm and her neuropathy.   She is taking anastrozole daily.  She tolerates this well.     REVIEW OF SYSTEMS:  Review of Systems  Constitutional:  Negative for appetite change, chills, fatigue, fever and unexpected weight change.  HENT:   Negative for hearing loss, lump/mass and trouble swallowing.   Eyes:  Negative for eye problems and icterus.  Respiratory:  Negative for chest tightness, cough and shortness of breath.   Cardiovascular:  Negative for chest pain, leg swelling and palpitations.  Gastrointestinal:  Negative for abdominal distention, abdominal pain, constipation, diarrhea, nausea and vomiting.  Endocrine: Negative for hot flashes.  Genitourinary:  Negative for difficulty urinating.   Musculoskeletal:  Negative for arthralgias.  Skin:  Negative for itching and rash.  Neurological:  Positive for numbness. Negative for dizziness, extremity weakness and headaches.  Hematological:  Negative for adenopathy. Does not bruise/bleed easily.  Psychiatric/Behavioral:  Negative for depression. The patient is not nervous/anxious.    Breast: Denies any new nodularity, masses, tenderness, nipple changes, or nipple discharge.     PAST MEDICAL/SURGICAL HISTORY:  Past Medical History:  Diagnosis Date   Anxiety    Cancer ([place])  right breast cancer   High cholesterol    History of kidney stones    Hypertension    Hypothyroidism    Past Surgical History:  Procedure Laterality Date   ABDOMINAL HYSTERECTOMY     AXILLARY SENTINEL NODE BIOPSY Right 03/02/2021   Procedure: RIGHT AXILLARY SENTINEL NODE BIOPSY;  Surgeon: Donnie Mesa, MD;  Location: [facility];  Service: General;  Laterality: Right;   BREAST LUMPECTOMY WITH RADIOACTIVE SEED AND SENTINEL LYMPH NODE BIOPSY Right 03/02/2021   Procedure: RIGHT BREAST LUMPECTOMY WITH RADIOACTIVE SEED;  Surgeon: Donnie Mesa, MD;  Location: [facility];  Service: General;  Laterality: Right;   KIDNEY SURGERY Right    NECK SURGERY     PORTACATH PLACEMENT N/A 03/31/2021   Procedure: INSERTION PORT-A-CATH;  Surgeon: Donnie Mesa, MD;  Location: [facility];  Service: General;  Laterality: N/A;   RE-EXCISION OF BREAST LUMPECTOMY Right 03/31/2021   Procedure: RE-EXCISION OF RIGHT BREAST LUMPECTOMY MARGINS;  Surgeon: Donnie Mesa, MD;  Location: [facility];  Service: General;  Laterality: Right;   WISDOM TOOTH EXTRACTION       ALLERGIES:  Allergies  Allergen Reactions   Penicillins Hives     CURRENT MEDICATIONS:  Outpatient Encounter Medications as of 12/08/2021  Medication Sig   acetaminophen (TYLENOL) 500 MG tablet Take 1,000 mg by mouth every 6 (six) hours as needed for moderate pain.   anastrozole (ARIMIDEX) 1 MG tablet Take 1 tablet (1 mg total) by mouth daily.   Calcium Magnesium Zinc 333-133-5 MG TABS Take 1 tablet by mouth daily. (Patient taking differently: Take 1 tablet by mouth once a week.)   diphenhydrAMINE (BENADRYL) 25 MG tablet Take 25 mg by mouth every 6 (six) hours as needed for allergies.   ergocalciferol (VITAMIN D2) 1.25 MG (50000 UT) capsule Take 1 capsule (50,000 Units total) by mouth once a week.   escitalopram (LEXAPRO) 5 MG tablet Take 5 mg by mouth once a week.   gabapentin (NEURONTIN) 300 MG capsule Take 1 capsule (300 mg total) by mouth at bedtime.   levothyroxine (SYNTHROID) 50 MCG tablet Take 1 tablet (50 mcg total) by mouth daily before breakfast.   metoprolol tartrate (LOPRESSOR) 25 MG tablet Take 12.5 mg by mouth 2 (two) times daily.   oxyCODONE (OXY IR/ROXICODONE) 5 MG immediate release tablet Take by mouth.   rosuvastatin (CRESTOR) 5 MG tablet Take 1  tablet (5 mg total) by mouth daily.   diphenoxylate-atropine (LOMOTIL) 2.5-0.025 MG tablet Take 1 tablet by mouth 4 (four) times daily as needed for diarrhea or loose stools. (Patient not taking: Reported on 12/08/2021)   Facility-Administered Encounter Medications as of 12/08/2021  Medication   ondansetron (ZOFRAN) 4 MG/2ML injection   ondansetron (ZOFRAN) 4 MG/2ML injection   ondansetron (ZOFRAN) injection 8 mg     ONCOLOGIC FAMILY HISTORY:  History reviewed. No pertinent family history.   SOCIAL HISTORY:  Social History   Socioeconomic History   Marital status: Married    Spouse name: Not on file   Number of children: Not on file   Years of education: Not on file   Highest education level: Not on file  Occupational History   Not on file  Tobacco Use   Smoking status: Never   Smokeless tobacco: Never  Vaping Use   Vaping Use: Never used  Substance and Sexual Activity   Alcohol use: No   Drug use: No   Sexual activity: Yes    Birth control/protection: Surgical  Other Topics Concern   Not  on file  Social History Narrative   Not on file   Social Determinants of Health   Financial Resource Strain: Not on file  Food Insecurity: Not on file  Transportation Needs: Not on file  Physical Activity: Not on file  Stress: Not on file  Social Connections: Not on file  Intimate Partner Violence: Not on file     OBSERVATIONS/OBJECTIVE:  BP 137/73 (BP Location: Left Arm, Patient Position: Sitting)   Pulse 73   Temp 98.4 F (36.9 C) (Temporal)   Resp 16   Ht _0  (1.6 m)   Wt 161 lb 1.6 oz (73.1 kg)   SpO2 100%   BMI 28.54 kg/m  GENERAL: Patient is a well appearing female in no acute distress HEENT:  Sclerae anicteric.  Oropharynx clear and moist. No ulcerations or evidence of oropharyngeal candidiasis. Neck is supple.  NODES:  No cervical, supraclavicular, or axillary lymphadenopathy palpated.  BREAST EXAM:  right breast s/p lumpectomy and radiation, slight  tenderness in upper breast but no sign of recurrence, left breast is benign. LUNGS:  Clear to auscultation bilaterally.  No wheezes or rhonchi. HEART:  Regular rate and rhythm. No murmur appreciated. ABDOMEN:  Soft, nontender.  Positive, normoactive bowel sounds. No organomegaly palpated. MSK:  No focal spinal tenderness to palpation. Full range of motion bilaterally in the upper extremities. EXTREMITIES:  No peripheral edema.   SKIN:  Clear with no obvious rashes or skin changes. No nail dyscrasia. NEURO:  Nonfocal. Well oriented.  Appropriate affect.   LABORATORY DATA:  None for this visit.  DIAGNOSTIC IMAGING:  None for this visit.      ASSESSMENT AND PLAN:  Ms.. [name] is a pleasant 66 y.o. female with Stage IA right breast invasive ductal carcinoma, ER+/PR+/HER2-, diagnosed in 02/2021, treated with lumpectomy, adjuvant chemotherapy,  adjuvant radiation therapy, and anti-estrogen therapy with Anastrozole beginning in 09/2021.  She presents to the Survivorship Clinic for our initial meeting and routine follow-up post-completion of treatment for breast cancer.    1. Stage IA right breast cancer:  Catherine [name] is continuing to recover from definitive treatment for breast cancer. She will follow-up with her medical oncologist, Dr. Lindi Adie in 6 months with history and physical exam per surveillance protocol.  She will continue her anti-estrogen therapy with Anastrozole. Thus far, she is tolerating the Anastrozole well, with minimal side effects. She was instructed to make Dr. Lindi Adie or myself aware if she begins to experience any worsening side effects of the medication and I could see her back in clinic to help manage those side effects, as needed. Her mammogram is due 02/2022; orders placed today. Today, a comprehensive survivorship care plan and treatment summary was reviewed with the patient today detailing her breast cancer diagnosis, treatment course, potential late/long-term effects of  treatment, appropriate follow-up care with recommendations for the future, and patient education resources.  A copy of this summary, along with a letter will be sent to the patient's primary care provider via mail/fax/In Basket message after today's visit.    2. Lower back pain: We will get xrays to evaluate this further. I discussed yoga as a method to alleviate this discomfort.    3. Breast swelling/tenderness and peripheral neuropathy: I sent our PT director Leone Payor a question about the facility where Catherine [name] is undergoing her PT and whether she can get additional treatment for her neuropathy at there new PT location or not.    4. Bone health:  Given Catherine [name]'s age/history of breast  cancer and her current treatment regimen including anti-estrogen therapy with Anastrozole, she is at risk for bone demineralization.  She underwent bone density testing on 10/06/2021 that showed osteopenia with a t score of -2.2.  We will monitor this every 2 years.  She was given education on specific activities to promote bone health.  5. Cancer screening:  Due to Catherine [name]'s history and her age, she should receive screening for skin cancers, colon cancer, and gynecologic cancers.  Her health maintenance information is not updated in her record since she has been in [place] and at [facility].  I will review care everywhere and we sent a records request to [facility] to obtain her records.   6. Health maintenance and wellness promotion: Catherine [name] was encouraged to consume 5-7 servings of fruits and vegetables per day. We reviewed the "Nutrition Rainbow" handout.  She was also encouraged to engage in moderate to vigorous exercise for 30 minutes per day most days of the week. We discussed the [employer] fitness program, which is designed for cancer survivors to help them become more physically fit after cancer treatments.  She was instructed to limit her alcohol consumption and continue to abstain from  tobacco [place].     7. Support services/counseling: It is not uncommon for this period of the patient's cancer care trajectory to be one of many emotions and stressors. She was given information regarding our available services and encouraged to contact me with any questions or for help enrolling in any of our support group/programs.    Follow up instructions:    -Return to cancer center in 6 months for f/u with Dr. Lindi Adie  -Mammogram due in 02/2022 -Dexa 10/2023 -She is welcome to return back to the Survivorship Clinic at any time; no additional follow-up needed at this time.  -Consider referral back to survivorship as a long-term survivor for continued surveillance  The patient was provided an opportunity to ask questions and all were answered. The patient agreed with the plan and demonstrated an understanding of the instructions.   Total encounter time:60 minutes*in face-to-face visit time, chart review, lab review, care coordination, order entry, and documentation of the encounter time.    Wilber Bihari, NP 12/09/21 11:58 AM Medical Oncology and Hematology [facility] [place], [place] [address] Tel. [phone]    Fax. [phone]  *Total Encounter Time as defined by the Centers for Medicare and Medicaid Services includes, in addition to the face-to-face time of a patient visit (documented in the note above) non-face-to-face time: obtaining and reviewing outside history, ordering and reviewing medications, tests or procedures, care coordination (communications with other health care professionals or caregivers) and documentation in the medical record.

## 2021-12-10 ENCOUNTER — Telehealth: Payer: Self-pay | Admitting: *Deleted

## 2021-12-10 ENCOUNTER — Ambulatory Visit: Payer: Medicare PPO | Attending: Hematology and Oncology

## 2021-12-10 DIAGNOSIS — R209 Unspecified disturbances of skin sensation: Secondary | ICD-10-CM | POA: Diagnosis not present

## 2021-12-10 DIAGNOSIS — Z483 Aftercare following surgery for neoplasm: Secondary | ICD-10-CM | POA: Insufficient documentation

## 2021-12-10 DIAGNOSIS — M25611 Stiffness of right shoulder, not elsewhere classified: Secondary | ICD-10-CM | POA: Diagnosis not present

## 2021-12-10 DIAGNOSIS — R293 Abnormal posture: Secondary | ICD-10-CM | POA: Diagnosis not present

## 2021-12-10 DIAGNOSIS — M6281 Muscle weakness (generalized): Secondary | ICD-10-CM | POA: Insufficient documentation

## 2021-12-10 NOTE — Telephone Encounter (Signed)
-----   Message from Gardenia Phlegm, NP sent at 12/10/2021 10:26 AM EST ----- Xrays show degeneration in the spine.  I recommend that she do some yoga, consider taking aleve BID, and if not better we can get her in with PT or sports medicine.  Thanks, [place] ----- Message ----- From: Interface, Rad Results In Sent: 12/09/2021   2:41 PM EST To: Gardenia Phlegm, NP

## 2021-12-10 NOTE — Telephone Encounter (Signed)
Per Wilber Bihari, NP, called pt with message below. Pt verbalized understanding.

## 2021-12-13 ENCOUNTER — Telehealth: Payer: Self-pay | Admitting: Adult Health

## 2021-12-13 NOTE — Telephone Encounter (Signed)
Scheduled appointment per 12/6 los. Patient is aware.

## 2021-12-14 ENCOUNTER — Telehealth: Payer: Self-pay | Admitting: Family Medicine

## 2021-12-14 NOTE — Telephone Encounter (Signed)
-----   Message from Conception Oms sent at 12/13/2021 11:42 AM EST ----- Regarding: NP for Catherine [name] Would you please call this pt and set her up on either 1/16 at 4 pm or 1/30 at 320 pm? Those are the only two options for January.  Thank you! ----- Message ----- From: Mosie Lukes, MD Sent: 12/08/2021   4:59 PM EST To: Conception Oms; Gerilyn Nestle, RN  Can we get the patient in in the next couple of months. She has never asked before.  ----- Message ----- From: Gardenia Phlegm, NP Sent: 12/08/2021  10:16 AM EST To: Mosie Lukes, MD  Hey there,   This patient is in need of a PCP and wants to see you.  She is coming from [place] and is dissatisfied and you are closer to where she lives.  Can you get her in?   She has hypertension, hypothyroidism, and elevated cholesterol.  I am going to update her HM in EPIC from her records so she will be teed up for a new patient visit.    She will need to be seen in January (fingers crossed).   Thanks,   Mendel Ryder

## 2021-12-14 NOTE — Telephone Encounter (Signed)
Called patient and offered the two appts  Patient would like a morning appt & asked for February.(Times that work for her are 930-10)  Please advise

## 2021-12-15 ENCOUNTER — Telehealth: Payer: Self-pay

## 2021-12-15 ENCOUNTER — Ambulatory Visit: Payer: Medicare PPO | Admitting: Physical Therapy

## 2021-12-15 NOTE — Telephone Encounter (Signed)
Placed call to [facility] to obtain health maintenance hx. LVM for medical records to fax records to [place].

## 2021-12-17 ENCOUNTER — Encounter: Payer: Self-pay | Admitting: Physical Therapy

## 2021-12-17 ENCOUNTER — Ambulatory Visit: Payer: Medicare PPO | Admitting: Physical Therapy

## 2021-12-17 DIAGNOSIS — R293 Abnormal posture: Secondary | ICD-10-CM

## 2021-12-17 DIAGNOSIS — M6281 Muscle weakness (generalized): Secondary | ICD-10-CM | POA: Diagnosis not present

## 2021-12-17 DIAGNOSIS — Z483 Aftercare following surgery for neoplasm: Secondary | ICD-10-CM

## 2021-12-17 DIAGNOSIS — M25611 Stiffness of right shoulder, not elsewhere classified: Secondary | ICD-10-CM | POA: Diagnosis not present

## 2021-12-17 DIAGNOSIS — R209 Unspecified disturbances of skin sensation: Secondary | ICD-10-CM

## 2021-12-17 NOTE — Therapy (Signed)
OUTPATIENT PHYSICAL THERAPY ONCOLOGY TREATMENT  Patient Name: Catherine [name] MRN: [id number] DOB:November 22, 1955, 66 y.o., female Today's Date: 12/17/2021   PT End of Session - 12/17/21 1018     Visit Number 5    Date for PT Re-Evaluation 12/23/21    PT Start Time 1016    PT Stop Time 1055    PT Time Calculation (min) 39 min    Activity Tolerance Patient tolerated treatment well    Behavior During Therapy WFL for tasks assessed/performed               Past Medical History:  Diagnosis Date   Anxiety    Cancer ([place])    right breast cancer   High cholesterol    History of kidney stones    Hypertension    Hypothyroidism    Past Surgical History:  Procedure Laterality Date   ABDOMINAL HYSTERECTOMY     AXILLARY SENTINEL NODE BIOPSY Right 03/02/2021   Procedure: RIGHT AXILLARY SENTINEL NODE BIOPSY;  Surgeon: Donnie Mesa, MD;  Location: [facility];  Service: General;  Laterality: Right;   BREAST LUMPECTOMY WITH RADIOACTIVE SEED AND SENTINEL LYMPH NODE BIOPSY Right 03/02/2021   Procedure: RIGHT BREAST LUMPECTOMY WITH RADIOACTIVE SEED;  Surgeon: Donnie Mesa, MD;  Location: [facility] [facility];  Service: General;  Laterality: Right;   KIDNEY SURGERY Right    NECK SURGERY     PORTACATH PLACEMENT N/A 03/31/2021   Procedure: INSERTION PORT-A-CATH;  Surgeon: Donnie Mesa, MD;  Location: [facility];  Service: General;  Laterality: N/A;   RE-EXCISION OF BREAST LUMPECTOMY Right 03/31/2021   Procedure: RE-EXCISION OF RIGHT BREAST LUMPECTOMY MARGINS;  Surgeon: Donnie Mesa, MD;  Location: [facility];  Service: General;  Laterality: Right;   WISDOM TOOTH EXTRACTION     Patient Active Problem List   Diagnosis Date Noted   Hypothyroidism 06/04/2021   Dyslipidemia 06/04/2021   Moderate recurrent major depression ([place]) 06/04/2021   Vitamin D deficiency 06/04/2021   Other specified disorders of bone density and structure, other site 06/04/2021   Malignant neoplasm of lower-outer quadrant of right breast of female,  estrogen receptor positive ([place]) 02/08/2021    PCP: Nicholas Lose, MD  REFERRING PROVIDER: Nicholas Lose, MD  REFERRING DIAG: right breast cancer  THERAPY DIAG:  Aftercare following surgery for neoplasm  Stiffness of right shoulder, not elsewhere classified  Abnormal posture  Unspecified disturbances of skin sensation  Muscle weakness (generalized)  ONSET DATE: June   Rationale for Evaluation and Treatment: Rehabilitation  SUBJECTIVE:  SUBJECTIVE STATEMENT: Pain is about a 4 in feet and R shoulder  PERTINENT HISTORY: HX of Malignant neoplasm of lower-outer quadrant of right breast of female, estrogen receptor positive ([place]) treated lumpectomy with 1/4 positive lymph nodes on 03/02/21 with chemo and radiation completed.   PAIN:  Are you having pain? Yes NPRS scale: 5/10 Pain location: right shoulder Pain orientation: Right  PAIN TYPE: aching and dull Pain description: constant   PRECAUTIONS: Rt UE lymphedema risk - needs to progress any UE strenthening slowly  WEIGHT BEARING RESTRICTIONS: No  FALLS:  Has patient fallen in last 6 months? No  LIVING ENVIRONMENT: Lives with: lives with their family and lives with their spouse Lives in: House/apartment   OCCUPATION: retired   LEISURE:   HAND DOMINANCE: right   PRIOR LEVEL OF FUNCTION: Independent  PATIENT GOALS: help with numbness and tingling in feet    OBJECTIVE:  COGNITION: Overall cognitive status: Within functional limits for tasks assessed   PALPATION:   OBSERVATIONS / OTHER ASSESSMENTS: Mild swelling in ankles and feet   SENSATION: Light touch: Deficits Right 2 sites felt  / left 5 sites felt   POSTURE: [facility]  UPPER EXTREMITY AROM/PROM:  A/PROM RIGHT   eval  11/18/2021  Shoulder extension  45  Shoulder  flexion  89 painful   Shoulder abduction  82 painful   Shoulder internal rotation  30  Shoulder external rotation  60    (Blank rows = not tested)  LOWER EXTREMITYMMT:  A/PROM Right eval  Hip flexion 4/5  Hip extension   Hip abduction   Hip adduction   Hip internal rotation 3/5  Hip external rotation 4/5  Knee flexion   Knee extension   Ankle dorsiflexion 3/5  Ankle plantarflexion   Ankle inversion   Ankle eversion    (Blank rows = not tested)  A/PROM LEFT eval  Hip flexion 4/5  Hip extension   Hip abduction   Hip adduction   Hip internal rotation 4/5  Hip external rotation 4/5  Knee flexion   Knee extension   Ankle dorsiflexion 3/5  Ankle plantarflexion   Ankle inversion   Ankle eversion    (Blank rows = not tested)  LYMPHEDEMA ASSESSMENTS:   SURGERY TYPE/DATE: 02/10/2021; Right breast lumpectomy and right axillary sentinel Lymph Node biopsy NUMBER OF LYMPH NODES REMOVED: 4 CHEMOTHERAPY: yes RADIATION:yes HORMONE TREATMENT: yes  FUNCTIONAL TESTS:  30 seconds chair stand test- 7  6 minute walk test: 1202 4/10 RPE scale difficulty rating Berg Balance Scale: 41 Grip Strength: R:  20 pounds     L: 45 pounds   GAIT: Distance walked: 1202 feet    TODAY'S TREATMENT:                                                                                                                                         DATE:  12/19/21 UBE L1 x 3  min forward/3 min back Balance training, SLS-alternately tapping 4 " step, progressed to multiple taps, then tapping with rotation, no issues Airex, slow march, no issues Quick steps-box step in each direction, then moving upon therapsit command to change directions quickly, no unsteadiness noted. Initiated R shoulder activities, found that she was severely sore and painful. Further assessment of R shoulder-severely tight and painful R pects, supraspinatus TTP. Sh horiz abd only to neutral, shoulder abd < 90 degrees due to pain Provided  STM to R pects with stretch and cross friction, measured due to severe pain. Finished with CP to R ant chest and MH to neck asnd shoulders for up traps tightness and pain  12/10/21 NuStep L5 x58mns  Rows and ext redTB 2x10  STS on airex 2x10 Step ups on airex Side steps on airex Shoulder flexion standing on airex 2# 2x10 Leg ext 5# AW 2x10 HS curls 20# 2x10 Walking on beam    12/01/21 UBE L1 x68ms  Wall slides flexion and abd x10 each  Rows and ext yellowTB 2x10 PROM all directions  Supine shoulder flexion 2x10 1#  Walking on beam forwards and side steps  Side steps over obstacles- CGA STS on airex 2x10  NuStep L5 x6m62m    11/18/2021: Exercise: Shoulder assessment Shoulder flex AAROM in supine  Shoulder squeeze  Shoulder roll   NeuroReed - balance walking looking side to side  - balance walking looking up and down  - obstacle course walking over hurdles - stepping over obstacles side ways  - single leg stance on foam 2 x 30 secs  -Sit to stand with foam under one foot bil -towel scrunches   11/04/2021: EVAL  PATIENT EDUCATION:  Education details: POC Person educated: Patient Education method: Explanation Education comprehension: verbalized understanding  HOME EXERCISE PROGRAM: Towel scrunches with toes Scapular retraction Posterior shoulder rolls AAROM flexion seated and supine using opposite UE.    ASSESSMENT: CLINICAL IMPRESSION: Patient's R shoulder pain severe today, with severe tightness noted in R pects. Initiated STM, she needs a lot more of this to decrease spasm in R ant chest. May be a candidate for DN, [place].   OBJECTIVE IMPAIRMENTS: decreased balance, decreased mobility, difficulty walking, decreased strength, and impaired sensation.   ACTIVITY LIMITATIONS: carrying, standing, and stairs  PARTICIPATION LIMITATIONS: driving and community activity  PERSONAL FACTORS: 1-2 comorbidities: right breast cancer   are also affecting patient's  functional outcome.   REHAB POTENTIAL: Good  CLINICAL DECISION MAKING: Evolving/moderate complexity  EVALUATION COMPLEXITY: Moderate  GOALS: Goals reviewed with patient? Yes  LONG TERM GOALS: Target date: 12/23/2021    Pt will be independent with advanced HEP to maintain improvements made throughout therapy Baseline: Goal status: INITIAL  2.  Patient will increase 30 second sit to stands to 12 to show an increase in functional strength  Baseline: 7 Goal status: INITIAL  3.  Patient will increase berg balance score from a 41 to a 50. Baseline: 41 Goal status: INITIAL  4.  Patient will increase all LE strength to at least a 4/5 or greater Baseline:  Goal status: ongoing  PLAN:  PT FREQUENCY: 2x/week  PT DURATION: 6 weeks  PLANNED INTERVENTIONS: Therapeutic exercises, Therapeutic activity, Neuromuscular re-education, Balance training, Gait training, Patient/Family education, Self Care, Joint mobilization, Dry Needling, Cryotherapy, Moist heat, Manual lymph drainage, Compression bandaging, scar mobilization, Taping, Biofeedback, and Manual therapy  PLAN FOR NEXT SESSION: STM to R pect and ant shoulder, stretch, Need to re establish approriate scapulo humeral rhythm.Acute pain  modalities for R shoulder pain, strengthening.   Ethel Rana DPT 12/17/21 11:11 AM  12/17/2021  11:11 AM

## 2021-12-21 ENCOUNTER — Telehealth: Payer: Self-pay

## 2021-12-21 ENCOUNTER — Ambulatory Visit: Payer: Medicare PPO

## 2021-12-21 DIAGNOSIS — Z483 Aftercare following surgery for neoplasm: Secondary | ICD-10-CM | POA: Diagnosis not present

## 2021-12-21 DIAGNOSIS — R209 Unspecified disturbances of skin sensation: Secondary | ICD-10-CM

## 2021-12-21 DIAGNOSIS — M25611 Stiffness of right shoulder, not elsewhere classified: Secondary | ICD-10-CM

## 2021-12-21 DIAGNOSIS — R293 Abnormal posture: Secondary | ICD-10-CM | POA: Diagnosis not present

## 2021-12-21 DIAGNOSIS — M6281 Muscle weakness (generalized): Secondary | ICD-10-CM | POA: Diagnosis not present

## 2021-12-21 NOTE — Telephone Encounter (Signed)
Called Pt per Wilber Bihari, NP requesting PCP/missing vaccine information. Pt states her PCP is Dr. Harlan Stains from [place]. Pt cannot recall any vaccines she has received other than flu/covid. Pt stated she used to see Dr. Neale Burly at [facility] in [place], [place]. Told Pt we would call back with further questions.   Faxed request for records to [facility] at [phone]. Received fax confirmation.

## 2021-12-21 NOTE — Therapy (Signed)
OUTPATIENT PHYSICAL THERAPY ONCOLOGY TREATMENT  Patient Name: Catherine [name] MRN: [id number] DOB:31-Dec-1955, 66 y.o., female Today's Date: 12/21/2021   PT End of Session - 12/21/21 1056     Visit Number 6    Date for PT Re-Evaluation 02/01/22    PT Start Time 1100    PT Stop Time 1147    PT Time Calculation (min) 47 min    Activity Tolerance Patient tolerated treatment well    Behavior During Therapy WFL for tasks assessed/performed                Past Medical History:  Diagnosis Date   Anxiety    Cancer ([place])    right breast cancer   High cholesterol    History of kidney stones    Hypertension    Hypothyroidism    Past Surgical History:  Procedure Laterality Date   ABDOMINAL HYSTERECTOMY     AXILLARY SENTINEL NODE BIOPSY Right 03/02/2021   Procedure: RIGHT AXILLARY SENTINEL NODE BIOPSY;  Surgeon: Donnie Mesa, MD;  Location: [facility];  Service: General;  Laterality: Right;   BREAST LUMPECTOMY WITH RADIOACTIVE SEED AND SENTINEL LYMPH NODE BIOPSY Right 03/02/2021   Procedure: RIGHT BREAST LUMPECTOMY WITH RADIOACTIVE SEED;  Surgeon: Donnie Mesa, MD;  Location: [facility];  Service: General;  Laterality: Right;   KIDNEY SURGERY Right    NECK SURGERY     PORTACATH PLACEMENT N/A 03/31/2021   Procedure: INSERTION PORT-A-CATH;  Surgeon: Donnie Mesa, MD;  Location: [facility];  Service: General;  Laterality: N/A;   RE-EXCISION OF BREAST LUMPECTOMY Right 03/31/2021   Procedure: RE-EXCISION OF RIGHT BREAST LUMPECTOMY MARGINS;  Surgeon: Donnie Mesa, MD;  Location: [facility];  Service: General;  Laterality: Right;   WISDOM TOOTH EXTRACTION     Patient Active Problem List   Diagnosis Date Noted   Hypothyroidism 06/04/2021   Dyslipidemia 06/04/2021   Moderate recurrent major depression ([place]) 06/04/2021   Vitamin D deficiency 06/04/2021   Other specified disorders of bone density and structure, other site 06/04/2021   Malignant neoplasm of lower-outer quadrant of right breast of  female, estrogen receptor positive ([place]) 02/08/2021    PCP: Nicholas Lose, MD  REFERRING PROVIDER: Nicholas Lose, MD  REFERRING DIAG: right breast cancer  THERAPY DIAG:  Aftercare following surgery for neoplasm  Stiffness of right shoulder, not elsewhere classified  Abnormal posture  Muscle weakness (generalized)  Unspecified disturbances of skin sensation  ONSET DATE: June   Rationale for Evaluation and Treatment: Rehabilitation  SUBJECTIVE:  SUBJECTIVE STATEMENT: The shoulder is the same, it's about a 5.   PERTINENT HISTORY: HX of Malignant neoplasm of lower-outer quadrant of right breast of female, estrogen receptor positive ([place]) treated lumpectomy with 1/4 positive lymph nodes on 03/02/21 with chemo and radiation completed.   PAIN:  Are you having pain? Yes NPRS scale: 5/10 Pain location: right shoulder Pain orientation: Right  PAIN TYPE: aching and dull Pain description: constant   PRECAUTIONS: Rt UE lymphedema risk - needs to progress any UE strenthening slowly  WEIGHT BEARING RESTRICTIONS: No  FALLS:  Has patient fallen in last 6 months? No  LIVING ENVIRONMENT: Lives with: lives with their family and lives with their spouse Lives in: House/apartment   OCCUPATION: retired   LEISURE:   HAND DOMINANCE: right   PRIOR LEVEL OF FUNCTION: Independent  PATIENT GOALS: help with numbness and tingling in feet    OBJECTIVE:  COGNITION: Overall cognitive status: Within functional limits for tasks assessed   PALPATION:   OBSERVATIONS / OTHER ASSESSMENTS: Mild swelling in ankles and feet   SENSATION: Light touch: Deficits Right 2 sites felt  / left 5 sites felt   POSTURE: [facility]  UPPER EXTREMITY AROM/PROM:  A/PROM RIGHT   eval  11/18/2021 12/21/21  Shoulder  extension  45   Shoulder flexion  89 painful  90 w/pain  Shoulder abduction  82 painful  87 w/pain  Shoulder internal rotation  30 WFL  Shoulder external rotation  60 WFL    (Blank rows = not tested)  LOWER EXTREMITYMMT:  A/PROM Right eval  Hip flexion 4/5  Hip extension   Hip abduction   Hip adduction   Hip internal rotation 3/5  Hip external rotation 4/5  Knee flexion   Knee extension   Ankle dorsiflexion 3/5  Ankle plantarflexion   Ankle inversion   Ankle eversion    (Blank rows = not tested)  A/PROM LEFT eval  Hip flexion 4/5  Hip extension   Hip abduction   Hip adduction   Hip internal rotation 4/5  Hip external rotation 4/5  Knee flexion   Knee extension   Ankle dorsiflexion 3/5  Ankle plantarflexion   Ankle inversion   Ankle eversion    (Blank rows = not tested)  LYMPHEDEMA ASSESSMENTS:   SURGERY TYPE/DATE: 02/10/2021; Right breast lumpectomy and right axillary sentinel Lymph Node biopsy NUMBER OF LYMPH NODES REMOVED: 4 CHEMOTHERAPY: yes RADIATION:yes HORMONE TREATMENT: yes  FUNCTIONAL TESTS:  30 seconds chair stand test- 7  6 minute walk test: 1202 4/10 RPE scale difficulty rating Berg Balance Scale: 41 Grip Strength: R:  20 pounds     L: 45 pounds   GAIT: Distance walked: 1202 feet    TODAY'S TREATMENT:  DATE:  12/21/21 UBE L2 x73mns Shoulder ROM and BERG reassess Leg ext 5# 2x10 HS curls 20# 2x10 Calf stretch 30s  Rows and ext green 2x10 Horizontal abd red x5, x10 yellow CP to R ant chest and MH to neck asnd shoulders for up traps tightness and pain  12/19/21 UBE L1 x 3 min forward/3 min back Balance training, SLS-alternately tapping 4 " step, progressed to multiple taps, then tapping with rotation, no issues Airex, slow march, no issues Quick steps-box step in each direction, then moving upon  therapsit command to change directions quickly, no unsteadiness noted. Initiated R shoulder activities, found that she was severely sore and painful. Further assessment of R shoulder-severely tight and painful R pects, supraspinatus TTP. Sh horiz abd only to neutral, shoulder abd < 90 degrees due to pain Provided STM to R pects with stretch and cross friction, measured due to severe pain. Finished with CP to R ant chest and MH to neck asnd shoulders for up traps tightness and pain  12/10/21 NuStep L5 x69ms  Rows and ext redTB 2x10  STS on airex 2x10 Step ups on airex Side steps on airex Shoulder flexion standing on airex 2# 2x10 Leg ext 5# AW 2x10 HS curls 20# 2x10 Walking on beam    12/01/21 UBE L1 x6m50m  Wall slides flexion and abd x10 each  Rows and ext yellowTB 2x10 PROM all directions  Supine shoulder flexion 2x10 1#  Walking on beam forwards and side steps  Side steps over obstacles- CGA STS on airex 2x10  NuStep L5 x6mi25m   11/18/2021: Exercise: Shoulder assessment Shoulder flex AAROM in supine  Shoulder squeeze  Shoulder roll   NeuroReed - balance walking looking side to side  - balance walking looking up and down  - obstacle course walking over hurdles - stepping over obstacles side ways  - single leg stance on foam 2 x 30 secs  -Sit to stand with foam under one foot bil -towel scrunches   11/04/2021: EVAL  PATIENT EDUCATION:  Education details: POC Person educated: Patient Education method: Explanation Education comprehension: verbalized understanding  HOME EXERCISE PROGRAM: Towel scrunches with toes Scapular retraction Posterior shoulder rolls AAROM flexion seated and supine using opposite UE.    ASSESSMENT: CLINICAL IMPRESSION: Patient has made good progress towards her goals. Met her BERG goal. Still has limitations with R shoulder due to pain and decreased range. Added a goal for this. Continued to work on R shoulder and overall balance and  strength   OBJECTIVE IMPAIRMENTS: decreased balance, decreased mobility, difficulty walking, decreased strength, and impaired sensation.   ACTIVITY LIMITATIONS: carrying, standing, and stairs  PARTICIPATION LIMITATIONS: driving and community activity  PERSONAL FACTORS: 1-2 comorbidities: right breast cancer   are also affecting patient's functional outcome.   REHAB POTENTIAL: Good  CLINICAL DECISION MAKING: Evolving/moderate complexity  EVALUATION COMPLEXITY: Moderate  GOALS: Goals reviewed with patient? Yes  LONG TERM GOALS: Target date: 02/01/2022  Pt will be independent with advanced HEP to maintain improvements made throughout therapy Baseline: Goal status: INITIAL  2.  Patient will increase 30 second sit to stands to 12 to show an increase in functional strength  Baseline: 7, 9-12/19/23 Goal status: IN PROGRESS  3.  Patient will increase berg balance score from a 41 to a 50. Baseline: 41, 50/56-12/19/23 Goal status: MET  4.  Patient will increase all LE strength to at least a 4/5 or greater Baseline:  Goal status: ongoing  5. Patient will increase R shoulder  ROM to [facility]  Goal status: ongoing  PLAN:  PT FREQUENCY: 2x/week  PT DURATION: 6 weeks  PLANNED INTERVENTIONS: Therapeutic exercises, Therapeutic activity, Neuromuscular re-education, Balance training, Gait training, Patient/Family education, Self Care, Joint mobilization, Dry Needling, Cryotherapy, Moist heat, Manual lymph drainage, Compression bandaging, scar mobilization, Taping, Biofeedback, and Manual therapy  PLAN FOR NEXT SESSION: STM to R pect and ant shoulder, stretch, Need to re establish approriate scapulo humeral rhythm.Acute pain modalities for R shoulder pain, strengthening.   Andris Baumann, DPT 12/21/21 11:46 AM  12/21/2021  11:46 AM

## 2021-12-22 DIAGNOSIS — F419 Anxiety disorder, unspecified: Secondary | ICD-10-CM | POA: Diagnosis not present

## 2021-12-22 DIAGNOSIS — E785 Hyperlipidemia, unspecified: Secondary | ICD-10-CM | POA: Diagnosis not present

## 2021-12-22 DIAGNOSIS — E559 Vitamin D deficiency, unspecified: Secondary | ICD-10-CM | POA: Diagnosis not present

## 2021-12-22 DIAGNOSIS — E039 Hypothyroidism, unspecified: Secondary | ICD-10-CM | POA: Diagnosis not present

## 2021-12-22 DIAGNOSIS — I10 Essential (primary) hypertension: Secondary | ICD-10-CM | POA: Diagnosis not present

## 2021-12-22 DIAGNOSIS — F331 Major depressive disorder, recurrent, moderate: Secondary | ICD-10-CM | POA: Diagnosis not present

## 2021-12-22 DIAGNOSIS — G620 Drug-induced polyneuropathy: Secondary | ICD-10-CM | POA: Diagnosis not present

## 2021-12-22 DIAGNOSIS — C50911 Malignant neoplasm of unspecified site of right female breast: Secondary | ICD-10-CM | POA: Diagnosis not present

## 2021-12-23 ENCOUNTER — Ambulatory Visit: Payer: Medicare PPO

## 2021-12-23 NOTE — Progress Notes (Signed)
Called and lvm to [facility] [phone] requesting any existing records they have of Pt to be faxed to [place]. Gave call back number for further questions. Fax request was sent 12/19 with no response at this time.

## 2021-12-29 ENCOUNTER — Encounter: Payer: Self-pay | Admitting: Hematology and Oncology

## 2022-01-06 ENCOUNTER — Encounter: Payer: Self-pay | Admitting: Physical Therapy

## 2022-01-06 ENCOUNTER — Ambulatory Visit: Payer: Medicare PPO

## 2022-01-06 ENCOUNTER — Ambulatory Visit: Payer: Medicare PPO | Attending: Hematology and Oncology | Admitting: Physical Therapy

## 2022-01-06 DIAGNOSIS — M6281 Muscle weakness (generalized): Secondary | ICD-10-CM

## 2022-01-06 DIAGNOSIS — R209 Unspecified disturbances of skin sensation: Secondary | ICD-10-CM

## 2022-01-06 DIAGNOSIS — R293 Abnormal posture: Secondary | ICD-10-CM | POA: Diagnosis not present

## 2022-01-06 DIAGNOSIS — M25611 Stiffness of right shoulder, not elsewhere classified: Secondary | ICD-10-CM | POA: Diagnosis not present

## 2022-01-06 DIAGNOSIS — Z483 Aftercare following surgery for neoplasm: Secondary | ICD-10-CM

## 2022-01-06 NOTE — Therapy (Signed)
OUTPATIENT PHYSICAL THERAPY ONCOLOGY TREATMENT  Patient Name: Catherine [name] MRN: [id number] DOB:[birth year], 67 y.o., female Today's Date: 01/06/2022   PT End of Session - 01/06/22 1020     Visit Number 7    Date for PT Re-Evaluation 02/01/22    PT Start Time 1017    PT Stop Time 1055    PT Time Calculation (min) 38 min    Activity Tolerance Patient tolerated treatment well    Behavior During Therapy WFL for tasks assessed/performed                 Past Medical History:  Diagnosis Date   Anxiety    Cancer ([place])    right breast cancer   High cholesterol    History of kidney stones    Hypertension    Hypothyroidism    Past Surgical History:  Procedure Laterality Date   ABDOMINAL HYSTERECTOMY     AXILLARY SENTINEL NODE BIOPSY Right 03/02/2021   Procedure: RIGHT AXILLARY SENTINEL NODE BIOPSY;  Surgeon: Donnie Mesa, MD;  Location: [place];  Service: General;  Laterality: Right;   BREAST LUMPECTOMY WITH RADIOACTIVE SEED AND SENTINEL LYMPH NODE BIOPSY Right 03/02/2021   Procedure: RIGHT BREAST LUMPECTOMY WITH RADIOACTIVE SEED;  Surgeon: Donnie Mesa, MD;  Location: [place];  Service: General;  Laterality: Right;   KIDNEY SURGERY Right    NECK SURGERY     PORTACATH PLACEMENT N/A 03/31/2021   Procedure: INSERTION PORT-A-CATH;  Surgeon: Donnie Mesa, MD;  Location: [place];  Service: General;  Laterality: N/A;   RE-EXCISION OF BREAST LUMPECTOMY Right 03/31/2021   Procedure: RE-EXCISION OF RIGHT BREAST LUMPECTOMY MARGINS;  Surgeon: Donnie Mesa, MD;  Location: [place];  Service: General;  Laterality: Right;   WISDOM TOOTH EXTRACTION     Patient Active Problem List   Diagnosis Date Noted   Hypothyroidism 06/04/2021   Dyslipidemia 06/04/2021   Moderate recurrent major depression ([place]) 06/04/2021   Vitamin D deficiency 06/04/2021   Other specified disorders of bone density and structure, other site 06/04/2021   Malignant neoplasm of lower-outer quadrant of right breast of  female, estrogen receptor positive ([place]) 02/08/2021    PCP: Nicholas Lose, MD  REFERRING PROVIDER: Nicholas Lose, MD  REFERRING DIAG: right breast cancer  THERAPY DIAG:  Aftercare following surgery for neoplasm  Stiffness of right shoulder, not elsewhere classified  Abnormal posture  Unspecified disturbances of skin sensation  Muscle weakness (generalized)  ONSET DATE: June   Rationale for Evaluation and Treatment: Rehabilitation  SUBJECTIVE:  SUBJECTIVE STATEMENT: Patient reports that her neuropathy pain kept her awake all night. It does not happen every night, but sometimes. Her shoulder pain is still severe.  PERTINENT HISTORY: HX of Malignant neoplasm of lower-outer quadrant of right breast of female, estrogen receptor positive ([place]) treated lumpectomy with 1/4 positive lymph nodes on 03/02/21 with chemo and radiation completed.   PAIN:  Are you having pain? Yes NPRS scale: 5/10 Pain location: right shoulder Pain orientation: Right  PAIN TYPE: aching and dull Pain description: constant   PRECAUTIONS: Rt UE lymphedema risk - needs to progress any UE strenthening slowly  WEIGHT BEARING RESTRICTIONS: No  FALLS:  Has patient fallen in last 6 months? No  LIVING ENVIRONMENT: Lives with: lives with their family and lives with their spouse Lives in: House/apartment   OCCUPATION: retired   LEISURE:   HAND DOMINANCE: right   PRIOR LEVEL OF FUNCTION: Independent  PATIENT GOALS: help with numbness and tingling in feet    OBJECTIVE:  COGNITION: Overall cognitive status: Within functional limits for tasks assessed   PALPATION:   OBSERVATIONS / OTHER ASSESSMENTS: Mild swelling in ankles and feet   SENSATION: Light touch: Deficits Right 2 sites felt  / left 5 sites felt    POSTURE: [facility]  UPPER EXTREMITY AROM/PROM:  A/PROM RIGHT   eval  11/18/2021 12/21/21  Shoulder extension  45   Shoulder flexion  89 painful  90 w/pain  Shoulder abduction  82 painful  87 w/pain  Shoulder internal rotation  30 WFL  Shoulder external rotation  60 WFL    (Blank rows = not tested)  LOWER EXTREMITYMMT:  A/PROM Right eval  Hip flexion 4/5  Hip extension   Hip abduction   Hip adduction   Hip internal rotation 3/5  Hip external rotation 4/5  Knee flexion   Knee extension   Ankle dorsiflexion 3/5  Ankle plantarflexion   Ankle inversion   Ankle eversion    (Blank rows = not tested)  A/PROM LEFT eval  Hip flexion 4/5  Hip extension   Hip abduction   Hip adduction   Hip internal rotation 4/5  Hip external rotation 4/5  Knee flexion   Knee extension   Ankle dorsiflexion 3/5  Ankle plantarflexion   Ankle inversion   Ankle eversion    (Blank rows = not tested)  LYMPHEDEMA ASSESSMENTS:   SURGERY TYPE/DATE: 02/10/2021; Right breast lumpectomy and right axillary sentinel Lymph Node biopsy NUMBER OF LYMPH NODES REMOVED: 4 CHEMOTHERAPY: yes RADIATION:yes HORMONE TREATMENT: yes  FUNCTIONAL TESTS:  30 seconds chair stand test- 7  6 minute walk test: 1202 4/10 RPE scale difficulty rating Berg Balance Scale: 41 Grip Strength: R:  20 pounds     L: 45 pounds   GAIT: Distance walked: 1202 feet    TODAY'S TREATMENT:  DATE:  01/06/22 UBE L2 x 3 min forward and 3 back STM to R pects, very gentle due to severe soreness, stretch to pect, but very limited due to guarding.  Attempted Trendelenberg with RUE, limited success due to guarding Attempted wall slides with towel on R, caused pain at 90, so deferred Doorway stretch to pects Supine bridge with hip abd against G tband Standing side to side step against G tband Standing B  heel raise with UE support for balance.  12/21/21 UBE L2 x104mns Shoulder ROM and BERG reassess Leg ext 5# 2x10 HS curls 20# 2x10 Calf stretch 30s  Rows and ext green 2x10 Horizontal abd red x5, x10 yellow CP to R ant chest and MH to neck asnd shoulders for up traps tightness and pain  12/19/21 UBE L1 x 3 min forward/3 min back Balance training, SLS-alternately tapping 4 " step, progressed to multiple taps, then tapping with rotation, no issues Airex, slow march, no issues Quick steps-box step in each direction, then moving upon therapsit command to change directions quickly, no unsteadiness noted. Initiated R shoulder activities, found that she was severely sore and painful. Further assessment of R shoulder-severely tight and painful R pects, supraspinatus TTP. Sh horiz abd only to neutral, shoulder abd < 90 degrees due to pain Provided STM to R pects with stretch and cross friction, measured due to severe pain. Finished with CP to R ant chest and MH to neck asnd shoulders for up traps tightness and pain  12/10/21 NuStep L5 x687ms  Rows and ext redTB 2x10  STS on airex 2x10 Step ups on airex Side steps on airex Shoulder flexion standing on airex 2# 2x10 Leg ext 5# AW 2x10 HS curls 20# 2x10 Walking on beam    12/01/21 UBE L1 x6m89m  Wall slides flexion and abd x10 each  Rows and ext yellowTB 2x10 PROM all directions  Supine shoulder flexion 2x10 1#  Walking on beam forwards and side steps  Side steps over obstacles- CGA STS on airex 2x10  NuStep L5 x6mi53m   11/18/2021: Exercise: Shoulder assessment Shoulder flex AAROM in supine  Shoulder squeeze  Shoulder roll   NeuroReed - balance walking looking side to side  - balance walking looking up and down  - obstacle course walking over hurdles - stepping over obstacles side ways  - single leg stance on foam 2 x 30 secs  -Sit to stand with foam under one foot bil -towel scrunches   11/04/2021: EVAL  PATIENT  EDUCATION:  Education details: POC Person educated: Patient Education method: Explanation Education comprehension: verbalized understanding  HOME EXERCISE PROGRAM: Towel scrunches with toes Scapular retraction Posterior shoulder rolls AAROM flexion seated and supine using opposite UE.    [id number]: CLINICAL IMPRESSION: Patient is extremely guarded in her R shoulder movement due to pain and fear of pain in pects. Attempted multiple ST techniques and multiple stretches with limited success to mobilize the shoulder and pects. Updated Hep to include additional mobilization to shoulder as well as lower body strengthening. Instructed patien tto focus on relaxation of R shoulder and letting it go to increase movement.   OBJECTIVE IMPAIRMENTS: decreased balance, decreased mobility, difficulty walking, decreased strength, and impaired sensation.   ACTIVITY LIMITATIONS: carrying, standing, and stairs  PARTICIPATION LIMITATIONS: driving and community activity  PERSONAL FACTORS: 1-2 comorbidities: right breast cancer   are also affecting patient's functional outcome.   REHAB POTENTIAL: Good  CLINICAL DECISION MAKING: Evolving/moderate complexity  EVALUATION COMPLEXITY: Moderate  GOALS: Goals reviewed with patient? Yes  LONG TERM GOALS: Target date: 02/01/2022  Pt will be independent with advanced HEP to maintain improvements made throughout therapy Baseline: Goal status: INITIAL  2.  Patient will increase 30 second sit to stands to 12 to show an increase in functional strength  Baseline: 7, 9-12/19/23 Goal status: IN PROGRESS  3.  Patient will increase berg balance score from a 41 to a 50. Baseline: 41, 50/56-12/19/23 Goal status: MET  4.  Patient will increase all LE strength to at least a 4/5 or greater Baseline:  Goal status: ongoing  5. Patient will increase R shoulder ROM to [facility]  Goal status: ongoing  PLAN:  PT FREQUENCY: 2x/week  PT DURATION: 6  weeks  PLANNED INTERVENTIONS: Therapeutic exercises, Therapeutic activity, Neuromuscular re-education, Balance training, Gait training, Patient/Family education, Self Care, Joint mobilization, Dry Needling, Cryotherapy, Moist heat, Manual lymph drainage, Compression bandaging, scar mobilization, Taping, Biofeedback, and Manual therapy  PLAN FOR NEXT SESSION: STM to R pect and ant shoulder, stretch, Need to re establish approriate scapulo humeral rhythm.Acute pain modalities for R shoulder pain, strengthening.   Ethel Rana DPT 01/06/22 11:00 AM  01/06/22 11:00 AM  01/06/2022  11:00 AM

## 2022-01-10 DIAGNOSIS — D72819 Decreased white blood cell count, unspecified: Secondary | ICD-10-CM | POA: Diagnosis not present

## 2022-01-14 ENCOUNTER — Ambulatory Visit: Payer: Medicare PPO | Admitting: Physical Therapy

## 2022-01-14 ENCOUNTER — Encounter: Payer: Self-pay | Admitting: Physical Therapy

## 2022-01-14 DIAGNOSIS — Z483 Aftercare following surgery for neoplasm: Secondary | ICD-10-CM

## 2022-01-14 DIAGNOSIS — R209 Unspecified disturbances of skin sensation: Secondary | ICD-10-CM | POA: Diagnosis not present

## 2022-01-14 DIAGNOSIS — M25611 Stiffness of right shoulder, not elsewhere classified: Secondary | ICD-10-CM | POA: Diagnosis not present

## 2022-01-14 DIAGNOSIS — M6281 Muscle weakness (generalized): Secondary | ICD-10-CM

## 2022-01-14 DIAGNOSIS — R293 Abnormal posture: Secondary | ICD-10-CM | POA: Diagnosis not present

## 2022-01-14 NOTE — Therapy (Signed)
OUTPATIENT PHYSICAL THERAPY ONCOLOGY TREATMENT  Patient Name: Catherine [name] MRN: [id number] DOB:1955-08-16, 67 y.o., female Today's Date: 01/14/2022   PT End of Session - 01/14/22 0849     Visit Number 8    Date for PT Re-Evaluation 02/01/22    PT Start Time 0847    PT Stop Time 0925    PT Time Calculation (min) 38 min    Activity Tolerance Patient tolerated treatment well    Behavior During Therapy WFL for tasks assessed/performed                  Past Medical History:  Diagnosis Date   Anxiety    Cancer ([place])    right breast cancer   High cholesterol    History of kidney stones    Hypertension    Hypothyroidism    Past Surgical History:  Procedure Laterality Date   ABDOMINAL HYSTERECTOMY     AXILLARY SENTINEL NODE BIOPSY Right 03/02/2021   Procedure: RIGHT AXILLARY SENTINEL NODE BIOPSY;  Surgeon: Donnie Mesa, MD;  Location: [place];  Service: General;  Laterality: Right;   BREAST LUMPECTOMY WITH RADIOACTIVE SEED AND SENTINEL LYMPH NODE BIOPSY Right 03/02/2021   Procedure: RIGHT BREAST LUMPECTOMY WITH RADIOACTIVE SEED;  Surgeon: Donnie Mesa, MD;  Location: [facility];  Service: General;  Laterality: Right;   KIDNEY SURGERY Right    NECK SURGERY     PORTACATH PLACEMENT N/A 03/31/2021   Procedure: INSERTION PORT-A-CATH;  Surgeon: Donnie Mesa, MD;  Location: [place];  Service: General;  Laterality: N/A;   RE-EXCISION OF BREAST LUMPECTOMY Right 03/31/2021   Procedure: RE-EXCISION OF RIGHT BREAST LUMPECTOMY MARGINS;  Surgeon: Donnie Mesa, MD;  Location: [place];  Service: General;  Laterality: Right;   WISDOM TOOTH EXTRACTION     Patient Active Problem List   Diagnosis Date Noted   Hypothyroidism 06/04/2021   Dyslipidemia 06/04/2021   Moderate recurrent major depression ([place]) 06/04/2021   Vitamin D deficiency 06/04/2021   Other specified disorders of bone density and structure, other site 06/04/2021   Malignant neoplasm of lower-outer quadrant of right breast of  female, estrogen receptor positive ([place]) 02/08/2021    PCP: Nicholas Lose, MD  REFERRING PROVIDER: Nicholas Lose, MD  REFERRING DIAG: right breast cancer  THERAPY DIAG:  Aftercare following surgery for neoplasm  Stiffness of right shoulder, not elsewhere classified  Abnormal posture  Muscle weakness (generalized)  Unspecified disturbances of skin sensation  ONSET DATE: June   Rationale for Evaluation and Treatment: Rehabilitation  SUBJECTIVE:  SUBJECTIVE STATEMENT: Patient reports that her shoulder pain remains high. It does fluctuate some.  PERTINENT HISTORY: HX of Malignant neoplasm of lower-outer quadrant of right breast of female, estrogen receptor positive ([place]) treated lumpectomy with 1/4 positive lymph nodes on 03/02/21 with chemo and radiation completed.   PAIN:  Are you having pain? Yes NPRS scale: 5/10 Pain location: right shoulder Pain orientation: Right  PAIN TYPE: aching and dull Pain description: constant   PRECAUTIONS: Rt UE lymphedema risk - needs to progress any UE strenthening slowly  WEIGHT BEARING RESTRICTIONS: No  FALLS:  Has patient fallen in last 6 months? No  LIVING ENVIRONMENT: Lives with: lives with their family and lives with their spouse Lives in: House/apartment   OCCUPATION: retired   LEISURE:   HAND DOMINANCE: right   PRIOR LEVEL OF FUNCTION: Independent  PATIENT GOALS: help with numbness and tingling in feet    OBJECTIVE:  COGNITION: Overall cognitive status: Within functional limits for tasks assessed   PALPATION:   OBSERVATIONS / OTHER ASSESSMENTS: Mild swelling in ankles and feet   SENSATION: Light touch: Deficits Right 2 sites felt  / left 5 sites felt   POSTURE: [facility]  UPPER EXTREMITY AROM/PROM:  A/PROM RIGHT   eval   11/18/2021 12/21/21  Shoulder extension  45   Shoulder flexion  89 painful  90 w/pain  Shoulder abduction  82 painful  87 w/pain  Shoulder internal rotation  30 WFL  Shoulder external rotation  60 WFL    (Blank rows = not tested)  LOWER EXTREMITYMMT:  A/PROM Right eval  Hip flexion 4/5  Hip extension   Hip abduction   Hip adduction   Hip internal rotation 3/5  Hip external rotation 4/5  Knee flexion   Knee extension   Ankle dorsiflexion 3/5  Ankle plantarflexion   Ankle inversion   Ankle eversion    (Blank rows = not tested)  A/PROM LEFT eval  Hip flexion 4/5  Hip extension   Hip abduction   Hip adduction   Hip internal rotation 4/5  Hip external rotation 4/5  Knee flexion   Knee extension   Ankle dorsiflexion 3/5  Ankle plantarflexion   Ankle inversion   Ankle eversion    (Blank rows = not tested)  LYMPHEDEMA ASSESSMENTS:   SURGERY TYPE/DATE: 02/10/2021; Right breast lumpectomy and right axillary sentinel Lymph Node biopsy NUMBER OF LYMPH NODES REMOVED: 4 CHEMOTHERAPY: yes RADIATION:yes HORMONE TREATMENT: yes  FUNCTIONAL TESTS:  30 seconds chair stand test- 7  6 minute walk test: 1202 4/10 RPE scale difficulty rating Berg Balance Scale: 41 Grip Strength: R:  20 pounds     L: 45 pounds   GAIT: Distance walked: 1202 feet    TODAY'S TREATMENT:  DATE:  01/14/22 UBE L1, 3 min forward, 3 back MH and ICF to R ant shoulder/pects for pain relief x 12 minutes Very gentle STM to R pects and ant chest, still very tender PROM to R shoulder limited, tolerated shoulder ER in approx 40 degrees of abd Standing wall push ups, tolerated well, able to activate pects and achieve stretch as well.   01/06/22 UBE L2 x 3 min forward and 3 back STM to R pects, very gentle due to severe soreness, stretch to pect, but very limited due to  guarding.  Attempted pendulum exercises with RUE, limited success due to guarding Attempted wall slides with towel on R, caused pain at 90, so deferred Doorway stretch to pects Supine bridge with hip abd against G tband Standing side to side step against G tband Standing B heel raise with UE support for balance.  12/21/21 UBE L2 x34mns Shoulder ROM and BERG reassess Leg ext 5# 2x10 HS curls 20# 2x10 Calf stretch 30s  Rows and ext green 2x10 Horizontal abd red x5, x10 yellow CP to R ant chest and MH to neck asnd shoulders for up traps tightness and pain  12/19/21 UBE L1 x 3 min forward/3 min back Balance training, SLS-alternately tapping 4 " step, progressed to multiple taps, then tapping with rotation, no issues Airex, slow march, no issues Quick steps-box step in each direction, then moving upon therapsit command to change directions quickly, no unsteadiness noted. Initiated R shoulder activities, found that she was severely sore and painful. Further assessment of R shoulder-severely tight and painful R pects, supraspinatus TTP. Sh horiz abd only to neutral, shoulder abd < 90 degrees due to pain Provided STM to R pects with stretch and cross friction, measured due to severe pain. Finished with CP to R ant chest and MH to neck asnd shoulders for up traps tightness and pain  12/10/21 NuStep L5 x646ms  Rows and ext redTB 2x10  STS on airex 2x10 Step ups on airex Side steps on airex Shoulder flexion standing on airex 2# 2x10 Leg ext 5# AW 2x10 HS curls 20# 2x10 Walking on beam    12/01/21 UBE L1 x6m63m  Wall slides flexion and abd x10 each  Rows and ext yellowTB 2x10 PROM all directions  Supine shoulder flexion 2x10 1#  Walking on beam forwards and side steps  Side steps over obstacles- CGA STS on airex 2x10  NuStep L5 x6mi12m   11/18/2021: Exercise: Shoulder assessment Shoulder flex AAROM in supine  Shoulder squeeze  Shoulder roll   NeuroReed - balance walking  looking side to side  - balance walking looking up and down  - obstacle course walking over hurdles - stepping over obstacles side ways  - single leg stance on foam 2 x 30 secs  -Sit to stand with foam under one foot bil -towel scrunches   11/04/2021: EVAL  PATIENT EDUCATION:  Education details: POC Person educated: Patient Education method: Explanation Education comprehension: verbalized understanding  HOME EXERCISE PROGRAM: Towel scrunches with toes Scapular retraction Posterior shoulder rolls AAROM flexion seated and supine using opposite UE.    [id number]: CLINICAL IMPRESSION: Patient reports continued pain and spasm in R shoulder area. Attempted to treat the pain and spasm prior to mobilization, still painful. She did tolerate some wall push ups. HEP updated to include more stretch and strengthening at chest. She has recently had blood work and awaiting results. Encouraged to inform her Dr of the continued severe pain in her chest  to assess for other causes.   OBJECTIVE IMPAIRMENTS: decreased balance, decreased mobility, difficulty walking, decreased strength, and impaired sensation.   ACTIVITY LIMITATIONS: carrying, standing, and stairs  PARTICIPATION LIMITATIONS: driving and community activity  PERSONAL FACTORS: 1-2 comorbidities: right breast cancer   are also affecting patient's functional outcome.   REHAB POTENTIAL: Good  CLINICAL DECISION MAKING: Evolving/moderate complexity  EVALUATION COMPLEXITY: Moderate  GOALS: Goals reviewed with patient? Yes  LONG TERM GOALS: Target date: 02/01/2022  Pt will be independent with advanced HEP to maintain improvements made throughout therapy Baseline: Goal status: INITIAL  2.  Patient will increase 30 second sit to stands to 12 to show an increase in functional strength  Baseline: 7, 9-12/19/23 Goal status: IN PROGRESS  3.  Patient will increase berg balance score from a 41 to a 50. Baseline: 41,  50/56-12/19/23 Goal status: MET  4.  Patient will increase all LE strength to at least a 4/5 or greater Baseline:  Goal status: ongoing  5. Patient will increase R shoulder ROM to [facility]  Goal status: ongoing  PLAN:  PT FREQUENCY: 2x/week  PT DURATION: 6 weeks  PLANNED INTERVENTIONS: Therapeutic exercises, Therapeutic activity, Neuromuscular re-education, Balance training, Gait training, Patient/Family education, Self Care, Joint mobilization, Dry Needling, Cryotherapy, Moist heat, Manual lymph drainage, Compression bandaging, scar mobilization, Taping, Biofeedback, and Manual therapy  PLAN FOR NEXT SESSION: STM to R pect and ant shoulder, stretch, Need to re establish approriate scapulo humeral rhythm.Acute pain modalities for R shoulder pain, strengthening. DN?   Ethel Rana DPT 01/14/22 9:41 AM

## 2022-01-18 ENCOUNTER — Ambulatory Visit: Payer: Medicare PPO | Admitting: Physical Therapy

## 2022-01-20 ENCOUNTER — Other Ambulatory Visit: Payer: Self-pay | Admitting: *Deleted

## 2022-01-20 DIAGNOSIS — Z170 Estrogen receptor positive status [ER+]: Secondary | ICD-10-CM

## 2022-01-21 ENCOUNTER — Other Ambulatory Visit: Payer: Self-pay | Admitting: *Deleted

## 2022-01-21 ENCOUNTER — Inpatient Hospital Stay: Payer: Medicare PPO

## 2022-01-21 ENCOUNTER — Telehealth: Payer: Self-pay | Admitting: Hematology and Oncology

## 2022-01-21 ENCOUNTER — Other Ambulatory Visit: Payer: Self-pay

## 2022-01-21 ENCOUNTER — Inpatient Hospital Stay: Payer: Medicare PPO | Attending: Adult Health | Admitting: Hematology and Oncology

## 2022-01-21 VITALS — BP 142/73 | HR 66 | Temp 97.0°F | Resp 13 | Wt 162.4 lb

## 2022-01-21 DIAGNOSIS — Z79811 Long term (current) use of aromatase inhibitors: Secondary | ICD-10-CM | POA: Insufficient documentation

## 2022-01-21 DIAGNOSIS — Z170 Estrogen receptor positive status [ER+]: Secondary | ICD-10-CM

## 2022-01-21 DIAGNOSIS — D709 Neutropenia, unspecified: Secondary | ICD-10-CM | POA: Diagnosis not present

## 2022-01-21 DIAGNOSIS — Z79899 Other long term (current) drug therapy: Secondary | ICD-10-CM | POA: Diagnosis not present

## 2022-01-21 DIAGNOSIS — Z9221 Personal history of antineoplastic chemotherapy: Secondary | ICD-10-CM | POA: Diagnosis not present

## 2022-01-21 DIAGNOSIS — C50511 Malignant neoplasm of lower-outer quadrant of right female breast: Secondary | ICD-10-CM

## 2022-01-21 DIAGNOSIS — Z923 Personal history of irradiation: Secondary | ICD-10-CM | POA: Insufficient documentation

## 2022-01-21 LAB — CMP (CANCER CENTER ONLY)
ALT: 8 U/L (ref 0–44)
AST: 13 U/L — ABNORMAL LOW (ref 15–41)
Albumin: 3.7 g/dL (ref 3.5–5.0)
Alkaline Phosphatase: 93 U/L (ref 38–126)
Anion gap: 5 (ref 5–15)
BUN: 10 mg/dL (ref 8–23)
CO2: 30 mmol/L (ref 22–32)
Calcium: 9.5 mg/dL (ref 8.9–10.3)
Chloride: 107 mmol/L (ref 98–111)
Creatinine: 0.91 mg/dL (ref 0.44–1.00)
GFR, Estimated: >60 mL/min (ref >60)
Glucose, Bld: 71 mg/dL (ref 70–99)
Potassium: 4.1 mmol/L (ref 3.5–5.1)
Sodium: 142 mmol/L (ref 135–145)
Total Bilirubin: 0.3 mg/dL (ref 0.3–1.2)
Total Protein: 6.6 g/dL (ref 6.5–8.1)

## 2022-01-21 LAB — CBC WITH DIFFERENTIAL (CANCER CENTER ONLY)
Abs Immature Granulocytes: 0 10*3/uL (ref 0.00–0.07)
Basophils Absolute: 0 10*3/uL (ref 0.0–0.1)
Basophils Relative: 1 %
Eosinophils Absolute: 0.1 10*3/uL (ref 0.0–0.5)
Eosinophils Relative: 3 %
HCT: 35.8 % — ABNORMAL LOW (ref 36.0–46.0)
Hemoglobin: 11.9 g/dL — ABNORMAL LOW (ref 12.0–15.0)
Immature Granulocytes: 0 %
Lymphocytes Relative: 39 %
Lymphs Abs: 0.8 10*3/uL (ref 0.7–4.0)
MCH: 29.2 pg (ref 26.0–34.0)
MCHC: 33.2 g/dL (ref 30.0–36.0)
MCV: 88 fL (ref 80.0–100.0)
Monocytes Absolute: 0.2 10*3/uL (ref 0.1–1.0)
Monocytes Relative: 8 %
Neutro Abs: 1 10*3/uL — ABNORMAL LOW (ref 1.7–7.7)
Neutrophils Relative %: 49 %
Platelet Count: 193 10*3/uL (ref 150–400)
RBC: 4.07 MIL/uL (ref 3.87–5.11)
RDW: 13.8 % (ref 11.5–15.5)
WBC Count: 2.1 10*3/uL — ABNORMAL LOW (ref 4.0–10.5)
nRBC: 0 % (ref 0.0–0.2)

## 2022-01-21 LAB — MAGNESIUM: Magnesium: 1.7 mg/dL (ref 1.7–2.4)

## 2022-01-21 MED ORDER — LIDOCAINE HCL 2 % IJ SOLN: 20.0000 mL | Freq: Once | INTRAMUSCULAR | Status: DC

## 2022-01-21 NOTE — Progress Notes (Signed)
Patient Care Team: Nicholas Lose, MD as PCP - General (Hematology and Oncology) Nicholas Lose, MD as Consulting Physician (Hematology and Oncology) Donnie Mesa, MD as Consulting Physician (General Surgery) Kyung Rudd, MD as Consulting Physician (Radiation Oncology)  DIAGNOSIS:  Encounter Diagnoses  Name Primary?   Malignant neoplasm of lower-outer quadrant of right breast of female, estrogen receptor positive ([place]) Yes   Neutropenia, unspecified type ([place])     SUMMARY OF ONCOLOGIC HISTORY: Oncology History  Malignant neoplasm of lower-outer quadrant of right breast of female, estrogen receptor positive ([place])  01/29/2021 Initial Diagnosis   Screening mammogram detected right breast mass 1.2 cm, axilla normal, biopsy revealed grade 2 IDC with DCIS ER 90%, PR 30%, HER2 2+ by IHC FISH negative   02/08/2021 Cancer Staging   Staging form: Breast, AJCC 8th Edition - Clinical stage from 02/08/2021: Stage IA (cT1c, cN0, cM0, G2, ER+, PR+, HER2: Equivocal) - Signed by Nicholas Lose, MD on 02/08/2021 Stage prefix: Initial diagnosis Histologic grading system: 3 grade system   03/02/2021 Surgery   Right lumpectomy: Grade 2 IDC 1.2 cm with intermediate grade DCIS, focal involvement of anterior medial margin junction, 1/4 lymph nodes positive   03/19/2021 Oncotype testing   Oncotype DX score: 28.  Distant recurrence at 10 years: 17%   03/24/2021 Cancer Staging   Staging form: Breast, AJCC 8th Edition - Pathologic: Stage IA (pT1c, pN1(sn), cM0, G2, ER+, PR+, HER2-, Oncotype DX score: 28) - Signed by Nicholas Lose, MD on 03/24/2021 Method of lymph node assessment: Sentinel lymph node biopsy Multigene prognostic tests performed: Oncotype DX Recurrence score range: Greater than or equal to 11 Histologic grading system: 3 grade system   04/23/2021 - 06/28/2021 Chemotherapy   Patient is on Treatment Plan : BREAST TC q21d     07/28/2021 - 09/13/2021 Radiation Therapy   Site Technique Total Dose (Gy)  Dose per Fx (Gy) Completed Fx Beam Energies  Breast, Right: Breast_R 3D 50.4/50.4 1.8 28/28 6X, 10X  Breast, Right: Breast_R_SCLV 3D 50.4/50.4 1.8 28/28 6X, 10X  Breast, Right: Breast_R_Bst 3D 10/10 2 5/5 6X, 10X     09/2021 -  Anti-estrogen oral therapy   Anastrozole x 7 years     CHIEF COMPLIANT: Neutropenia  INTERVAL HISTORY: Catherine [name] is 67 - year old reports to the clinic today for a follow-up.   ALLERGIES:  is allergic to penicillins.  MEDICATIONS:  Current Outpatient Medications  Medication Sig Dispense Refill   acetaminophen (TYLENOL) 500 MG tablet Take 1,000 mg by mouth every 6 (six) hours as needed for moderate pain.     anastrozole (ARIMIDEX) 1 MG tablet Take 1 tablet (1 mg total) by mouth daily. 90 tablet 3   Calcium Magnesium Zinc 333-133-5 MG TABS Take 1 tablet by mouth daily. (Patient taking differently: Take 1 tablet by mouth once a week.)     diphenhydrAMINE (BENADRYL) 25 MG tablet Take 25 mg by mouth every 6 (six) hours as needed for allergies.     diphenoxylate-atropine (LOMOTIL) 2.5-0.025 MG tablet Take 1 tablet by mouth 4 (four) times daily as needed for diarrhea or loose stools. (Patient not taking: Reported on 12/08/2021) 60 tablet 1   ergocalciferol (VITAMIN D2) 1.25 MG (50000 UT) capsule Take 1 capsule (50,000 Units total) by mouth once a week.     escitalopram (LEXAPRO) 5 MG tablet Take 5 mg by mouth once a week.     gabapentin (NEURONTIN) 300 MG capsule Take 1 capsule (300 mg total) by mouth at bedtime. 90 capsule  3   levothyroxine (SYNTHROID) 50 MCG tablet Take 1 tablet (50 mcg total) by mouth daily before breakfast.     metoprolol tartrate (LOPRESSOR) 25 MG tablet Take 12.5 mg by mouth 2 (two) times daily.     oxyCODONE (OXY IR/ROXICODONE) 5 MG immediate release tablet Take by mouth.     rosuvastatin (CRESTOR) 5 MG tablet Take 1 tablet (5 mg total) by mouth daily.     Current Facility-Administered Medications  Medication Dose Route Frequency Provider  Last Rate Last Admin   ondansetron (ZOFRAN) injection 8 mg  8 mg Intravenous Once Nicholas Lose, MD       Facility-Administered Medications Ordered in Other Visits  Medication Dose Route Frequency Provider Last Rate Last Admin   ondansetron (ZOFRAN) 4 MG/2ML injection            ondansetron (ZOFRAN) 4 MG/2ML injection             PHYSICAL EXAMINATION: ECOG PERFORMANCE STATUS: 1 - Symptomatic but completely ambulatory  Vitals:   01/21/22 0929  BP: (!) 142/73  Pulse: 66  Resp: 13  Temp: (!) 97 F (36.1 C)  SpO2: 100%   Filed Weights   01/21/22 0929  Weight: 162 lb 6.4 oz (73.7 kg)      LABORATORY DATA:  I have reviewed the data as listed    Latest Ref Rng & Units 01/21/2022    9:00 AM 12/08/2021    9:29 AM 09/09/2021   10:49 AM  CMP  Glucose 70 - 99 mg/dL 71  98  97   BUN 8 - 23 mg/dL '10  12  11   '$ Creatinine 0.44 - 1.00 mg/dL 0.91  0.97  0.85   Sodium 135 - 145 mmol/L 142  140  137   Potassium 3.5 - 5.1 mmol/L 4.1  4.5  4.3   Chloride 98 - 111 mmol/L 107  107  103   CO2 22 - 32 mmol/L '30  29  31   '$ Calcium 8.9 - 10.3 mg/dL 9.5  10.0  9.6   Total Protein 6.5 - 8.1 g/dL 6.6  7.2  7.5   Total Bilirubin 0.3 - 1.2 mg/dL 0.3  0.6  0.3   Alkaline Phos 38 - 126 U/L 93  100  88   AST 15 - 41 U/L '13  16  15   '$ ALT 0 - 44 U/L '8  12  9     '$ Lab Results  Component Value Date   WBC 2.1 (L) 01/21/2022   HGB 11.9 (L) 01/21/2022   HCT 35.8 (L) 01/21/2022   MCV 88.0 01/21/2022   PLT 193 01/21/2022   NEUTROABS 1.0 (L) 01/21/2022    ASSESSMENT & PLAN:  Malignant neoplasm of lower-outer quadrant of right breast of female, estrogen receptor positive ([place]) /27/2023: Screening mammogram detected 1.2 cm right breast mass: Grade 2 IDC with DCIS ER 90%, PR 30%, HER2 negative Oncotype score: 28: 17% risk of distant recurrence at 10 years     Treatment plan: 1.  Breast conserving surgery with sentinel lymph node biopsy  03/02/2021: Right lumpectomy: Grade 2 IDC 1.2 cm, intermediate grade  DCIS, focal anterior and medial margin positive, 1/4 lymph nodes positive 03/31/2021: Reexcision margins: Benign 2. Adjuvant chemotherapy with Taxotere and Cytoxan every 3 weeks x4 cycles completed 06/25/2021 3.  Adjuvant radiation 07/30/2021-09/13/2021 4.  Followed by adjuvant antiestrogen therapy with anastrozole started October 2023 ---------------------------------------------------------------------------------------------------------- Anastrozole toxicities: Diffuse musculoskeletal pains: I recommended that she discontinue anastrozole at this time.  We will reassess her in a couple of weeks and then decide if we need to switch her to letrozole.  Chemo-induced peripheral neuropathy: Stable Limitation of right shoulder extension: I offered her physical therapy but patient did not want to do it.   Neutropenia ([place]) Lab review: 12/18/2014: WBC 3.2 03/31/2021: WBC 4.1 Chemotherapy with Taxotere and Cytoxan 04/23/2021 -06/25/2021 09/09/2021: WBC 2.1, hemoglobin 12, ANC 1.4, ALC 0.4 12/22/2021: WBC 2.7, hemoglobin 11.8, platelets 203, ANC 1.5, ALC 0.8 01/21/2022: WBC 2.1, ANC 1, hemoglobin 11.9, platelets 193  Discussion: Most likely cause of the is prior chemotherapy.    Treatment plan: Blood smear evaluation with CBC with differential Check I26 and folic acid levels ANA Bone marrow biopsy to make sure there is no other etiology.       Orders Placed This Encounter  Procedures   ANA, IFA (with reflex)    Standing Status:   Future    Standing Expiration Date:   01/21/2023   Vitamin B12    Standing Status:   Future    Standing Expiration Date:   01/22/2023   Folate    Standing Status:   Future    Standing Expiration Date:   01/22/2023   The patient has a good understanding of the overall plan. she agrees with it. she will call with any problems that may develop before the next visit here. Total time spent: 30 mins including face to face time and time spent for planning, charting and  co-ordination of care   Harriette Ohara, MD 01/21/22    I Gardiner Coins am acting as a Education administrator for [employer]  I have reviewed the above documentation for accuracy and completeness, and I agree with the above.

## 2022-01-21 NOTE — Assessment & Plan Note (Addendum)
Lab review: 12/18/2014: WBC 3.2 03/31/2021: WBC 4.1 Chemotherapy with Taxotere and Cytoxan 04/23/2021 -06/25/2021 09/09/2021: WBC 2.1, hemoglobin 12, ANC 1.4, ALC 0.4 12/22/2021: WBC 2.7, hemoglobin 11.8, platelets 203, ANC 1.5, ALC 0.8  Discussion: Most likely cause of the is prior chemotherapy. I discussed with the patient that she is not at risk for infection with her current neutrophil count. It is also likely that the white blood cell count might take much longer to heal and recover.  Treatment plan: Blood smear evaluation with CBC with differential Check X51 and folic acid levels ANA  If there is persistent decrease in the white blood cell count with ANC goes below 1 then we will do a bone marrow biopsy.

## 2022-01-21 NOTE — Assessment & Plan Note (Signed)
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%, HER2 negative Oncotype score: 28: 17% risk of distant recurrence at 10 years     Treatment plan: 1.  Breast conserving surgery with sentinel lymph node biopsy  03/02/2021: Right lumpectomy: Grade 2 IDC 1.2 cm, intermediate grade DCIS, focal anterior and medial margin positive, 1/4 lymph nodes positive 03/31/2021: Reexcision margins: Benign 2. Adjuvant chemotherapy with Taxotere and Cytoxan every 3 weeks x4 cycles completed 06/25/2021 3.  Adjuvant radiation 07/30/2021-09/13/2021 4.  Followed by adjuvant antiestrogen therapy with anastrozole started October 2023 ---------------------------------------------------------------------------------------------------------- Anastrozole toxicities:  Chemo-induced peripheral neuropathy:

## 2022-01-25 ENCOUNTER — Encounter: Payer: Self-pay | Admitting: Physical Therapy

## 2022-01-25 ENCOUNTER — Ambulatory Visit: Payer: Medicare PPO | Admitting: Physical Therapy

## 2022-01-25 DIAGNOSIS — Z483 Aftercare following surgery for neoplasm: Secondary | ICD-10-CM | POA: Diagnosis not present

## 2022-01-25 DIAGNOSIS — R209 Unspecified disturbances of skin sensation: Secondary | ICD-10-CM | POA: Diagnosis not present

## 2022-01-25 DIAGNOSIS — R293 Abnormal posture: Secondary | ICD-10-CM

## 2022-01-25 DIAGNOSIS — M25611 Stiffness of right shoulder, not elsewhere classified: Secondary | ICD-10-CM | POA: Diagnosis not present

## 2022-01-25 DIAGNOSIS — M6281 Muscle weakness (generalized): Secondary | ICD-10-CM

## 2022-01-25 NOTE — Therapy (Signed)
OUTPATIENT PHYSICAL THERAPY ONCOLOGY TREATMENT  Patient Name: Catherine [name] MRN: [id number] DOB:02-24-1955, 67 y.o., female Today's Date: 01/25/2022   PT End of Session - 01/25/22 1104     Visit Number 9    Date for PT Re-Evaluation 02/01/22    PT Start Time 1100    PT Stop Time 1140    PT Time Calculation (min) 40 min    Activity Tolerance Patient tolerated treatment well    Behavior During Therapy WFL for tasks assessed/performed                   Past Medical History:  Diagnosis Date   Anxiety    Cancer ([place])    right breast cancer   High cholesterol    History of kidney stones    Hypertension    Hypothyroidism    Past Surgical History:  Procedure Laterality Date   ABDOMINAL HYSTERECTOMY     AXILLARY SENTINEL NODE BIOPSY Right 03/02/2021   Procedure: RIGHT AXILLARY SENTINEL NODE BIOPSY;  Surgeon: Donnie Mesa, MD;  Location: [facility];  Service: General;  Laterality: Right;   BREAST LUMPECTOMY WITH RADIOACTIVE SEED AND SENTINEL LYMPH NODE BIOPSY Right 03/02/2021   Procedure: RIGHT BREAST LUMPECTOMY WITH RADIOACTIVE SEED;  Surgeon: Donnie Mesa, MD;  Location: [facility];  Service: General;  Laterality: Right;   KIDNEY SURGERY Right    NECK SURGERY     PORTACATH PLACEMENT N/A 03/31/2021   Procedure: INSERTION PORT-A-CATH;  Surgeon: Donnie Mesa, MD;  Location: [facility];  Service: General;  Laterality: N/A;   RE-EXCISION OF BREAST LUMPECTOMY Right 03/31/2021   Procedure: RE-EXCISION OF RIGHT BREAST LUMPECTOMY MARGINS;  Surgeon: Donnie Mesa, MD;  Location: [facility];  Service: General;  Laterality: Right;   WISDOM TOOTH EXTRACTION     Patient Active Problem List   Diagnosis Date Noted   Neutropenia ([place]) 01/21/2022   Hypothyroidism 06/04/2021   Dyslipidemia 06/04/2021   Moderate recurrent major depression ([place]) 06/04/2021   Vitamin D deficiency 06/04/2021   Other specified disorders of bone density and structure, other site 06/04/2021   Malignant neoplasm of  lower-outer quadrant of right breast of female, estrogen receptor positive ([place]) 02/08/2021    PCP: Nicholas Lose, MD  REFERRING PROVIDER: Nicholas Lose, MD  REFERRING DIAG: right breast cancer  THERAPY DIAG:  Aftercare following surgery for neoplasm  Stiffness of right shoulder, not elsewhere classified  Abnormal posture  Muscle weakness (generalized)  Unspecified disturbances of skin sensation  ONSET DATE: June   Rationale for Evaluation and Treatment: Rehabilitation  SUBJECTIVE:  SUBJECTIVE STATEMENT: Patient reports that she is scheduled for a bone marrow biopsy in Feb due to her blood counts decreasing.  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   PAIN:  Are you having pain? Yes NPRS scale: 5/10 Pain location: right shoulder Pain orientation: Right  PAIN TYPE: aching and dull Pain description: constant   PRECAUTIONS: Rt UE lymphedema risk - needs to progress any UE strenthening slowly  WEIGHT BEARING RESTRICTIONS: No  FALLS:  Has patient fallen in last 6 months? No  LIVING ENVIRONMENT: Lives with: lives with their family and lives with their spouse Lives in: House/apartment   OCCUPATION: retired   LEISURE:   HAND DOMINANCE: right   PRIOR LEVEL OF FUNCTION: Independent  PATIENT GOALS: help with numbness and tingling in feet    OBJECTIVE:  COGNITION: Overall cognitive status: Within functional limits for tasks assessed   PALPATION:   OBSERVATIONS / OTHER ASSESSMENTS: Mild swelling in ankles and feet   SENSATION: Light touch: Deficits Right 2 sites felt  / left 5 sites felt    POSTURE: A [place]  UPPER EXTREMITY AROM/PROM:  A/PROM RIGHT   eval  11/18/2021 12/21/21  Shoulder extension  45   Shoulder flexion  89 painful  90 w/pain  Shoulder abduction  82 painful  87 w/pain  Shoulder internal rotation  30 WFL  Shoulder external rotation  60 WFL    (Blank rows = not tested)  LOWER EXTREMITYMMT:  A/PROM Right eval  Hip flexion 4/5  Hip extension   Hip abduction   Hip adduction   Hip internal rotation 3/5  Hip external rotation 4/5  Knee flexion   Knee extension   Ankle dorsiflexion 3/5  Ankle plantarflexion   Ankle inversion   Ankle eversion    (Blank rows = not tested)  A/PROM LEFT eval  Hip flexion 4/5  Hip extension   Hip abduction   Hip adduction   Hip internal rotation 4/5  Hip external rotation 4/5  Knee flexion   Knee extension   Ankle dorsiflexion 3/5  Ankle plantarflexion   Ankle inversion   Ankle eversion    (Blank rows = not tested)  LYMPHEDEMA ASSESSMENTS:   SURGERY TYPE/DATE: 02/10/2021; Right breast lumpectomy and right axillary sentinel Lymph Node biopsy NUMBER OF LYMPH NODES REMOVED: 4 CHEMOTHERAPY: yes RADIATION:yes HORMONE TREATMENT: yes  FUNCTIONAL TESTS:  30 seconds chair stand test- 7  6 minute walk test: 1202 4/10 RPE scale difficulty rating Berg Balance Scale: 41 Grip Strength: R:  20 pounds     L: 45 pounds   GAIT: Distance walked: 1202 feet    TODAY'S TREATMENT:  DATE:  01/25/22 UBE L1, 3 min for/back Attempted STM, too painful and guarded. She declined DN. Performed gentle inf and post glide mobs, gradell x 10 each Educated patient to perform pendulum exercises with 3# weight with some success at relaxation. AAROM using dowel and LUE-shoulder flex, abd, IR/ER, ext-Patient seemed to tolerate this better although still painful Performed quick side to side steps for  balance, performed x 30 sec, required Vc to continue to move quickly. Wall slides into flex and scaption x 10 each, more painful with scaption. MH to R shoulder x 10 minutes for pain relief.  01/14/22 UBE L1, 3 min forward, 3 back MH and IFC to R ant shoulder/pects for pain relief x 12 minutes Very gentle STM to R pects and ant chest, still very tender PROM to R shoulder limited, tolerated shoulder ER in approx 40 degrees of abd Standing wall push ups, tolerated well, able to activate pects and achieve stretch as well.   01/06/22 UBE L2 x 3 min forward and 3 back STM to R pects, very gentle due to severe soreness, stretch to pect, but very limited due to guarding.  Attempted pendulum exercises with RUE, limited success due to guarding Attempted wall slides with towel on R, caused pain at 90, so deferred Doorway stretch to pects Supine bridge with hip abd against G tband Standing side to side step against G tband Standing B heel raise with UE support for balance.  12/21/21 UBE L2 x53mns Shoulder ROM and BERG reassess Leg ext 5# 2x10 HS curls 20# 2x10 Calf stretch 30s  Rows and ext green 2x10 Horizontal abd red x5, x10 yellow CP to R ant chest and MH to neck asnd shoulders for up traps tightness and pain  12/19/21 UBE L1 x 3 min forward/3 min back Balance training, SLS-alternately tapping 4 " step, progressed to multiple taps, then tapping with rotation, no issues Airex, slow march, no issues Quick steps-box step in each direction, then moving upon therapsit command to change directions quickly, no unsteadiness noted. Initiated R shoulder activities, found that she was severely sore and painful. Further assessment of R shoulder-severely tight and painful R pects, supraspinatus TTP. Sh horiz abd only to neutral, shoulder abd < 90 degrees due to pain Provided STM to R pects with stretch and cross friction, measured due to severe pain. Finished with CP to R ant chest and MH to neck  asnd shoulders for up traps tightness and pain  12/10/21 NuStep L5 x65ms  Rows and ext redTB 2x10  STS on airex 2x10 Step ups on airex Side steps on airex Shoulder flexion standing on airex 2# 2x10 Leg ext 5# AW 2x10 HS curls 20# 2x10 Walking on beam    12/01/21 UBE L1 [id number]  Wall slides flexion and abd x10 each  Rows and ext yellowTB 2x10 PROM all directions  Supine shoulder flexion 2x10 1#  Walking on beam forwards and side steps  Side steps over obstacles- CGA STS on airex 2x10  NuStep L5 x6mi68m   11/18/2021: Exercise: Shoulder assessment Shoulder flex AAROM in supine  Shoulder squeeze  Shoulder roll   NeuroReed - balance walking looking side to side  - balance walking looking up and down  - obstacle course walking over hurdles - stepping over obstacles side ways  - single leg stance on foam 2 x 30 secs  -Sit to stand with foam under one foot bil -towel scrunches   11/04/2021: EVAL  PATIENT EDUCATION:  Education details: POC  Person educated: Patient Education method: Explanation Education comprehension: verbalized understanding  HOME EXERCISE PROGRAM: Towel scrunches with toes Scapular retraction Posterior shoulder rolls AAROM flexion seated and supine using opposite UE.    [id number]  ASSESSMENT: CLINICAL IMPRESSION: Patient reports she is scheduled for a bone marrow biopsy in Feb due to her blood counts decreasing.Her pain and spasming in her shoulder continue, very TTP. She seemed to tolerate AAROM better when she was using a dowel and in control of the movement due to fear of pain when therapist tried. Updated HEP to reflect additional ROM exercises as well as adding some balance training.  OBJECTIVE IMPAIRMENTS: decreased balance, decreased mobility, difficulty walking, decreased strength, and impaired sensation.   ACTIVITY LIMITATIONS: carrying, standing, and stairs  PARTICIPATION LIMITATIONS: driving and community activity  PERSONAL FACTORS:  1-2 comorbidities: right breast cancer   are also affecting patient's functional outcome.   REHAB POTENTIAL: Good  CLINICAL DECISION MAKING: Evolving/moderate complexity  EVALUATION COMPLEXITY: Moderate  GOALS: Goals reviewed with patient? Yes  LONG TERM GOALS: Target date: 02/01/2022  Pt will be independent with advanced HEP to maintain improvements made throughout therapy Baseline: Goal status: INITIAL  2.  Patient will increase 30 second sit to stands to 12 to show an increase in functional strength  Baseline: 7, 9-12/19/23 Goal status: IN PROGRESS  3.  Patient will increase berg balance score from a 41 to a 50. Baseline: 41, 50/56-12/19/23 Goal status: MET  4.  Patient will increase all LE strength to at least a 4/5 or greater Baseline:  Goal status: ongoing  5. Patient will increase R shoulder ROM to [facility]  Goal status: ongoing  PLAN:  PT FREQUENCY: 2x/week  PT DURATION: 6 weeks  PLANNED INTERVENTIONS: Therapeutic exercises, Therapeutic activity, Neuromuscular re-education, Balance training, Gait training, Patient/Family education, Self Care, Joint mobilization, Dry Needling, Cryotherapy, Moist heat, Manual lymph drainage, Compression bandaging, scar mobilization, Taping, Biofeedback, and Manual therapy  PLAN FOR NEXT SESSION: STM to R pect and ant shoulder, stretch, Need to re establish approriate scapulo humeral rhythm.Acute pain modalities for R shoulder pain, strengthening. DN?   Ethel Rana DPT 01/25/22 11:43 AM

## 2022-01-28 DIAGNOSIS — C50911 Malignant neoplasm of unspecified site of right female breast: Secondary | ICD-10-CM | POA: Diagnosis not present

## 2022-02-01 ENCOUNTER — Ambulatory Visit: Payer: Medicare PPO | Admitting: Physical Therapy

## 2022-02-01 ENCOUNTER — Encounter: Payer: Self-pay | Admitting: Physical Therapy

## 2022-02-01 DIAGNOSIS — M6281 Muscle weakness (generalized): Secondary | ICD-10-CM

## 2022-02-01 DIAGNOSIS — R209 Unspecified disturbances of skin sensation: Secondary | ICD-10-CM

## 2022-02-01 DIAGNOSIS — M25611 Stiffness of right shoulder, not elsewhere classified: Secondary | ICD-10-CM

## 2022-02-01 DIAGNOSIS — Z483 Aftercare following surgery for neoplasm: Secondary | ICD-10-CM | POA: Diagnosis not present

## 2022-02-01 DIAGNOSIS — R293 Abnormal posture: Secondary | ICD-10-CM

## 2022-02-01 NOTE — Therapy (Addendum)
OUTPATIENT PHYSICAL THERAPY ONCOLOGY TREATMENT   Progress Note Reporting Period 11/04/21 to 02/01/22  See note below for Objective Data and Assessment of Progress/Goals.     Patient Name: Nelda Luckey MRN: [id number] DOB:[birth year], 67 y.o., female Today's Date: 02/01/2022   PT End of Session - 02/01/22 1148     Visit Number 10    Date for PT Re-Evaluation 03/08/22    PT Start Time 1103    PT Stop Time 1145    PT Time Calculation (min) 42 min    Activity Tolerance Patient tolerated treatment well    Behavior During Therapy WFL for tasks assessed/performed                   Past Medical History:  Diagnosis Date   Anxiety    Cancer ([place])    right breast cancer   High cholesterol    History of kidney stones    Hypertension    Hypothyroidism    Past Surgical History:  Procedure Laterality Date   ABDOMINAL HYSTERECTOMY     AXILLARY SENTINEL NODE BIOPSY Right 03/02/2021   Procedure: RIGHT AXILLARY SENTINEL NODE BIOPSY;  Surgeon: Donnie Mesa, MD;  Location: [place];  Service: General;  Laterality: Right;   BREAST LUMPECTOMY WITH RADIOACTIVE SEED AND SENTINEL LYMPH NODE BIOPSY Right 03/02/2021   Procedure: RIGHT BREAST LUMPECTOMY WITH RADIOACTIVE SEED;  Surgeon: Donnie Mesa, MD;  Location: [place];  Service: General;  Laterality: Right;   KIDNEY SURGERY Right    NECK SURGERY     PORTACATH PLACEMENT N/A 03/31/2021   Procedure: INSERTION PORT-A-CATH;  Surgeon: Donnie Mesa, MD;  Location: [place];  Service: General;  Laterality: N/A;   RE-EXCISION OF BREAST LUMPECTOMY Right 03/31/2021   Procedure: RE-EXCISION OF RIGHT BREAST LUMPECTOMY MARGINS;  Surgeon: Donnie Mesa, MD;  Location: [place];  Service: General;  Laterality: Right;   WISDOM TOOTH EXTRACTION     Patient Active Problem List   Diagnosis Date Noted   Neutropenia ([place]) 01/21/2022   Hypothyroidism 06/04/2021   Dyslipidemia 06/04/2021   Moderate recurrent major depression ([place]) 06/04/2021   Vitamin D  deficiency 06/04/2021   Other specified disorders of bone density and structure, other site 06/04/2021   Malignant neoplasm of 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   PAIN:  Are you having pain? Yes NPRS scale: 5/10 Pain location: right shoulder Pain orientation: Right  PAIN TYPE: aching and dull Pain description: constant   PRECAUTIONS: Rt UE lymphedema risk - needs to progress any UE strenthening slowly  WEIGHT BEARING RESTRICTIONS: No  FALLS:  Has patient fallen in last 6 months? No  LIVING ENVIRONMENT: Lives with: lives with their family and lives with their spouse Lives in: House/apartment   OCCUPATION: retired   LEISURE:   HAND DOMINANCE: right   PRIOR LEVEL OF FUNCTION: Independent  PATIENT GOALS: help with numbness and tingling in feet    OBJECTIVE:  COGNITION: Overall cognitive status: Within functional limits for tasks assessed   PALPATION:   OBSERVATIONS / OTHER  ASSESSMENTS: Mild swelling in ankles and feet   SENSATION: Light touch: Deficits Right 2 sites felt  / left 5 sites felt   POSTURE: [facility]  UPPER EXTREMITY AROM/PROM:  A/PROM RIGHT   eval  11/18/2021 12/21/21 02/01/22  Shoulder extension  45    Shoulder flexion  89 painful  90 w/pain A103, P115, pain  Shoulder abduction  82 painful  87 w/pain A 87, P95 pain  Shoulder internal rotation  30 WFL 65, pain  Shoulder external rotation  60 WFL WFL    (Blank rows = not tested)  LOWER EXTREMITYMMT:  A/PROM Right eval  Hip flexion 4/5  Hip extension   Hip abduction   Hip adduction   Hip internal rotation 3/5  Hip external rotation 4/5  Knee flexion   Knee extension   Ankle dorsiflexion 3/5  Ankle plantarflexion   Ankle inversion   Ankle eversion    (Blank rows = not tested)  A/PROM LEFT eval  Hip flexion 4/5  Hip extension   Hip abduction   Hip adduction   Hip internal rotation 4/5  Hip external rotation 4/5  Knee flexion   Knee extension   Ankle dorsiflexion 3/5  Ankle plantarflexion   Ankle inversion   Ankle eversion    (Blank rows = not tested)  LYMPHEDEMA ASSESSMENTS:   SURGERY TYPE/DATE: 02/10/2021; Right breast lumpectomy and right axillary sentinel Lymph Node biopsy NUMBER OF LYMPH NODES REMOVED: 4 CHEMOTHERAPY: yes RADIATION:yes HORMONE TREATMENT: yes  FUNCTIONAL TESTS:  30 seconds chair stand test- 7  6 minute walk test: 1202 4/10 RPE scale difficulty rating Berg Balance Scale: 41 Grip Strength: R:  20 pounds     L: 45 pounds   GAIT: Distance walked: 1202 feet    TODAY'S TREATMENT:  DATE:  02/01/22 Gentle MH to R axilla and upper arm STM-Stretching for axillary cording, pect cross friction massage PROM for R shoulder R humeral joint mobs for depression, post glide, grade lll, 2 x 10 Active stretch with diagonal  reaching in L sidelie Functional re-assessment for PN CP to R shoulder for pain and inflammation relief.  01/25/22 UBE L1, 3 min for/back Attempted STM, too painful and guarded. She declined DN. Performed gentle inf and post glide mobs, gradell x 10 each Educated patient to perform pendulum exercises with 3# weight with some success at relaxation. AAROM using dowel and LUE-shoulder flex, abd, IR/ER, ext-Patient seemed to tolerate this better although still painful Performed quick side to side steps for balance, performed x 30 sec, required Vc to continue to move quickly. Wall slides into flex and scaption x 10 each, more painful with scaption. MH to R shoulder x 10 minutes for pain relief.  01/14/22 UBE L1, 3 min forward, 3 back MH and IFC to R ant shoulder/pects for pain relief x 12 minutes Very gentle STM to R pects and ant chest, still very tender PROM to R shoulder limited, tolerated shoulder ER in approx 40 degrees of abd Standing wall push ups, tolerated well, able to activate pects and achieve stretch as well.   01/06/22 UBE L2 x 3 min forward and 3 back STM to R pects, very gentle due to severe soreness, stretch to pect, but very limited due to guarding.  Attempted pendulum exercises with RUE, limited success due to guarding Attempted wall slides with towel on R, caused pain at 90, so deferred Doorway stretch to pects Supine bridge with hip abd against G tband Standing side to side step against G tband Standing B heel raise with UE support for balance.  12/21/21 UBE L2 x23mns Shoulder ROM and BERG reassess Leg ext 5# 2x10 HS curls 20# 2x10 Calf stretch 30s  Rows and ext green 2x10 Horizontal abd red x5, x10 yellow CP to R ant chest and MH to neck asnd shoulders for up traps tightness and pain  12/19/21 UBE L1 x 3 min forward/3 min back Balance training, SLS-alternately tapping 4 " step, progressed to multiple taps, then tapping with rotation, no issues Airex, slow  march, no issues Quick steps-box step in each direction, then moving upon therapsit command to change directions quickly, no unsteadiness noted. Initiated R shoulder activities, found that she was severely sore and painful. Further assessment of R shoulder-severely tight and painful R pects, supraspinatus TTP. Sh horiz abd only to neutral, shoulder abd < 90 degrees due to pain Provided STM to R pects with stretch and cross friction, measured due to severe pain. Finished with CP to R ant chest and MH to neck asnd shoulders for up traps tightness and pain  12/10/21 NuStep L5 x62ms  Rows and ext redTB 2x10  STS on airex 2x10 Step ups on airex Side steps on airex Shoulder flexion standing on airex 2# 2x10 Leg ext 5# AW 2x10 HS curls 20# 2x10 Walking on beam    12/01/21 UBE L1 [id number]  Wall slides flexion and abd x10 each  Rows and ext yellowTB 2x10 PROM all directions  Supine shoulder flexion 2x10 1#  Walking on beam forwards and side steps  Side steps over obstacles- CGA STS on airex 2x10  NuStep L5 [id number]   11/18/2021: Exercise: Shoulder assessment Shoulder flex AAROM in supine  Shoulder squeeze  Shoulder roll   NeuroReed - balance walking looking side to  side  - balance walking looking up and down  - obstacle course walking over hurdles - stepping over obstacles side ways  - single leg stance on foam 2 x 30 secs  -Sit to stand with foam under one foot bil -towel scrunches   11/04/2021: EVAL  PATIENT EDUCATION:  Education details: POC Person educated: Patient Education method: Explanation Education comprehension: verbalized understanding  HOME EXERCISE PROGRAM: Towel scrunches with toes Scapular retraction Posterior shoulder rolls AAROM flexion seated and supine using opposite UE.    [id number]  ASSESSMENT: CLINICAL IMPRESSION: Patient reports she is scheduled for a bone marrow biopsy in Feb due to her blood counts decreasing.Her pain and spasming in her  shoulder continue, very TTP. She saw the Dr and was found to have a cyst in her R armpit. She is on antibiotics and was able to express the cyst to release some tension and relieve pain. Her R shoulder pain and ROM limitations have been the focus of treatment as this is impeding her function. She has a lot of scar tissue and tightness in R chest wall, responding slowly to treatment. Performing her HEP. She is at high risk for frozen shoulder if she stops therapy at this point.  OBJECTIVE IMPAIRMENTS: decreased balance, decreased mobility, difficulty walking, decreased strength, and impaired sensation.   ACTIVITY LIMITATIONS: carrying, standing, and stairs  PARTICIPATION LIMITATIONS: driving and community activity  PERSONAL FACTORS: 1-2 comorbidities: right breast cancer   are also affecting patient's functional outcome.   REHAB POTENTIAL: Good  CLINICAL DECISION MAKING: Evolving/moderate complexity  EVALUATION COMPLEXITY: Moderate  GOALS: Goals reviewed with patient? Yes  LONG TERM GOALS: Target date: 03/08/2022  Pt will be independent with advanced HEP to maintain improvements made throughout therapy Baseline: Goal status: ongoing  2.  Patient will increase 30 second sit to stands to 12 to show an increase in functional strength  Baseline: 7, 9-12/19/23, 10-1/30/24 Goal status: ongoing  3.  Patient will increase berg balance score from a 41 to a 50. Baseline: 41, 50/56-12/19/23 Goal status: MET  4.  Patient will increase all LE strength to at least a 4/5 or greater Baseline:  Goal status: ongoing, 02/01/22  5. Patient will increase R shoulder ROM to [facility]  Goal status: ongoing, 02/01/22-see ROM testing  PLAN:  PT FREQUENCY: 2x/week  PT DURATION: 6 weeks  PLANNED INTERVENTIONS: Therapeutic exercises, Therapeutic activity, Neuromuscular re-education, Balance training, Gait training, Patient/Family education, Self Care, Joint mobilization, Dry Needling, Cryotherapy, Moist heat,  Manual lymph drainage, Compression bandaging, scar mobilization, Taping, Biofeedback, and Manual therapy  PLAN FOR NEXT SESSION: STM to R pect and ant shoulder, stretch, Need to re establish approriate scapulo humeral rhythm.Acute pain modalities for R shoulder pain, strengthening. Assess LBP 02/01/22   Ethel Rana DPT 02/01/22 12:11 PM

## 2022-02-03 DIAGNOSIS — C50911 Malignant neoplasm of unspecified site of right female breast: Secondary | ICD-10-CM | POA: Diagnosis not present

## 2022-02-04 ENCOUNTER — Inpatient Hospital Stay: Payer: Medicare PPO | Attending: Adult Health | Admitting: Hematology and Oncology

## 2022-02-04 ENCOUNTER — Other Ambulatory Visit: Payer: Self-pay

## 2022-02-04 ENCOUNTER — Inpatient Hospital Stay: Payer: Medicare PPO

## 2022-02-04 VITALS — BP 138/85 | HR 59 | Temp 97.7°F | Resp 18

## 2022-02-04 DIAGNOSIS — Z923 Personal history of irradiation: Secondary | ICD-10-CM | POA: Diagnosis not present

## 2022-02-04 DIAGNOSIS — Z170 Estrogen receptor positive status [ER+]: Secondary | ICD-10-CM | POA: Insufficient documentation

## 2022-02-04 DIAGNOSIS — D709 Neutropenia, unspecified: Secondary | ICD-10-CM

## 2022-02-04 DIAGNOSIS — Z79811 Long term (current) use of aromatase inhibitors: Secondary | ICD-10-CM | POA: Insufficient documentation

## 2022-02-04 DIAGNOSIS — C50511 Malignant neoplasm of lower-outer quadrant of right female breast: Secondary | ICD-10-CM | POA: Diagnosis not present

## 2022-02-04 DIAGNOSIS — Z9221 Personal history of antineoplastic chemotherapy: Secondary | ICD-10-CM | POA: Insufficient documentation

## 2022-02-04 LAB — CMP (CANCER CENTER ONLY)
ALT: 11 U/L (ref 0–44)
AST: 14 U/L — ABNORMAL LOW (ref 15–41)
Albumin: 3.7 g/dL (ref 3.5–5.0)
Alkaline Phosphatase: 107 U/L (ref 38–126)
Anion gap: 5 (ref 5–15)
BUN: 15 mg/dL (ref 8–23)
CO2: 28 mmol/L (ref 22–32)
Calcium: 9.4 mg/dL (ref 8.9–10.3)
Chloride: 108 mmol/L (ref 98–111)
Creatinine: 0.83 mg/dL (ref 0.44–1.00)
GFR, Estimated: >60 mL/min (ref >60)
Glucose, Bld: 89 mg/dL (ref 70–99)
Potassium: 4.1 mmol/L (ref 3.5–5.1)
Sodium: 141 mmol/L (ref 135–145)
Total Bilirubin: 0.3 mg/dL (ref 0.3–1.2)
Total Protein: 6.6 g/dL (ref 6.5–8.1)

## 2022-02-04 LAB — CBC WITH DIFFERENTIAL (CANCER CENTER ONLY)
Abs Immature Granulocytes: 0 10*3/uL (ref 0.00–0.07)
Basophils Absolute: 0 10*3/uL (ref 0.0–0.1)
Basophils Relative: 1 %
Eosinophils Absolute: 0 10*3/uL (ref 0.0–0.5)
Eosinophils Relative: 2 %
HCT: 37 % (ref 36.0–46.0)
Hemoglobin: 12.2 g/dL (ref 12.0–15.0)
Immature Granulocytes: 0 %
Lymphocytes Relative: 32 %
Lymphs Abs: 0.8 10*3/uL (ref 0.7–4.0)
MCH: 28.7 pg (ref 26.0–34.0)
MCHC: 33 g/dL (ref 30.0–36.0)
MCV: 87.1 fL (ref 80.0–100.0)
Monocytes Absolute: 0.2 10*3/uL (ref 0.1–1.0)
Monocytes Relative: 10 %
Neutro Abs: 1.3 10*3/uL — ABNORMAL LOW (ref 1.7–7.7)
Neutrophils Relative %: 55 %
Platelet Count: 197 10*3/uL (ref 150–400)
RBC: 4.25 MIL/uL (ref 3.87–5.11)
RDW: 13.6 % (ref 11.5–15.5)
WBC Count: 2.4 10*3/uL — ABNORMAL LOW (ref 4.0–10.5)
nRBC: 0 % (ref 0.0–0.2)

## 2022-02-04 MED ORDER — LIDOCAINE HCL 2 % IJ SOLN
INTRAMUSCULAR | Status: AC
  Filled 2022-02-04: qty 20

## 2022-02-04 MED ORDER — OXYCODONE-ACETAMINOPHEN 5-325 MG PO TABS: 1.0000 | ORAL_TABLET | ORAL | 0 refills | Status: DC | PRN

## 2022-02-04 NOTE — Progress Notes (Signed)
Catherine [name] to lobby in stable condition and dressing CDI.

## 2022-02-04 NOTE — Patient Instructions (Signed)
[facility] Discharge Instructions for Post Bone Marrow Procedure  Today you had a bone marrow biopsy and aspirate of the left hip   Please keep the pressure dressing in place for at least 24 hours.  Have someone check your dressing periodically for bleeding.  If needed you can reapply a pressure dressing to the site.  Take pain medication as directed.  IF BLEEDING REOCCURS THAT SHOULD BE REPORTED IMMEDIATELY. Call the Cancer Center at [phone] if during business hours. Or report to the Emergency Room.        

## 2022-02-04 NOTE — Progress Notes (Signed)
INDICATION: Neutropenia with prior history of breast cancer chemotherapy    Bone Marrow Biopsy and Aspiration Procedure Note   Informed consent was obtained and potential risks including bleeding, infection and pain were reviewed with the patient.  The patient's name, date of birth, identification, consent and allergies were verified prior to the start of procedure and time out was performed.  The left posterior iliac crest was chosen as the site of biopsy.  The skin was prepped with ChloraPrep.   8 cc of 1% lidocaine was used to provide local anaesthesia.   10 cc of bone marrow aspirate was obtained followed by 1cm biopsy.  Pressure was applied to the biopsy site and bandage was placed over the biopsy site. Patient was made to lie on the back for 15 mins prior to discharge.  The procedure was tolerated well. COMPLICATIONS: None BLOOD LOSS: none The patient was discharged home in stable condition with a 1 week follow up to review results.  Patient was provided with post bone marrow biopsy instructions and instructed to call if there was any bleeding or worsening pain.  Specimens sent for flow cytometry, cytogenetics and additional studies.  Signed Harriette Ohara, MD

## 2022-02-08 ENCOUNTER — Encounter: Payer: Self-pay | Admitting: Physical Therapy

## 2022-02-08 ENCOUNTER — Ambulatory Visit: Payer: Medicare PPO | Attending: Hematology and Oncology | Admitting: Physical Therapy

## 2022-02-08 DIAGNOSIS — R209 Unspecified disturbances of skin sensation: Secondary | ICD-10-CM | POA: Diagnosis not present

## 2022-02-08 DIAGNOSIS — M6281 Muscle weakness (generalized): Secondary | ICD-10-CM | POA: Diagnosis not present

## 2022-02-08 DIAGNOSIS — M25611 Stiffness of right shoulder, not elsewhere classified: Secondary | ICD-10-CM

## 2022-02-08 DIAGNOSIS — Z483 Aftercare following surgery for neoplasm: Secondary | ICD-10-CM

## 2022-02-08 DIAGNOSIS — R293 Abnormal posture: Secondary | ICD-10-CM

## 2022-02-08 LAB — SURGICAL PATHOLOGY

## 2022-02-08 NOTE — Therapy (Signed)
OUTPATIENT PHYSICAL THERAPY ONCOLOGY TREATMENT   Progress Note Reporting Period 11/04/21 to 02/01/22  See note below for Objective Data and Assessment of Progress/Goals.     Patient Name: Catherine [name] MRN: [id number] DOB:Aug 13, 1955, 67 y.o., female Today's Date: 02/08/2022   PT End of Session - 02/08/22 1019     Visit Number 11    Date for PT Re-Evaluation 03/08/22    PT Start Time 1017    PT Stop Time 1056    PT Time Calculation (min) 39 min    Activity Tolerance Patient tolerated treatment well    Behavior During Therapy WFL for tasks assessed/performed                    Past Medical History:  Diagnosis Date   Anxiety    Cancer ([place])    right breast cancer   High cholesterol    History of kidney stones    Hypertension    Hypothyroidism    Past Surgical History:  Procedure Laterality Date   ABDOMINAL HYSTERECTOMY     AXILLARY SENTINEL NODE BIOPSY Right 03/02/2021   Procedure: RIGHT AXILLARY SENTINEL NODE BIOPSY;  Surgeon: Donnie Mesa, MD;  Location: [place];  Service: General;  Laterality: Right;   BREAST LUMPECTOMY WITH RADIOACTIVE SEED AND SENTINEL LYMPH NODE BIOPSY Right 03/02/2021   Procedure: RIGHT BREAST LUMPECTOMY WITH RADIOACTIVE SEED;  Surgeon: Donnie Mesa, MD;  Location: [place];  Service: General;  Laterality: Right;   KIDNEY SURGERY Right    NECK SURGERY     PORTACATH PLACEMENT N/A 03/31/2021   Procedure: INSERTION PORT-A-CATH;  Surgeon: Donnie Mesa, MD;  Location: [place];  Service: General;  Laterality: N/A;   RE-EXCISION OF BREAST LUMPECTOMY Right 03/31/2021   Procedure: RE-EXCISION OF RIGHT BREAST LUMPECTOMY MARGINS;  Surgeon: Donnie Mesa, MD;  Location: [place];  Service: General;  Laterality: Right;   WISDOM TOOTH EXTRACTION     Patient Active Problem List   Diagnosis Date Noted   Neutropenia ([place]) 01/21/2022   Hypothyroidism 06/04/2021   Dyslipidemia 06/04/2021   Moderate recurrent major depression ([place]) 06/04/2021   Vitamin D  deficiency 06/04/2021   Other specified disorders of bone density and structure, other site 06/04/2021   Malignant neoplasm of lower-outer quadrant of right breast of female, estrogen receptor positive ([place]) 02/08/2021    PCP: Nicholas Lose, MD  REFERRING PROVIDER: Nicholas Lose, MD  REFERRING DIAG: right breast cancer  THERAPY DIAG:  Aftercare following surgery for neoplasm  Stiffness of right shoulder, not elsewhere classified  Abnormal posture  Unspecified disturbances of skin sensation  Muscle weakness (generalized)  ONSET DATE: June   Rationale for Evaluation and Treatment: Rehabilitation  SUBJECTIVE:  SUBJECTIVE STATEMENT: Patient reports continued back pain, neuropathy pain, shoulder tightness. She did feel more loose after last treatment. She had her bone marrow procedure and will return to Dr on the 12th for the results.  PERTINENT HISTORY: HX of Malignant neoplasm of lower-outer quadrant of right breast of female, estrogen receptor positive ([place]) treated lumpectomy with 1/4 positive lymph nodes on 03/02/21 with chemo and radiation completed.   PAIN:  Are you having pain? Yes NPRS scale: 5/10 Pain location: right shoulder Pain orientation: Right  PAIN TYPE: aching and dull Pain description: constant   PRECAUTIONS: Rt UE lymphedema risk - needs to progress any UE strenthening slowly  WEIGHT BEARING RESTRICTIONS: No  FALLS:  Has patient fallen in last 6 months? No  LIVING ENVIRONMENT: Lives with: lives with their family and lives with their spouse Lives in: House/apartment   OCCUPATION: retired   LEISURE:   HAND DOMINANCE: right   PRIOR LEVEL OF FUNCTION: Independent  PATIENT GOALS: help with numbness and tingling in feet    OBJECTIVE:  COGNITION: Overall  cognitive status: Within functional limits for tasks assessed   PALPATION:   OBSERVATIONS / OTHER ASSESSMENTS: Mild swelling in ankles and feet   SENSATION: Light touch: Deficits Right 2 sites felt  / left 5 sites felt   POSTURE: [facility]  UPPER EXTREMITY AROM/PROM:  A/PROM RIGHT   eval  11/18/2021 12/21/21 02/01/22  Shoulder extension  45    Shoulder flexion  89 painful  90 w/pain A103, P115, pain  Shoulder abduction  82 painful  87 w/pain A 87, P95 pain  Shoulder internal rotation  30 WFL 65, pain  Shoulder external rotation  60 WFL WFL    (Blank rows = not tested)  LOWER EXTREMITYMMT:  A/PROM Right eval  Hip flexion 4/5  Hip extension   Hip abduction   Hip adduction   Hip internal rotation 3/5  Hip external rotation 4/5  Knee flexion   Knee extension   Ankle dorsiflexion 3/5  Ankle plantarflexion   Ankle inversion   Ankle eversion    (Blank rows = not tested)  A/PROM LEFT eval  Hip flexion 4/5  Hip extension   Hip abduction   Hip adduction   Hip internal rotation 4/5  Hip external rotation 4/5  Knee flexion   Knee extension   Ankle dorsiflexion 3/5  Ankle plantarflexion   Ankle inversion   Ankle eversion    (Blank rows = not tested)  LYMPHEDEMA ASSESSMENTS:   SURGERY TYPE/DATE: 02/10/2021; Right breast lumpectomy and right axillary sentinel Lymph Node biopsy NUMBER OF LYMPH NODES REMOVED: 4 CHEMOTHERAPY: yes RADIATION:yes HORMONE TREATMENT: yes  FUNCTIONAL TESTS:  30 seconds chair stand test- 7  6 minute walk test: 1202 4/10 RPE scale difficulty rating Berg Balance Scale: 41 Grip Strength: R:  20 pounds     L: 45 pounds   GAIT: Distance walked: 1202 feet    TODAY'S TREATMENT:  DATE:  02/08/22 UBE L1 x 3 minutes in each direction. LBP assessment-She reports sharp pain in low back. L HS tightness noted, 60 degrees.  She notes pull in hip and low back with KTC, tight in ER and IR. R LE WNL TTP along B SI joints, L piriformis, L gluts, but caution taken due to recent bone marrow biopsy from L hip.  SI assessment- LL-L limb appears longer, pain with L illiac post rotation, improved with repeated stretch, other positions (-) After assess/treatment, LL were equal Supine iso add and abd x 10 reps for SI stability.  Light MH to R ant shoulder and arm x about 6 minutes F/B STM and stretch, emphasizing pects. Very tender to touch and stretch  02/01/22 Gentle MH to R axilla and upper arm STM-Stretching for axillary cording, pect cross friction massage PROM for R shoulder R humeral joint mobs for depression, post glide, grade lll, 2 x 10 Active stretch with diagonal reaching in L sidelie Functional re-assessment for PN CP to R shoulder for pain and inflammation relief.  01/25/22 UBE L1, 3 min for/back Attempted STM, too painful and guarded. She declined DN. Performed gentle inf and post glide mobs, gradell x 10 each Educated patient to perform pendulum exercises with 3# weight with some success at relaxation. AAROM using dowel and LUE-shoulder flex, abd, IR/ER, ext-Patient seemed to tolerate this better although still painful Performed quick side to side steps for balance, performed x 30 sec, required Vc to continue to move quickly. Wall slides into flex and scaption x 10 each, more painful with scaption. MH to R shoulder x 10 minutes for pain relief.  01/14/22 UBE L1, 3 min forward, 3 back MH and IFC to R ant shoulder/pects for pain relief x 12 minutes Very gentle STM to R pects and ant chest, still very tender PROM to R shoulder limited, tolerated shoulder ER in approx 40 degrees of abd Standing wall push ups, tolerated well, able to activate pects and achieve stretch as well.   01/06/22 UBE L2 x 3 min forward and 3 back STM to R pects, very gentle due to severe soreness, stretch to pect, but very limited  due to guarding.  Attempted pendulum exercises with RUE, limited success due to guarding Attempted wall slides with towel on R, caused pain at 90, so deferred Doorway stretch to pects Supine bridge with hip abd against G tband Standing side to side step against G tband Standing B heel raise with UE support for balance.  11/18/2021: Exercise: Shoulder assessment Shoulder flex AAROM in supine  Shoulder squeeze  Shoulder roll   NeuroReed - balance walking looking side to side  - balance walking looking up and down  - obstacle course walking over hurdles - stepping over obstacles side ways  - single leg stance on foam 2 x 30 secs  -Sit to stand with foam under one foot bil -towel scrunches   11/04/2021: EVAL  PATIENT EDUCATION:  Education details: POC Person educated: Patient Education method: Explanation Education comprehension: verbalized understanding  HOME EXERCISE PROGRAM: Towel scrunches with toes Scapular retraction Posterior shoulder rolls AAROM flexion seated and supine using opposite UE.    [id number]  ASSESSMENT: CLINICAL IMPRESSION: Patient reports she had her bone marrow biopsy and will find results on 12th. She reports continued pain in her tailbone area. Therapist provided SI assessment and treatment for anteriorly rotated L illium. Continued with MH and STM/stretch to R ant chest, limited due to time spent treating lower back.  Educated her to try to increase gentle stretch, especially to L LE and continue R shoulder stretch and ROM.  OBJECTIVE IMPAIRMENTS: decreased balance, decreased mobility, difficulty walking, decreased strength, and impaired sensation.   ACTIVITY LIMITATIONS: carrying, standing, and stairs  PARTICIPATION LIMITATIONS: driving and community activity  PERSONAL FACTORS: 1-2 comorbidities: right breast cancer   are also affecting patient's functional outcome.   REHAB POTENTIAL: Good  CLINICAL DECISION MAKING: Evolving/moderate  complexity  EVALUATION COMPLEXITY: Moderate  GOALS: Goals reviewed with patient? Yes  LONG TERM GOALS: Target date: 03/08/2022  Pt will be independent with advanced HEP to maintain improvements made throughout therapy Baseline: Goal status: ongoing  2.  Patient will increase 30 second sit to stands to 12 to show an increase in functional strength  Baseline: 7, 9-12/19/23, 10-1/30/24 Goal status: ongoing  3.  Patient will increase berg balance score from a 41 to a 50. Baseline: 41, 50/56-12/19/23 Goal status: MET  4.  Patient will increase all LE strength to at least a 4/5 or greater Baseline:  Goal status: ongoing, 02/01/22, 02/08/22-ongoing  5. Patient will increase R shoulder ROM to [facility]  Goal status: ongoing, 02/01/22-see ROM testing, 02/08/22-Ongoing  PLAN:  PT FREQUENCY: 2x/week  PT DURATION: 6 weeks  PLANNED INTERVENTIONS: Therapeutic exercises, Therapeutic activity, Neuromuscular re-education, Balance training, Gait training, Patient/Family education, Self Care, Joint mobilization, Dry Needling, Cryotherapy, Moist heat, Manual lymph drainage, Compression bandaging, scar mobilization, Taping, Biofeedback, and Manual therapy  PLAN FOR NEXT SESSION: STM to R pect and ant shoulder, stretch, Need to re establish approriate scapulo humeral rhythm.Acute pain modalities for R shoulder pain, strengthening. Re-assess SI status.   Ethel Rana DPT 02/08/22 11:11 AM

## 2022-02-11 NOTE — Progress Notes (Signed)
Patient Care Team: Nicholas Lose, MD as PCP - General (Hematology and Oncology) Nicholas Lose, MD as Consulting Physician (Hematology and Oncology) Donnie Mesa, MD as Consulting Physician (General Surgery) Kyung Rudd, MD as Consulting Physician (Radiation Oncology)  DIAGNOSIS: No diagnosis found.  SUMMARY OF ONCOLOGIC HISTORY: Oncology History  Malignant neoplasm of lower-outer quadrant of right breast of female, estrogen receptor positive ([place])  01/29/2021 Initial Diagnosis   Screening mammogram detected right breast mass 1.2 cm, axilla normal, biopsy revealed grade 2 IDC with DCIS ER 90%, PR 30%, HER2 2+ by IHC FISH negative   02/08/2021 Cancer Staging   Staging form: Breast, AJCC 8th Edition - Clinical stage from 02/08/2021: Stage IA (cT1c, cN0, cM0, G2, ER+, PR+, HER2: Equivocal) - Signed by Nicholas Lose, MD on 02/08/2021 Stage prefix: Initial diagnosis Histologic grading system: 3 grade system   03/02/2021 Surgery   Right lumpectomy: Grade 2 IDC 1.2 cm with intermediate grade DCIS, focal involvement of anterior medial margin junction, 1/4 lymph nodes positive   03/19/2021 Oncotype testing   Oncotype DX score: 28.  Distant recurrence at 10 years: 17%   03/24/2021 Cancer Staging   Staging form: Breast, AJCC 8th Edition - Pathologic: Stage IA (pT1c, pN1(sn), cM0, G2, ER+, PR+, HER2-, Oncotype DX score: 28) - Signed by Nicholas Lose, MD on 03/24/2021 Method of lymph node assessment: Sentinel lymph node biopsy Multigene prognostic tests performed: Oncotype DX Recurrence score range: Greater than or equal to 11 Histologic grading system: 3 grade system   04/23/2021 - 06/28/2021 Chemotherapy   Patient is on Treatment Plan : BREAST TC q21d     07/28/2021 - 09/13/2021 Radiation Therapy   Site Technique Total Dose (Gy) Dose per Fx (Gy) Completed Fx Beam Energies  Breast, Right: Breast_R 3D 50.4/50.4 1.8 28/28 6X, 10X  Breast, Right: Breast_R_SCLV 3D 50.4/50.4 1.8 28/28 6X, 10X  Breast,  Right: Breast_R_Bst 3D 10/10 2 5/5 6X, 10X     09/2021 -  Anti-estrogen oral therapy   Anastrozole x 7 years     CHIEF COMPLIANT: Follow-up Neutropenia   INTERVAL HISTORY: Catherine [name] is a 67 - year old diagnosed  with Neutropenia she reports to the clinic today for a follow-up. .    ALLERGIES:  is allergic to penicillins.  MEDICATIONS:  Current Outpatient Medications  Medication Sig Dispense Refill   acetaminophen (TYLENOL) 500 MG tablet Take 1,000 mg by mouth every 6 (six) hours as needed for moderate pain.     anastrozole (ARIMIDEX) 1 MG tablet Take 1 tablet (1 mg total) by mouth daily. 90 tablet 3   Calcium Magnesium Zinc 333-133-5 MG TABS Take 1 tablet by mouth daily. (Patient taking differently: Take 1 tablet by mouth once a week.)     diphenhydrAMINE (BENADRYL) 25 MG tablet Take 25 mg by mouth every 6 (six) hours as needed for allergies.     diphenoxylate-atropine (LOMOTIL) 2.5-0.025 MG tablet Take 1 tablet by mouth 4 (four) times daily as needed for diarrhea or loose stools. (Patient not taking: Reported on 12/08/2021) 60 tablet 1   ergocalciferol (VITAMIN D2) 1.25 MG (50000 UT) capsule Take 1 capsule (50,000 Units total) by mouth once a week.     escitalopram (LEXAPRO) 5 MG tablet Take 5 mg by mouth once a week.     gabapentin (NEURONTIN) 300 MG capsule Take 1 capsule (300 mg total) by mouth at bedtime. 90 capsule 3   levothyroxine (SYNTHROID) 50 MCG tablet Take 1 tablet (50 mcg total) by mouth daily before breakfast.  metoprolol tartrate (LOPRESSOR) 25 MG tablet Take 12.5 mg by mouth 2 (two) times daily.     oxyCODONE (OXY IR/ROXICODONE) 5 MG immediate release tablet Take by mouth.     oxyCODONE-acetaminophen (PERCOCET/ROXICET) 5-325 MG tablet Take 1 tablet by mouth every 4 (four) hours as needed for severe pain. 30 tablet 0   rosuvastatin (CRESTOR) 5 MG tablet Take 1 tablet (5 mg total) by mouth daily.     Current Facility-Administered Medications  Medication Dose Route  Frequency Provider Last Rate Last Admin   ondansetron (ZOFRAN) injection 8 mg  8 mg Intravenous Once Nicholas Lose, MD       Facility-Administered Medications Ordered in Other Visits  Medication Dose Route Frequency Provider Last Rate Last Admin   ondansetron (ZOFRAN) 4 MG/2ML injection            ondansetron (ZOFRAN) 4 MG/2ML injection             PHYSICAL EXAMINATION: ECOG PERFORMANCE STATUS: {CHL ONC ECOG PS:[id number]}  There were no vitals filed for this visit. There were no vitals filed for this visit.  BREAST:*** No palpable masses or nodules in either right or left breasts. No palpable axillary supraclavicular or infraclavicular adenopathy no breast tenderness or nipple discharge. (exam performed in the presence of a chaperone)  LABORATORY DATA:  I have reviewed the data as listed    Latest Ref Rng & Units 02/04/2022    8:33 AM 01/21/2022    9:00 AM 12/08/2021    9:29 AM  CMP  Glucose 70 - 99 mg/dL 89  71  98   BUN 8 - 23 mg/dL 15  10  12   $ Creatinine 0.44 - 1.00 mg/dL 0.83  0.91  0.97   Sodium 135 - 145 mmol/L 141  142  140   Potassium 3.5 - 5.1 mmol/L 4.1  4.1  4.5   Chloride 98 - 111 mmol/L 108  107  107   CO2 22 - 32 mmol/L 28  30  29   $ Calcium 8.9 - 10.3 mg/dL 9.4  9.5  10.0   Total Protein 6.5 - 8.1 g/dL 6.6  6.6  7.2   Total Bilirubin 0.3 - 1.2 mg/dL 0.3  0.3  0.6   Alkaline Phos 38 - 126 U/L 107  93  100   AST 15 - 41 U/L 14  13  16   $ ALT 0 - 44 U/L 11  8  12     $ Lab Results  Component Value Date   WBC 2.4 (L) 02/04/2022   HGB 12.2 02/04/2022   HCT 37.0 02/04/2022   MCV 87.1 02/04/2022   PLT 197 02/04/2022   NEUTROABS 1.3 (L) 02/04/2022    ASSESSMENT & PLAN:  No problem-specific Assessment & Plan notes found for this encounter.    No orders of the defined types were placed in this encounter.  The patient has a good understanding of the overall plan. she agrees with it. she will call with any problems that may develop before the next visit  here. Total time spent: 30 mins including face to face time and time spent for planning, charting and co-ordination of care   Suzzette Righter, [place] 02/11/22    I Gardiner Coins am acting as a Education administrator for [employer]  ***

## 2022-02-12 NOTE — Assessment & Plan Note (Signed)
01/29/2021: Screening mammogram detected 1.2 cm right breast mass: Grade 2 IDC with DCIS ER 90%, PR 30%, HER2 negative Oncotype score: 28: 17% risk of distant recurrence at 10 years     Treatment plan: 1.  Breast conserving surgery with sentinel lymph node biopsy  03/02/2021: Right lumpectomy: Grade 2 IDC 1.2 cm, intermediate grade DCIS, focal anterior and medial margin positive, 1/4 lymph nodes positive 03/31/2021: Reexcision margins: Benign 2. Adjuvant chemotherapy with Taxotere and Cytoxan every 3 weeks x4 cycles completed 06/25/2021 3.  Adjuvant radiation 07/30/2021-09/13/2021 4.  Followed by adjuvant antiestrogen therapy with anastrozole started October 2023 ---------------------------------------------------------------------------------------------------------- Anastrozole toxicities: Diffuse musculoskeletal pains    Chemo-induced peripheral neuropathy: Stable Limitation of right shoulder extension:      Neutropenia ([facility]) Lab review: 12/18/2014: WBC 3.2 03/31/2021: WBC 4.1 Chemotherapy with Taxotere and Cytoxan 04/23/2021 -06/25/2021 09/09/2021: WBC 2.1, hemoglobin 12, ANC 1.4, ALC 0.4 12/22/2021: WBC 2.7, hemoglobin 11.8, platelets 203, ANC 1.5, ALC 0.8 01/21/2022: WBC 2.1, ANC 1, hemoglobin 11.9, platelets 193 02/04/22: WBC: 2.4, Hb 12.2, ANC 1.3  Bone Marrow: Mildly hypercellular marrow with Trilineage Hematopoeisis, cytogenetics: Normal 73 XX and FISH for MDS are pending   Discussion: Most likely cause of the is prior chemotherapy.     Abdominal MRI will be done in November to evaluate the renal mass and splenic mass. Return to clinic in June for labs and follow-up.

## 2022-02-14 ENCOUNTER — Inpatient Hospital Stay: Payer: Medicare PPO | Admitting: Hematology and Oncology

## 2022-02-14 ENCOUNTER — Encounter (HOSPITAL_COMMUNITY): Payer: Self-pay | Admitting: Hematology and Oncology

## 2022-02-14 ENCOUNTER — Other Ambulatory Visit: Payer: Self-pay

## 2022-02-14 VITALS — BP 146/83 | HR 72 | Temp 97.3°F | Resp 15 | Wt 161.0 lb

## 2022-02-14 DIAGNOSIS — N2889 Other specified disorders of kidney and ureter: Secondary | ICD-10-CM | POA: Diagnosis not present

## 2022-02-14 DIAGNOSIS — Z170 Estrogen receptor positive status [ER+]: Secondary | ICD-10-CM

## 2022-02-14 DIAGNOSIS — D709 Neutropenia, unspecified: Secondary | ICD-10-CM | POA: Diagnosis not present

## 2022-02-14 DIAGNOSIS — C50511 Malignant neoplasm of lower-outer quadrant of right female breast: Secondary | ICD-10-CM

## 2022-02-14 DIAGNOSIS — Z79811 Long term (current) use of aromatase inhibitors: Secondary | ICD-10-CM | POA: Diagnosis not present

## 2022-02-14 DIAGNOSIS — Z923 Personal history of irradiation: Secondary | ICD-10-CM | POA: Diagnosis not present

## 2022-02-14 DIAGNOSIS — Z9221 Personal history of antineoplastic chemotherapy: Secondary | ICD-10-CM | POA: Diagnosis not present

## 2022-02-14 NOTE — Progress Notes (Signed)
FISH MDS added to pathology from [facility] s/w Amanda in Flow Cytometry to add.

## 2022-02-15 ENCOUNTER — Ambulatory Visit: Payer: Medicare PPO | Admitting: Physical Therapy

## 2022-02-15 ENCOUNTER — Encounter: Payer: Self-pay | Admitting: Physical Therapy

## 2022-02-15 DIAGNOSIS — M25611 Stiffness of right shoulder, not elsewhere classified: Secondary | ICD-10-CM

## 2022-02-15 DIAGNOSIS — M6281 Muscle weakness (generalized): Secondary | ICD-10-CM | POA: Diagnosis not present

## 2022-02-15 DIAGNOSIS — R293 Abnormal posture: Secondary | ICD-10-CM

## 2022-02-15 DIAGNOSIS — R209 Unspecified disturbances of skin sensation: Secondary | ICD-10-CM | POA: Diagnosis not present

## 2022-02-15 DIAGNOSIS — Z483 Aftercare following surgery for neoplasm: Secondary | ICD-10-CM | POA: Diagnosis not present

## 2022-02-15 NOTE — Therapy (Signed)
OUTPATIENT PHYSICAL THERAPY ONCOLOGY TREATMENT   Progress Note Reporting Period 11/04/21 to 02/01/22  See note below for Objective Data and Assessment of Progress/Goals.     Patient Name: Catherine [name] MRN: [id number] DOB:Sep 02, 1955, 66 y.o., female Today's Date: 02/15/2022   PT End of Session - 02/15/22 0935     Visit Number 12    Date for PT Re-Evaluation 03/08/22    PT Start Time 0932    PT Stop Time 1010    PT Time Calculation (min) 38 min    Activity Tolerance Patient tolerated treatment well    Behavior During Therapy WFL for tasks assessed/performed                     Past Medical History:  Diagnosis Date   Anxiety    Cancer ([place])    right breast cancer   High cholesterol    History of kidney stones    Hypertension    Hypothyroidism    Past Surgical History:  Procedure Laterality Date   ABDOMINAL HYSTERECTOMY     AXILLARY SENTINEL NODE BIOPSY Right 03/02/2021   Procedure: RIGHT AXILLARY SENTINEL NODE BIOPSY;  Surgeon: Donnie Mesa, MD;  Location: [place];  Service: General;  Laterality: Right;   BREAST LUMPECTOMY WITH RADIOACTIVE SEED AND SENTINEL LYMPH NODE BIOPSY Right 03/02/2021   Procedure: RIGHT BREAST LUMPECTOMY WITH RADIOACTIVE SEED;  Surgeon: Donnie Mesa, MD;  Location: [place];  Service: General;  Laterality: Right;   KIDNEY SURGERY Right    NECK SURGERY     PORTACATH PLACEMENT N/A 03/31/2021   Procedure: INSERTION PORT-A-CATH;  Surgeon: Donnie Mesa, MD;  Location: [place];  Service: General;  Laterality: N/A;   RE-EXCISION OF BREAST LUMPECTOMY Right 03/31/2021   Procedure: RE-EXCISION OF RIGHT BREAST LUMPECTOMY MARGINS;  Surgeon: Donnie Mesa, MD;  Location: [place];  Service: General;  Laterality: Right;   WISDOM TOOTH EXTRACTION     Patient Active Problem List   Diagnosis Date Noted   Neutropenia ([place]) 01/21/2022   Hypothyroidism 06/04/2021   Dyslipidemia 06/04/2021   Moderate recurrent major depression ([place]) 06/04/2021   Vitamin D  deficiency 06/04/2021   Other specified disorders of bone density and structure, other site 06/04/2021   Malignant neoplasm of lower-outer quadrant of right breast of female, estrogen receptor positive ([place]) 02/08/2021    PCP: Nicholas Lose, MD  REFERRING PROVIDER: Nicholas Lose, MD  REFERRING DIAG: right breast cancer  THERAPY DIAG:  Aftercare following surgery for neoplasm  Stiffness of right shoulder, not elsewhere classified  Abnormal posture  Muscle weakness (generalized)  Unspecified disturbances of skin sensation  ONSET DATE: June   Rationale for Evaluation and Treatment: Rehabilitation  SUBJECTIVE:  SUBJECTIVE STATEMENT: Patient reports that her bone marrow results were negative. The Dr performed some additional tests. She was very sore all over on Sunday, possibly due to the weather.  PERTINENT HISTORY: HX of Malignant neoplasm of lower-outer quadrant of right breast of female, estrogen receptor positive ([place]) treated lumpectomy with 1/4 positive lymph nodes on 03/02/21 with chemo and radiation completed.   PAIN:  Are you having pain? Yes NPRS scale: 5/10 Pain location: right shoulder Pain orientation: Right  PAIN TYPE: aching and dull Pain description: constant   PRECAUTIONS: Rt UE lymphedema risk - needs to progress any UE strenthening slowly  WEIGHT BEARING RESTRICTIONS: No  FALLS:  Has patient fallen in last 6 months? No  LIVING ENVIRONMENT: Lives with: lives with their family and lives with their spouse Lives in: House/apartment   OCCUPATION: retired   LEISURE:   HAND DOMINANCE: right   PRIOR LEVEL OF FUNCTION: Independent  PATIENT GOALS: help with numbness and tingling in feet    OBJECTIVE:  COGNITION: Overall cognitive status: Within functional limits  for tasks assessed   PALPATION:   OBSERVATIONS / OTHER ASSESSMENTS: Mild swelling in ankles and feet   SENSATION: Light touch: Deficits Right 2 sites felt  / left 5 sites felt   POSTURE: [facility]  UPPER EXTREMITY AROM/PROM:  A/PROM RIGHT   eval  11/18/2021 12/21/21 02/01/22  Shoulder extension  45    Shoulder flexion  89 painful  90 w/pain A103, P115, pain  Shoulder abduction  82 painful  87 w/pain A 87, P95 pain  Shoulder internal rotation  30 WFL 65, pain  Shoulder external rotation  60 WFL WFL    (Blank rows = not tested)  LOWER EXTREMITYMMT:  A/PROM Right eval  Hip flexion 4/5  Hip extension   Hip abduction   Hip adduction   Hip internal rotation 3/5  Hip external rotation 4/5  Knee flexion   Knee extension   Ankle dorsiflexion 3/5  Ankle plantarflexion   Ankle inversion   Ankle eversion    (Blank rows = not tested)  A/PROM LEFT eval  Hip flexion 4/5  Hip extension   Hip abduction   Hip adduction   Hip internal rotation 4/5  Hip external rotation 4/5  Knee flexion   Knee extension   Ankle dorsiflexion 3/5  Ankle plantarflexion   Ankle inversion   Ankle eversion    (Blank rows = not tested)  LYMPHEDEMA ASSESSMENTS:   SURGERY TYPE/DATE: 02/10/2021; Right breast lumpectomy and right axillary sentinel Lymph Node biopsy NUMBER OF LYMPH NODES REMOVED: 4 CHEMOTHERAPY: yes RADIATION:yes HORMONE TREATMENT: yes  FUNCTIONAL TESTS:  30 seconds chair stand test- 7  6 minute walk test: 1202 4/10 RPE scale difficulty rating Berg Balance Scale: 41 Grip Strength: R:  20 pounds     L: 45 pounds   GAIT: Distance walked: 1202 feet    TODAY'S TREATMENT:  DATE:  02/15/22 UBE L1, 3 min forward, 3 back. SI assessment, L side in mild post rotation. ME to re-position with improved equality after Supine iso hip abd/add for pelvic  stability. STM to R ant chest and shoulder, very TTP and tight Post/Inf joint mobs to L humerus grade iii, 2 x 10 AAROM-flex 110, abd 86 AAROM to R shoulder using dowel x 5 in flex and abd.  02/08/22 UBE L1 x 3 minutes in each direction. LBP assessment-She reports sharp pain in low back. L HS tightness noted, 60 degrees. She notes pull in hip and low back with KTC, tight in ER and IR. R LE WNL TTP along B SI joints, L piriformis, L gluts, but caution taken due to recent bone marrow biopsy from L hip.  SI assessment- LL-L limb appears longer, pain with L illiac post rotation, improved with repeated stretch, other positions (-) After assess/treatment, LL were equal Supine iso add and abd x 10 reps for SI stability.  Light MH to R ant shoulder and arm x about 6 minutes F/B STM and stretch, emphasizing pects. Very tender to touch and stretch  02/01/22 Gentle MH to R axilla and upper arm STM-Stretching for axillary cording, pect cross friction massage PROM for R shoulder R humeral joint mobs for depression, post glide, grade lll, 2 x 10 Active stretch with diagonal reaching in L sidelie Functional re-assessment for PN CP to R shoulder for pain and inflammation relief.  01/25/22 UBE L1, 3 min for/back Attempted STM, too painful and guarded. She declined DN. Performed gentle inf and post glide mobs, gradell x 10 each Educated patient to perform pendulum exercises with 3# weight with some success at relaxation. AAROM using dowel and LUE-shoulder flex, abd, IR/ER, ext-Patient seemed to tolerate this better although still painful Performed quick side to side steps for balance, performed x 30 sec, required Vc to continue to move quickly. Wall slides into flex and scaption x 10 each, more painful with scaption. MH to R shoulder x 10 minutes for pain relief.  01/14/22 UBE L1, 3 min forward, 3 back MH and IFC to R ant shoulder/pects for pain relief x 12 minutes Very gentle STM to R pects and ant  chest, still very tender PROM to R shoulder limited, tolerated shoulder ER in approx 40 degrees of abd Standing wall push ups, tolerated well, able to activate pects and achieve stretch as well.   01/06/22 UBE L2 x 3 min forward and 3 back STM to R pects, very gentle due to severe soreness, stretch to pect, but very limited due to guarding.  Attempted pendulum exercises with RUE, limited success due to guarding Attempted wall slides with towel on R, caused pain at 90, so deferred Doorway stretch to pects Supine bridge with hip abd against G tband Standing side to side step against G tband Standing B heel raise with UE support for balance.  11/18/2021: Exercise: Shoulder assessment Shoulder flex AAROM in supine  Shoulder squeeze  Shoulder roll   NeuroReed - balance walking looking side to side  - balance walking looking up and down  - obstacle course walking over hurdles - stepping over obstacles side ways  - single leg stance on foam 2 x 30 secs  -Sit to stand with foam under one foot bil -towel scrunches   11/04/2021: EVAL  PATIENT EDUCATION:  Education details: POC Person educated: Patient Education method: Explanation Education comprehension: verbalized understanding  HOME EXERCISE PROGRAM: Towel scrunches with toes Scapular retraction  Posterior shoulder rolls AAROM flexion seated and supine using opposite UE.    [id number]  ASSESSMENT: CLINICAL IMPRESSION: Patient reports her bone marrow biopsy came back normal. Re-assessed her SI which appears to be in line. Her R shoulder remains very thight and very TTP. Therapist introduced some additional AAROM exercises and encouraged her to perform multiple times per day to try to get some mobility into those muscles.  OBJECTIVE IMPAIRMENTS: decreased balance, decreased mobility, difficulty walking, decreased strength, and impaired sensation.   ACTIVITY LIMITATIONS: carrying, standing, and stairs  PARTICIPATION LIMITATIONS:  driving and community activity  PERSONAL FACTORS: 1-2 comorbidities: right breast cancer   are also affecting patient's functional outcome.   REHAB POTENTIAL: Good  CLINICAL DECISION MAKING: Evolving/moderate complexity  EVALUATION COMPLEXITY: Moderate  GOALS: Goals reviewed with patient? Yes  LONG TERM GOALS: Target date: 03/08/2022  Pt will be independent with advanced HEP to maintain improvements made throughout therapy Baseline: Goal status: ongoing  2.  Patient will increase 30 second sit to stands to 12 to show an increase in functional strength  Baseline: 7, 9-12/19/23, 10-1/30/24 Goal status: ongoing  3.  Patient will increase berg balance score from a 41 to a 50. Baseline: 41, 50/56-12/19/23 Goal status: MET  4.  Patient will increase all LE strength to at least a 4/5 or greater Baseline:  Goal status: ongoing, 02/01/22, 02/08/22-ongoing  5. Patient will increase R shoulder ROM to [facility]  Goal status: ongoing, 02/01/22-see ROM testing, 02/08/22-Ongoing  PLAN:  PT FREQUENCY: 2x/week  PT DURATION: 6 weeks  PLANNED INTERVENTIONS: Therapeutic exercises, Therapeutic activity, Neuromuscular re-education, Balance training, Gait training, Patient/Family education, Self Care, Joint mobilization, Dry Needling, Cryotherapy, Moist heat, Manual lymph drainage, Compression bandaging, scar mobilization, Taping, Biofeedback, and Manual therapy  PLAN FOR NEXT SESSION: STM to R pect and ant shoulder, stretch, Need to re establish approriate scapulo humeral rhythm.Acute pain modalities for R shoulder pain, strengthening. Re-assess SI status.   Ethel Rana DPT 02/15/22 11:55 AM

## 2022-02-22 ENCOUNTER — Encounter (HOSPITAL_COMMUNITY): Payer: Self-pay | Admitting: Hematology and Oncology

## 2022-02-25 DIAGNOSIS — C50911 Malignant neoplasm of unspecified site of right female breast: Secondary | ICD-10-CM | POA: Diagnosis not present

## 2022-02-28 ENCOUNTER — Ambulatory Visit: Payer: Medicare PPO | Admitting: Physical Therapy

## 2022-03-03 ENCOUNTER — Ambulatory Visit
Admission: RE | Admit: 2022-03-03 | Discharge: 2022-03-03 | Disposition: A | Discharge status: Home / Self Care | Payer: Medicare PPO | Source: Ambulatory Visit | Attending: Adult Health | Admitting: Adult Health

## 2022-03-03 ENCOUNTER — Other Ambulatory Visit: Payer: Self-pay | Admitting: Adult Health

## 2022-03-03 DIAGNOSIS — C50511 Malignant neoplasm of lower-outer quadrant of right female breast: Secondary | ICD-10-CM

## 2022-03-03 DIAGNOSIS — N6489 Other specified disorders of breast: Secondary | ICD-10-CM | POA: Diagnosis not present

## 2022-03-03 DIAGNOSIS — R928 Other abnormal and inconclusive findings on diagnostic imaging of breast: Secondary | ICD-10-CM | POA: Diagnosis not present

## 2022-03-08 ENCOUNTER — Encounter: Payer: Self-pay | Admitting: Physical Therapy

## 2022-03-08 ENCOUNTER — Ambulatory Visit: Payer: Medicare PPO | Attending: Hematology and Oncology | Admitting: Physical Therapy

## 2022-03-08 DIAGNOSIS — M6281 Muscle weakness (generalized): Secondary | ICD-10-CM | POA: Diagnosis not present

## 2022-03-08 DIAGNOSIS — M25611 Stiffness of right shoulder, not elsewhere classified: Secondary | ICD-10-CM

## 2022-03-08 DIAGNOSIS — R209 Unspecified disturbances of skin sensation: Secondary | ICD-10-CM

## 2022-03-08 DIAGNOSIS — R293 Abnormal posture: Secondary | ICD-10-CM | POA: Diagnosis not present

## 2022-03-08 DIAGNOSIS — Z483 Aftercare following surgery for neoplasm: Secondary | ICD-10-CM | POA: Diagnosis not present

## 2022-03-08 NOTE — Therapy (Signed)
OUTPATIENT PHYSICAL THERAPY ONCOLOGY TREATMENT   Progress Note Reporting Period 11/04/21 to 02/01/22  See note below for Objective Data and Assessment of Progress/Goals.     Patient Name: Catherine [name] MRN: [id number] DOB:[birth year], 67 y.o., female Today's Date: 03/08/2022   PT End of Session - 03/08/22 1003     Visit Number 69    PT Start Time 0928    PT Stop Time [id number]    PT Time Calculation (min) 30 min    Activity Tolerance Patient tolerated treatment well    Behavior During Therapy WFL for tasks assessed/performed                      Past Medical History:  Diagnosis Date   Anxiety    Cancer ([place])    right breast cancer   High cholesterol    History of kidney stones    Hypertension    Hypothyroidism    Past Surgical History:  Procedure Laterality Date   ABDOMINAL HYSTERECTOMY     AXILLARY SENTINEL NODE BIOPSY Right 03/02/2021   Procedure: RIGHT AXILLARY SENTINEL NODE BIOPSY;  Surgeon: Donnie Mesa, MD;  Location: [place];  Service: General;  Laterality: Right;   BREAST LUMPECTOMY WITH RADIOACTIVE SEED AND SENTINEL LYMPH NODE BIOPSY Right 03/02/2021   Procedure: RIGHT BREAST LUMPECTOMY WITH RADIOACTIVE SEED;  Surgeon: Donnie Mesa, MD;  Location: [place];  Service: General;  Laterality: Right;   KIDNEY SURGERY Right    NECK SURGERY     PORTACATH PLACEMENT N/A 03/31/2021   Procedure: INSERTION PORT-A-CATH;  Surgeon: Donnie Mesa, MD;  Location: [place];  Service: General;  Laterality: N/A;   RE-EXCISION OF BREAST LUMPECTOMY Right 03/31/2021   Procedure: RE-EXCISION OF RIGHT BREAST LUMPECTOMY MARGINS;  Surgeon: Donnie Mesa, MD;  Location: [place];  Service: General;  Laterality: Right;   WISDOM TOOTH EXTRACTION     Patient Active Problem List   Diagnosis Date Noted   Neutropenia ([place]) 01/21/2022   Hypothyroidism 06/04/2021   Dyslipidemia 06/04/2021   Moderate recurrent major depression ([place]) 06/04/2021   Vitamin D deficiency 06/04/2021   Other  specified disorders of bone density and structure, other site 06/04/2021   Malignant neoplasm of lower-outer quadrant of right breast of female, estrogen receptor positive ([place]) 02/08/2021    PCP: Nicholas Lose, MD  REFERRING PROVIDER: Nicholas Lose, MD  REFERRING DIAG: right breast cancer  THERAPY DIAG:  Aftercare following surgery for neoplasm  Stiffness of right shoulder, not elsewhere classified  Abnormal posture  Unspecified disturbances of skin sensation  Muscle weakness (generalized)  ONSET DATE: June   Rationale for Evaluation and Treatment: Rehabilitation  SUBJECTIVE:  SUBJECTIVE STATEMENT: Patient reports that she missed a few treatments. She feels like she is progressing well.  PERTINENT HISTORY: HX of Malignant neoplasm of lower-outer quadrant of right breast of female, estrogen receptor positive ([place]) treated lumpectomy with 1/4 positive lymph nodes on 03/02/21 with chemo and radiation completed.   PAIN:  Are you having pain? Yes NPRS scale: 5/10 Pain location: right shoulder Pain orientation: Right  PAIN TYPE: aching and dull Pain description: constant   PRECAUTIONS: Rt UE lymphedema risk - needs to progress any UE strenthening slowly  WEIGHT BEARING RESTRICTIONS: No  FALLS:  Has patient fallen in last 6 months? No  LIVING ENVIRONMENT: Lives with: lives with their family and lives with their spouse Lives in: House/apartment   OCCUPATION: retired   LEISURE:   HAND DOMINANCE: right   PRIOR LEVEL OF FUNCTION: Independent  PATIENT GOALS: help with numbness and tingling in feet    OBJECTIVE:  COGNITION: Overall cognitive status: Within functional limits for tasks assessed   PALPATION:   OBSERVATIONS / OTHER ASSESSMENTS: Mild swelling in ankles and feet    SENSATION: Light touch: Deficits Right 2 sites felt  / left 5 sites felt   POSTURE: [facility]  UPPER EXTREMITY AROM/PROM:  A/PROM RIGHT   eval  11/18/2021 12/21/21 02/01/22 03/08/22  Shoulder extension  45     Shoulder flexion  89 painful  90 w/pain A103, P115, pain A118, P136  Shoulder abduction  82 painful  87 w/pain A 87, P95 pain A106, P112  Shoulder internal rotation  30 WFL 65, pain 88  Shoulder external rotation  60 WFL WFL WFL    (Blank rows = not tested)  LOWER EXTREMITYMMT:  A/PROM Right eval  Hip flexion 4/5  Hip extension   Hip abduction   Hip adduction   Hip internal rotation 3/5  Hip external rotation 4/5  Knee flexion   Knee extension   Ankle dorsiflexion 3/5  Ankle plantarflexion   Ankle inversion   Ankle eversion    (Blank rows = not tested)  A/PROM LEFT eval  Hip flexion 4/5  Hip extension   Hip abduction   Hip adduction   Hip internal rotation 4/5  Hip external rotation 4/5  Knee flexion   Knee extension   Ankle dorsiflexion 3/5  Ankle plantarflexion   Ankle inversion   Ankle eversion    (Blank rows = not tested)  LYMPHEDEMA ASSESSMENTS:   SURGERY TYPE/DATE: 02/10/2021; Right breast lumpectomy and right axillary sentinel Lymph Node biopsy NUMBER OF LYMPH NODES REMOVED: 4 CHEMOTHERAPY: yes RADIATION:yes HORMONE TREATMENT: yes  FUNCTIONAL TESTS:  30 seconds chair stand test- 7  6 minute walk test: 1202 4/10 RPE scale difficulty rating Berg Balance Scale: 41 Grip Strength: R:  20 pounds     L: 45 pounds   GAIT: Distance walked: 1202 feet    TODAY'S TREATMENT:  DATE:  03/08/22 Goals re-assessed, educated to continued progress on her own.  02/15/22 UBE L1, 3 min forward, 3 back. SI assessment, L side in mild post rotation. ME to re-position with improved equality after Supine iso hip abd/add for pelvic  stability. STM to R ant chest and shoulder, very TTP and tight Post/Inf joint mobs to L humerus grade iii, 2 x 10 AAROM-flex 110, abd 86 AAROM to R shoulder using dowel x 5 in flex and abd.  02/08/22 UBE L1 x 3 minutes in each direction. LBP assessment-She reports sharp pain in low back. L HS tightness noted, 60 degrees. She notes pull in hip and low back with KTC, tight in ER and IR. R LE WNL TTP along B SI joints, L piriformis, L gluts, but caution taken due to recent bone marrow biopsy from L hip.  SI assessment- LL-L limb appears longer, pain with L illiac post rotation, improved with repeated stretch, other positions (-) After assess/treatment, LL were equal Supine iso add and abd x 10 reps for SI stability.  Light MH to R ant shoulder and arm x about 6 minutes F/B STM and stretch, emphasizing pects. Very tender to touch and stretch  02/01/22 Gentle MH to R axilla and upper arm STM-Stretching for axillary cording, pect cross friction massage PROM for R shoulder R humeral joint mobs for depression, post glide, grade lll, 2 x 10 Active stretch with diagonal reaching in L sidelie Functional re-assessment for PN CP to R shoulder for pain and inflammation relief.  01/25/22 UBE L1, 3 min for/back Attempted STM, too painful and guarded. She declined DN. Performed gentle inf and post glide mobs, gradell x 10 each Educated patient to perform pendulum exercises with 3# weight with some success at relaxation. AAROM using dowel and LUE-shoulder flex, abd, IR/ER, ext-Patient seemed to tolerate this better although still painful Performed quick side to side steps for balance, performed x 30 sec, required Vc to continue to move quickly. Wall slides into flex and scaption x 10 each, more painful with scaption. MH to R shoulder x 10 minutes for pain relief.  01/14/22 UBE L1, 3 min forward, 3 back MH and IFC to R ant shoulder/pects for pain relief x 12 minutes Very gentle STM to R pects and ant  chest, still very tender PROM to R shoulder limited, tolerated shoulder ER in approx 40 degrees of abd Standing wall push ups, tolerated well, able to activate pects and achieve stretch as well.   01/06/22 UBE L2 x 3 min forward and 3 back STM to R pects, very gentle due to severe soreness, stretch to pect, but very limited due to guarding.  Attempted pendulum exercises with RUE, limited success due to guarding Attempted wall slides with towel on R, caused pain at 90, so deferred Doorway stretch to pects Supine bridge with hip abd against G tband Standing side to side step against G tband Standing B heel raise with UE support for balance.  11/18/2021: Exercise: Shoulder assessment Shoulder flex AAROM in supine  Shoulder squeeze  Shoulder roll   NeuroReed - balance walking looking side to side  - balance walking looking up and down  - obstacle course walking over hurdles - stepping over obstacles side ways  - single leg stance on foam 2 x 30 secs  -Sit to stand with foam under one foot bil -towel scrunches   11/04/2021: EVAL  PATIENT EDUCATION:  Education details: POC Person educated: Patient Education method: Explanation Education comprehension: verbalized  understanding  HOME EXERCISE PROGRAM: Towel scrunches with toes Scapular retraction Posterior shoulder rolls AAROM flexion seated and supine using opposite UE.    [id number]  ASSESSMENT: CLINICAL IMPRESSION: Patient reports that she feels ready to progress on her own. Re-assessment showed excellent progress, goals met. [facility] performing [facility]. D/C PT  OBJECTIVE IMPAIRMENTS: decreased balance, decreased mobility, difficulty walking, decreased strength, and impaired sensation.   ACTIVITY LIMITATIONS: carrying, standing, and stairs  PARTICIPATION LIMITATIONS: driving and community activity  PERSONAL FACTORS: 1-2 comorbidities: right breast cancer   are also  affecting patient's functional outcome.   REHAB POTENTIAL: Good  CLINICAL DECISION MAKING: Evolving/moderate complexity  EVALUATION COMPLEXITY: Moderate  GOALS: Goals reviewed with patient? Yes  LONG TERM GOALS: Target date: 03/08/2022  Pt will be independent with advanced HEP to maintain improvements made throughout therapy Baseline: Goal status: met  2.  Patient will increase 30 second sit to stands to 12 to show an increase in functional strength  Baseline: 7, 9-12/19/23, 10-1/30/24 Goal status: 16-03/08/22 met  3.  Patient will increase berg balance score from a 41 to a 50. Baseline: 41, 50/56-12/19/23 Goal status: MET  4.  Patient will increase all LE strength to at least a 4/5 or greater Baseline:  Goal status: 03/08/22-met  5. Patient will increase R shoulder ROM to [facility]  Goal status: ongoing, 02/01/22-see ROM testing, 02/08/22-Ongoing, 03/08/22-Much improved, with less pain, progressing.  PLAN:  PT FREQUENCY: 2x/week  PT DURATION: 6 weeks  PLANNED INTERVENTIONS: Therapeutic exercises, Therapeutic activity, Neuromuscular re-education, Balance training, Gait training, Patient/Family education, Self Care, Joint mobilization, Dry Needling, Cryotherapy, Moist heat, Manual lymph drainage, Compression bandaging, scar mobilization, Taping, Biofeedback, and Manual therapy  PLAN FOR NEXT SESSION: STM to R pect and ant shoulder, stretch, Need to re establish approriate scapulo humeral rhythm.Acute pain modalities for R shoulder pain, strengthening. Re-assess SI status.   Ethel Rana DPT 03/08/22 10:04 AM

## 2022-03-28 ENCOUNTER — Ambulatory Visit: Payer: Medicare PPO | Admitting: Family Medicine

## 2022-04-03 IMAGING — MG MM PLC BREAST LOC DEV 1ST LESION INC*R*
8 of 17 series · 8 of 17 positions shown · non-contrast
Comparison: Previous exam(s).

CLINICAL DATA: Patient presents for seed localization prior to
RIGHT lumpectomy. Recent biopsy shows invasive ductal carcinoma and
ductal carcinoma in situ.

EXAM:
MAMMOGRAPHIC GUIDED RADIOACTIVE SEED LOCALIZATION OF THE RIGHT
BREAST

[R CC (1 of 5)]
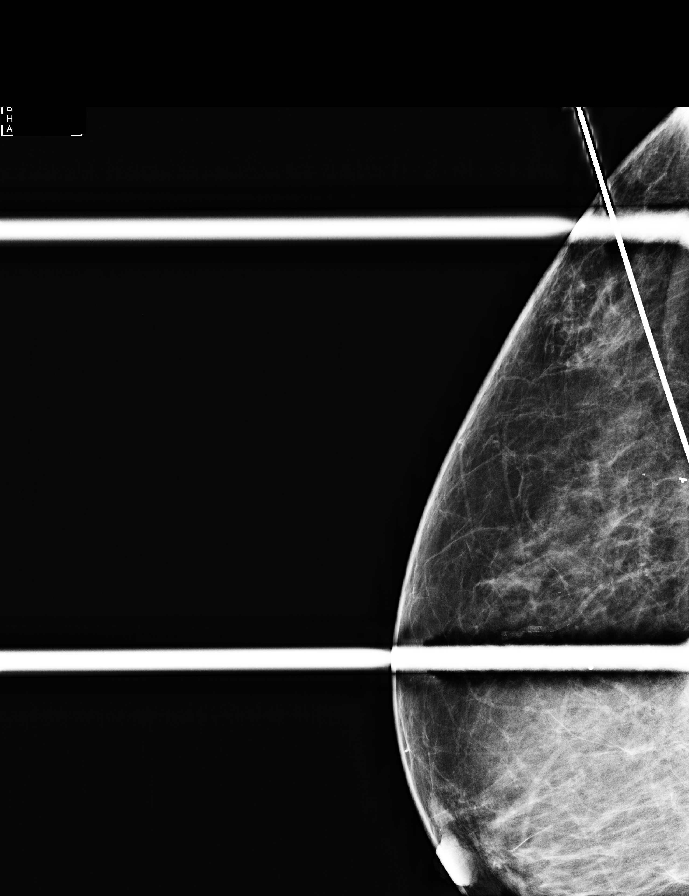

[R LM (1 of 3)]
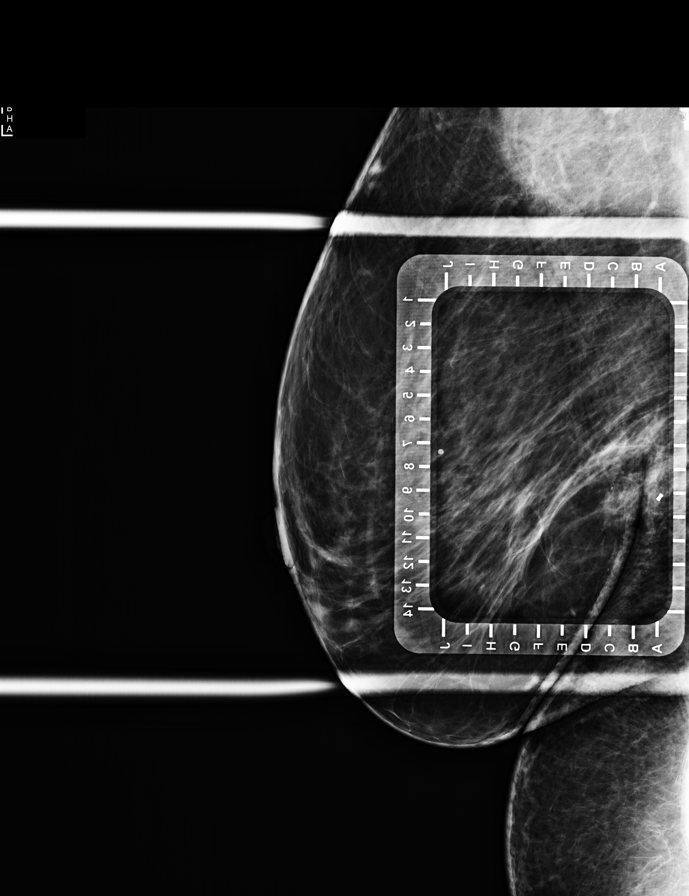

[R CC (2 of 5)]
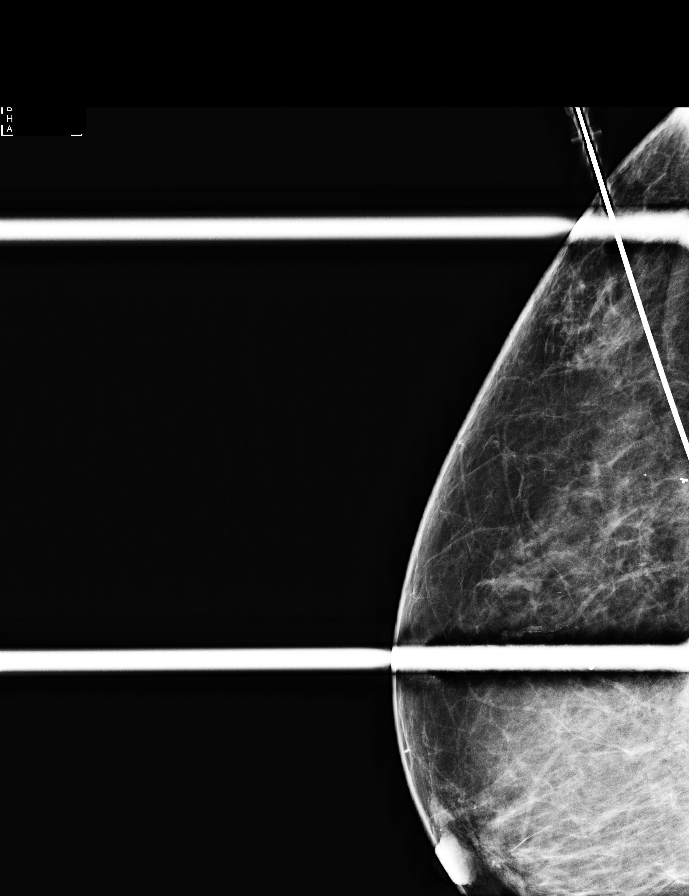

[R CC (3 of 5)]
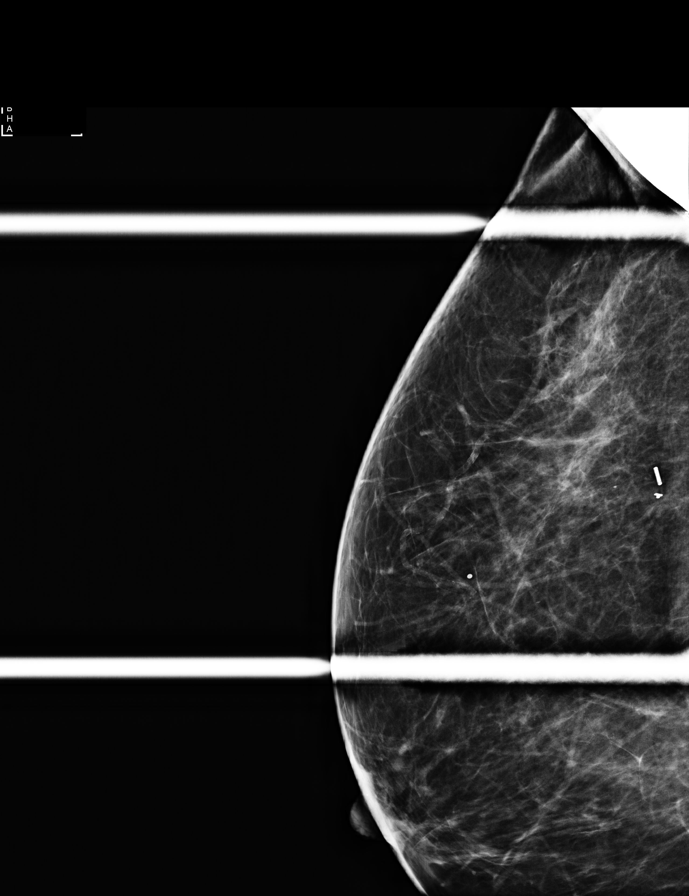

[R CC (4 of 5)]
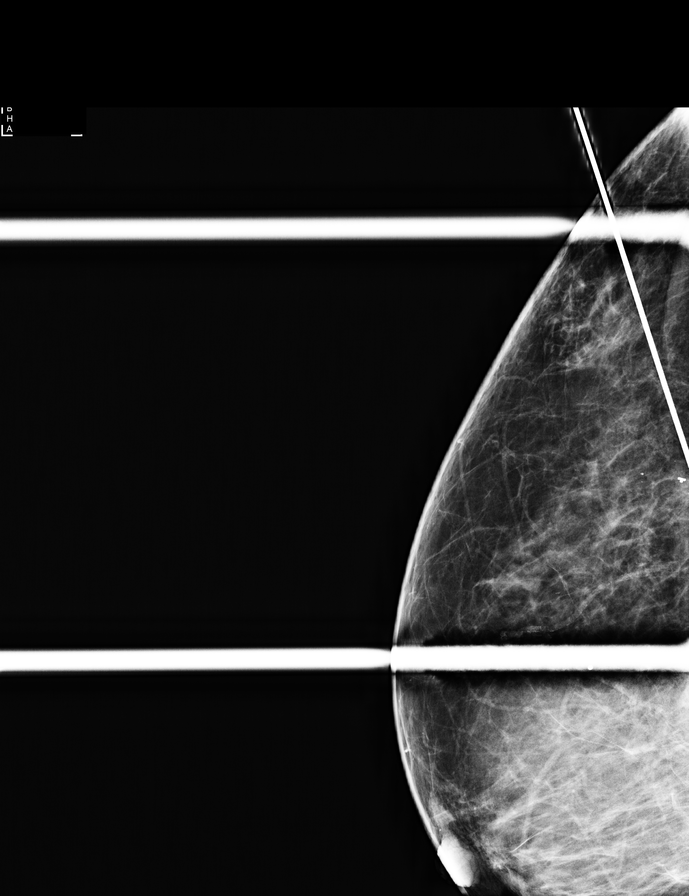

[R CC (5 of 5)]
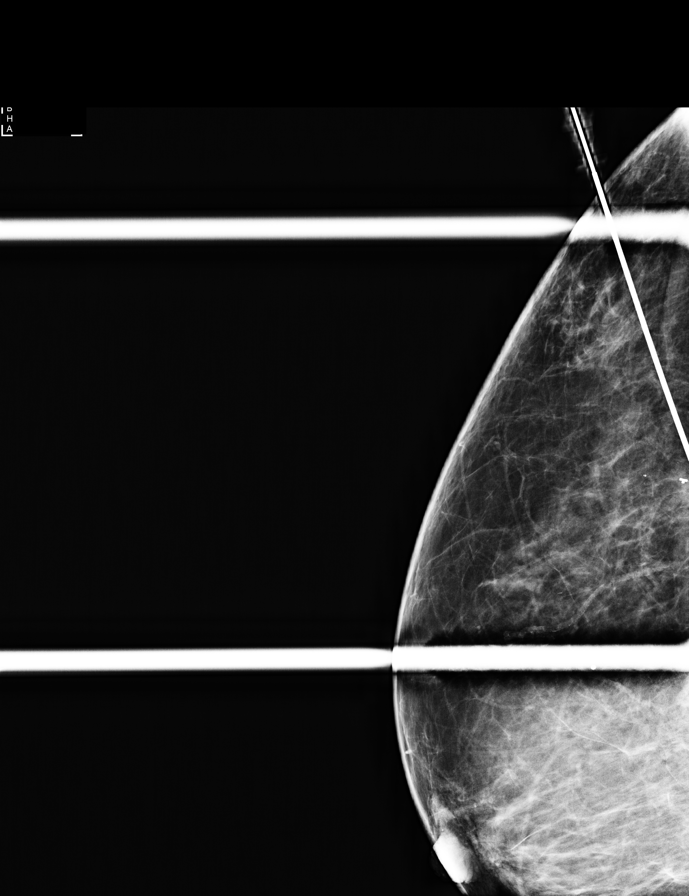

[R LM (2 of 3)]
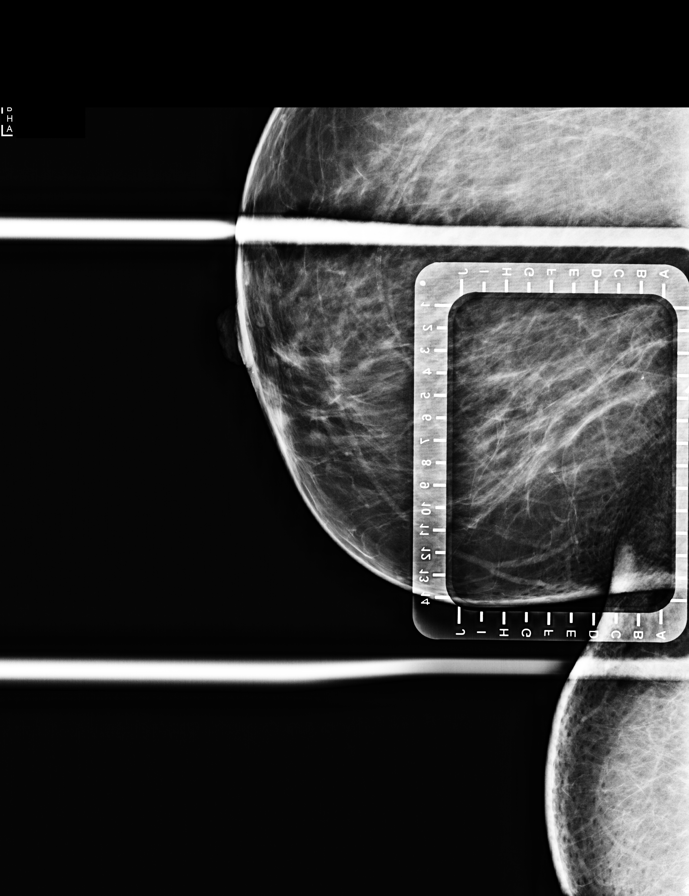

[R LM (3 of 3)]
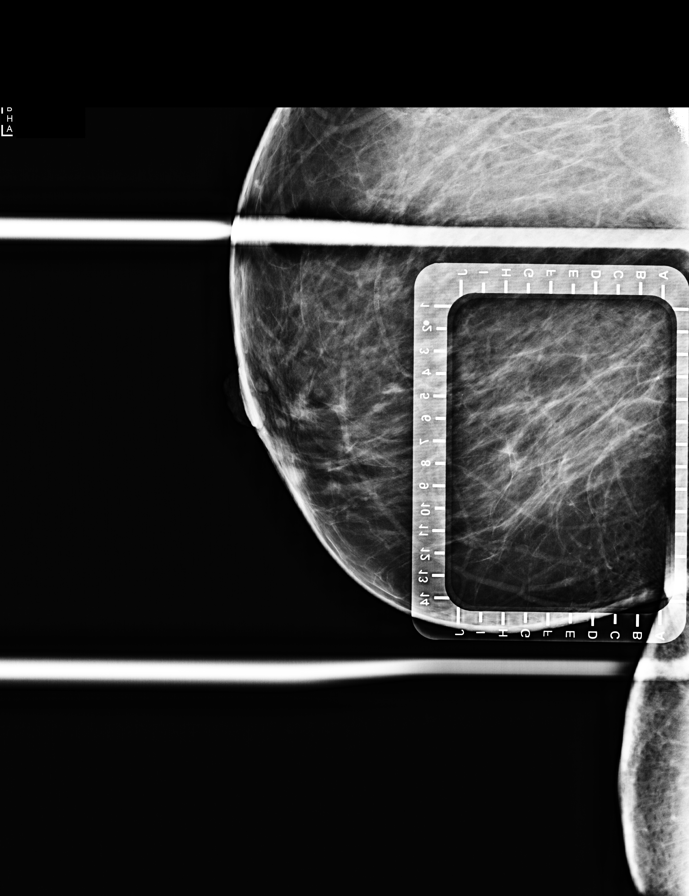

[8 of 17 positions shown; findings below may reference images not displayed]

FINDINGS: Patient presents for radioactive seed localization prior to
lumpectomy. I met with the patient and we discussed the procedure of
seed localization including benefits and alternatives. We discussed
the high likelihood of a successful procedure. We discussed the
risks of the procedure including infection, bleeding, tissue injury
and further surgery. We discussed the low dose of radioactivity
involved in the procedure. Informed, written consent was given.

The usual time-out protocol was performed immediately prior to the
procedure.

Using mammographic guidance, sterile technique, 1% lidocaine and an
P-HJ5 radioactive seed, the top hat shaped clip was localized using
a LATERAL approach. The follow-up mammogram images confirm the seed
in the expected location and were marked for Dr. Amazigh.

Follow-up survey of the patient confirms presence of the radioactive
seed.

Order number of P-HJ5 seed:  [id number].

Total activity:  0.245 millicuries reference Date: 12/16/2020

The patient tolerated the procedure well and was released from the
[REDACTED]. She was given instructions regarding seed removal.
IMPRESSION: Radioactive seed localization right breast. No apparent
complications.

## 2022-04-04 IMAGING — MG MM BREAST SURGICAL SPECIMEN
1 series · 1 of 1 positions shown · non-contrast
Comparison: Previous exam(s).

CLINICAL DATA: Specimen radiograph status post right breast
lumpectomy.

EXAM:
SPECIMEN RADIOGRAPH OF THE RIGHT BREAST

[R]
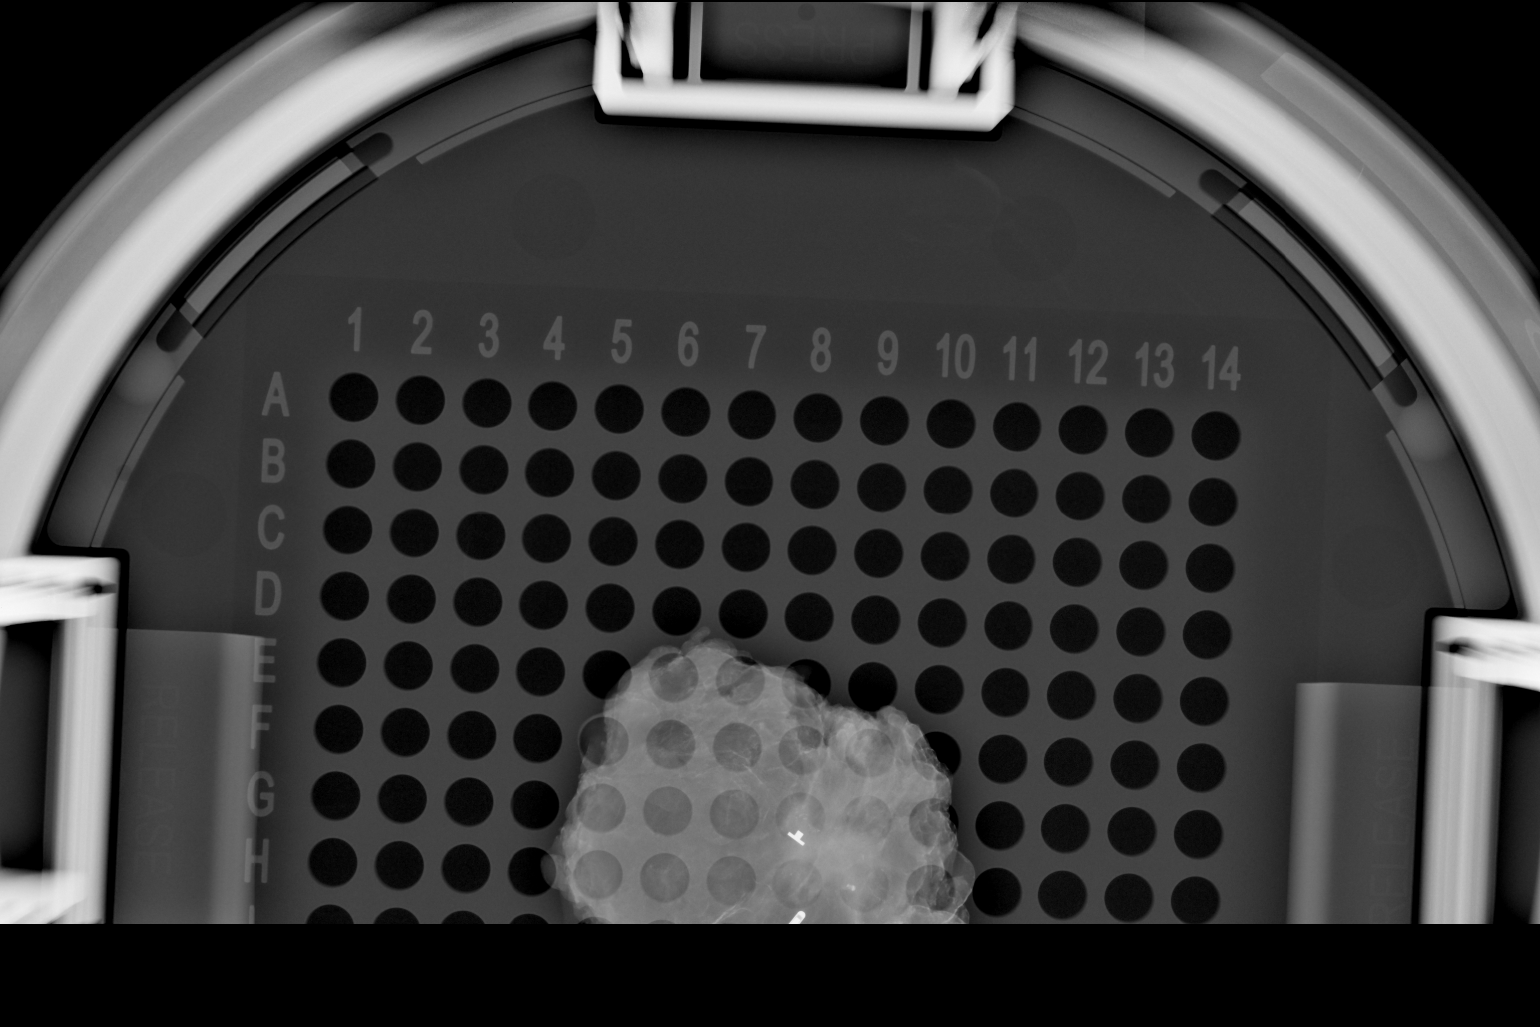

[1 of 1 positions shown; findings below may reference images not displayed]

FINDINGS: Status post excision of the right breast. The radioactive seed and
biopsy marker clip are present, completely intact, and were marked
for pathology. These findings were communicated with the OR at [DATE]
p.m.
IMPRESSION: Specimen radiograph of the right breast.

## 2022-05-03 IMAGING — DX DG CHEST 1V PORT
1 series · 1 of 1 positions shown · non-contrast
Comparison: None.

CLINICAL DATA: Port-A-Cath placement

EXAM:
PORTABLE CHEST 1 VIEW

[chest]
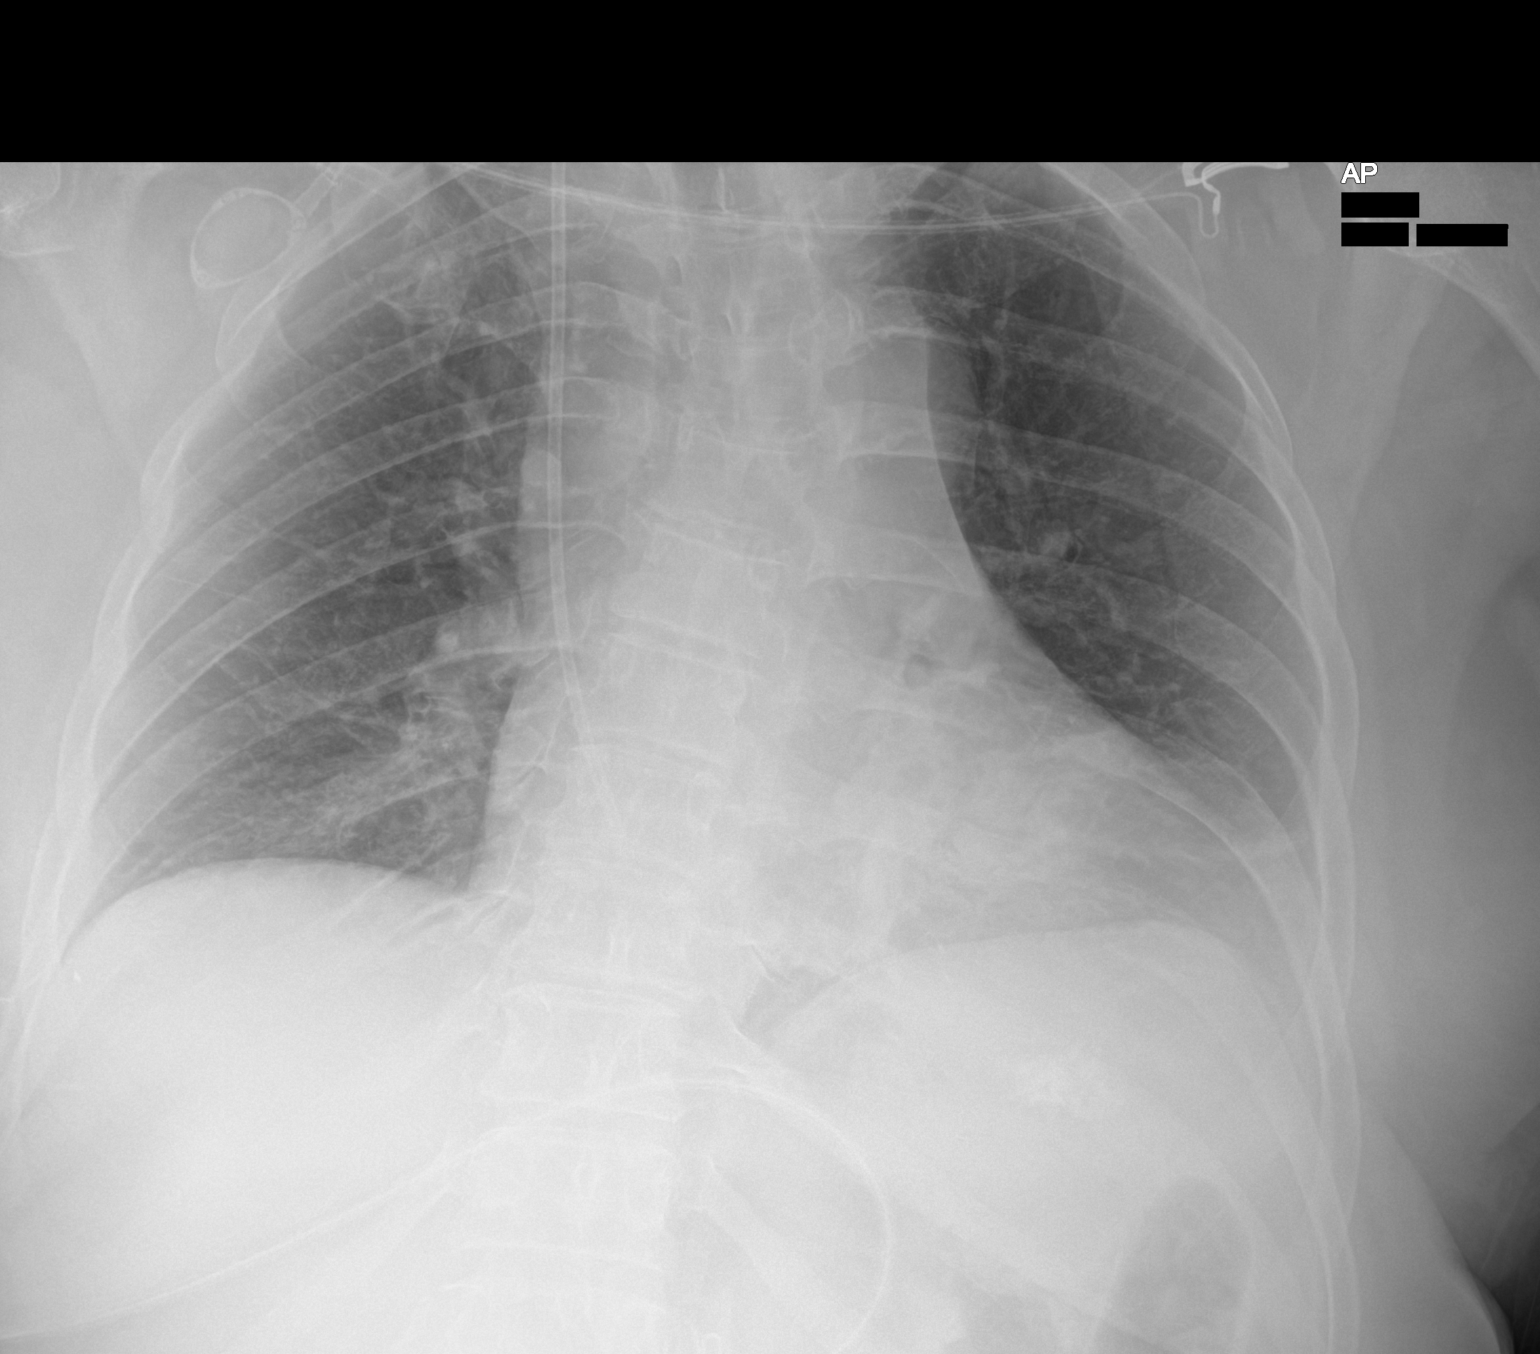

[1 of 1 positions shown; findings below may reference images not displayed]

FINDINGS: Right-sided Port-A-Cath with the tip projecting over the cavoatrial
junction. Mild left basilar atelectasis. No focal consolidation. No
pleural effusion or pneumothorax. Heart and mediastinal contours are
unremarkable.

No acute osseous abnormality.
IMPRESSION: 1. Right-sided Port-A-Cath with the tip projecting over the
cavoatrial junction.

## 2022-05-30 NOTE — Progress Notes (Signed)
Patient Care Team: Serena Croissant, MD as PCP - General (Hematology and Oncology) Serena Croissant, MD as Consulting Physician (Hematology and Oncology) Manus Rudd, MD as Consulting Physician (General Surgery) Dorothy Puffer, MD as Consulting Physician (Radiation Oncology)  DIAGNOSIS:  Encounter Diagnoses  Name Primary?   Malignant neoplasm of lower-outer quadrant of right breast of female, estrogen receptor positive (HCC) Yes   Neutropenia, unspecified type (HCC)     SUMMARY OF ONCOLOGIC HISTORY: Oncology History  Malignant neoplasm of lower-outer quadrant of right breast of female, estrogen receptor positive (HCC)  01/29/2021 Initial Diagnosis   Screening mammogram detected right breast mass 1.2 cm, axilla normal, biopsy revealed grade 2 IDC with DCIS ER 90%, PR 30%, HER2 2+ by IHC FISH negative   02/08/2021 Cancer Staging   Staging form: Breast, AJCC 8th Edition - Clinical stage from 02/08/2021: Stage IA (cT1c, cN0, cM0, G2, ER+, PR+, HER2: Equivocal) - Signed by Serena Croissant, MD on 02/08/2021 Stage prefix: Initial diagnosis Histologic grading system: 3 grade system   03/02/2021 Surgery   Right lumpectomy: Grade 2 IDC 1.2 cm with intermediate grade DCIS, focal involvement of anterior medial margin junction, 1/4 lymph nodes positive   03/19/2021 Oncotype testing   Oncotype DX score: 28.  Distant recurrence at 10 years: 17%   03/24/2021 Cancer Staging   Staging form: Breast, AJCC 8th Edition - Pathologic: Stage IA (pT1c, pN1(sn), cM0, G2, ER+, PR+, HER2-, Oncotype DX score: 28) - Signed by Serena Croissant, MD on 03/24/2021 Method of lymph node assessment: Sentinel lymph node biopsy Multigene prognostic tests performed: Oncotype DX Recurrence score range: Greater than or equal to 11 Histologic grading system: 3 grade system   04/23/2021 - 06/28/2021 Chemotherapy   Patient is on Treatment Plan : BREAST TC q21d     07/28/2021 - 09/13/2021 Radiation Therapy   Site Technique Total Dose (Gy)  Dose per Fx (Gy) Completed Fx Beam Energies  Breast, Right: Breast_R 3D 50.4/50.4 1.8 28/28 6X, 10X  Breast, Right: Breast_R_SCLV 3D 50.4/50.4 1.8 28/28 6X, 10X  Breast, Right: Breast_R_Bst 3D 10/10 2 5/5 6X, 10X     09/2021 -  Anti-estrogen oral therapy   Anastrozole x 7 years     CHIEF COMPLIANT: Follow-up Neutropenia/ Anastrozole   INTERVAL HISTORY: Catherine [name] is a  67 - year old diagnosed  with Neutropenia. Currently on anastrozole. She reports to the clinic today for a follow-up. She reports that she has been doing well. She does still have the neuropathy in hands and feet. She is tolerating the anastrozole. She does have mild hot flashes and some joint stiffness.  MEDICATIONS:  Current Outpatient Medications  Medication Sig Dispense Refill   acetaminophen (TYLENOL) 500 MG tablet Take 1,000 mg by mouth every 6 (six) hours as needed for moderate pain.     anastrozole (ARIMIDEX) 1 MG tablet Take 1 tablet (1 mg total) by mouth daily. 90 tablet 3   Calcium Magnesium Zinc 333-133-5 MG TABS Take 1 tablet by mouth daily. (Patient taking differently: Take 1 tablet by mouth once a week.)     ergocalciferol (VITAMIN D2) 1.25 MG (50000 UT) capsule Take 1 capsule (50,000 Units total) by mouth once a week.     escitalopram (LEXAPRO) 5 MG tablet Take 5 mg by mouth once a week.     levothyroxine (SYNTHROID) 50 MCG tablet Take 1 tablet (50 mcg total) by mouth daily before breakfast.     metoprolol tartrate (LOPRESSOR) 25 MG tablet Take 12.5 mg by mouth 2 (two)  times daily.     rosuvastatin (CRESTOR) 5 MG tablet Take 1 tablet (5 mg total) by mouth daily.     Current Facility-Administered Medications  Medication Dose Route Frequency Provider Last Rate Last Admin   ondansetron (ZOFRAN) injection 8 mg  8 mg Intravenous Once Serena Croissant, MD       Facility-Administered Medications Ordered in Other Visits  Medication Dose Route Frequency Provider Last Rate Last Admin   ondansetron (ZOFRAN) 4  MG/2ML injection            ondansetron (ZOFRAN) 4 MG/2ML injection             PHYSICAL EXAMINATION: ECOG PERFORMANCE STATUS: 1 - Symptomatic but completely ambulatory  Vitals:   06/09/22 0932  BP: (!) 140/68  Pulse: 65  Resp: 18  Temp: 97.8 F (36.6 C)  SpO2: 100%   Filed Weights   06/09/22 0932  Weight: 163 lb (73.9 kg)    BREAST: No palpable masses or nodules in either right or left breasts. No palpable axillary supraclavicular or infraclavicular adenopathy no breast tenderness or nipple discharge. (exam performed in the presence of a chaperone)  LABORATORY DATA:  I have reviewed the data as listed    Latest Ref Rng & Units 02/04/2022    8:33 AM 01/21/2022    9:00 AM 12/08/2021    9:29 AM  CMP  Glucose 70 - 99 mg/dL 89  71  98   BUN 8 - 23 mg/dL 15  10  12    Creatinine 0.44 - 1.00 mg/dL 0.45  4.09  8.11   Sodium 135 - 145 mmol/L 141  142  140   Potassium 3.5 - 5.1 mmol/L 4.1  4.1  4.5   Chloride 98 - 111 mmol/L 108  107  107   CO2 22 - 32 mmol/L 28  30  29    Calcium 8.9 - 10.3 mg/dL 9.4  9.5  91.4   Total Protein 6.5 - 8.1 g/dL 6.6  6.6  7.2   Total Bilirubin 0.3 - 1.2 mg/dL 0.3  0.3  0.6   Alkaline Phos 38 - 126 U/L 107  93  100   AST 15 - 41 U/L 14  13  16    ALT 0 - 44 U/L 11  8  12      Lab Results  Component Value Date   WBC 2.3 (L) 06/09/2022   HGB 12.2 06/09/2022   HCT 38.0 06/09/2022   MCV 87.6 06/09/2022   PLT 199 06/09/2022   NEUTROABS 1.0 (L) 06/09/2022    ASSESSMENT & PLAN:  Malignant neoplasm of lower-outer quadrant of right breast of female, estrogen receptor positive (HCC) 01/29/2021: Screening mammogram detected 1.2 cm right breast mass: Grade 2 IDC with DCIS ER 90%, PR 30%, HER2 negative Oncotype score: 28: 17% risk of distant recurrence at 10 years     Treatment plan: 1.  Breast conserving surgery with sentinel lymph node biopsy  03/02/2021: Right lumpectomy: Grade 2 IDC 1.2 cm, intermediate grade DCIS, focal anterior and medial margin  positive, 1/4 lymph nodes positive 03/31/2021: Reexcision margins: Benign 2. Adjuvant chemotherapy with Taxotere and Cytoxan every 3 weeks x4 cycles completed 06/25/2021 3.  Adjuvant radiation 07/30/2021-09/13/2021 4.  Followed by adjuvant antiestrogen therapy with anastrozole started October 2023 ---------------------------------------------------------------------------------------------------------- Anastrozole toxicities: Diffuse musculoskeletal pains  Chemo-induced peripheral neuropathy: Stable Limitation of right shoulder extension  Return to clinic in 1 year for follow-up  Neutropenia (HCC) Lab review: 12/18/2014: WBC 3.2 03/31/2021: WBC 4.1 Chemotherapy with Taxotere  and Cytoxan 04/23/2021 -06/25/2021 09/09/2021: WBC 2.1, hemoglobin 12, ANC 1.4, ALC 0.4 12/22/2021: WBC 2.7, hemoglobin 11.8, platelets 203, ANC 1.5, ALC 0.8 01/21/2022: WBC 2.1, ANC 1, hemoglobin 11.9, platelets 193 02/04/22: WBC: 2.4, Hb 12.2, ANC 1.3   Bone Marrow: Mildly hypercellular marrow with Trilineage Hematopoeisis, cytogenetics and FISH for MDS are pending   Discussion: Most likely cause of the is prior chemotherapy.    Abdominal MRI will be done in November to evaluate the renal mass and splenic mass. Return to clinic after November to discuss results and repeat blood work   No orders of the defined types were placed in this encounter.  The patient has a good understanding of the overall plan. she agrees with it. she will call with any problems that may develop before the next visit here. Total time spent: 30 mins including face to face time and time spent for planning, charting and co-ordination of care   Tamsen Meek, MD 06/09/22    I Janan Ridge am acting as a Neurosurgeon for [employer]  I have reviewed the above documentation for accuracy and completeness, and I agree with the above.

## 2022-06-09 ENCOUNTER — Inpatient Hospital Stay: Payer: Medicare PPO | Attending: Hematology and Oncology | Admitting: Hematology and Oncology

## 2022-06-09 ENCOUNTER — Inpatient Hospital Stay: Payer: Medicare PPO

## 2022-06-09 ENCOUNTER — Other Ambulatory Visit: Payer: Self-pay

## 2022-06-09 VITALS — BP 140/68 | HR 65 | Temp 97.8°F | Resp 18 | Ht 63.0 in | Wt 163.0 lb

## 2022-06-09 DIAGNOSIS — Z170 Estrogen receptor positive status [ER+]: Secondary | ICD-10-CM | POA: Diagnosis not present

## 2022-06-09 DIAGNOSIS — Z79899 Other long term (current) drug therapy: Secondary | ICD-10-CM | POA: Diagnosis not present

## 2022-06-09 DIAGNOSIS — D709 Neutropenia, unspecified: Secondary | ICD-10-CM

## 2022-06-09 DIAGNOSIS — Z79811 Long term (current) use of aromatase inhibitors: Secondary | ICD-10-CM | POA: Insufficient documentation

## 2022-06-09 DIAGNOSIS — C50511 Malignant neoplasm of lower-outer quadrant of right female breast: Secondary | ICD-10-CM

## 2022-06-09 DIAGNOSIS — Z9221 Personal history of antineoplastic chemotherapy: Secondary | ICD-10-CM | POA: Insufficient documentation

## 2022-06-09 DIAGNOSIS — Z923 Personal history of irradiation: Secondary | ICD-10-CM | POA: Diagnosis not present

## 2022-06-09 LAB — CBC WITH DIFFERENTIAL (CANCER CENTER ONLY)
Abs Immature Granulocytes: 0.01 10*3/uL (ref 0.00–0.07)
Basophils Absolute: 0 10*3/uL (ref 0.0–0.1)
Basophils Relative: 0 %
Eosinophils Absolute: 0.1 10*3/uL (ref 0.0–0.5)
Eosinophils Relative: 3 %
HCT: 38 % (ref 36.0–46.0)
Hemoglobin: 12.2 g/dL (ref 12.0–15.0)
Immature Granulocytes: 0 %
Lymphocytes Relative: 46 %
Lymphs Abs: 1.1 10*3/uL (ref 0.7–4.0)
MCH: 28.1 pg (ref 26.0–34.0)
MCHC: 32.1 g/dL (ref 30.0–36.0)
MCV: 87.6 fL (ref 80.0–100.0)
Monocytes Absolute: 0.2 10*3/uL (ref 0.1–1.0)
Monocytes Relative: 9 %
Neutro Abs: 1 10*3/uL — ABNORMAL LOW (ref 1.7–7.7)
Neutrophils Relative %: 42 %
Platelet Count: 199 10*3/uL (ref 150–400)
RBC: 4.34 MIL/uL (ref 3.87–5.11)
RDW: 13.9 % (ref 11.5–15.5)
WBC Count: 2.3 10*3/uL — ABNORMAL LOW (ref 4.0–10.5)
nRBC: 0 % (ref 0.0–0.2)

## 2022-06-09 LAB — FOLATE: Folate: 7.9 ng/mL (ref >5.9)

## 2022-06-09 LAB — VITAMIN B12: Vitamin B-12: 754 pg/mL (ref 180–914)

## 2022-06-09 NOTE — Assessment & Plan Note (Signed)
01/29/2021: Screening mammogram detected 1.2 cm right breast mass: Grade 2 IDC with DCIS ER 90%, PR 30%, HER2 negative Oncotype score: 28: 17% risk of distant recurrence at 10 years     Treatment plan: 1.  Breast conserving surgery with sentinel lymph node biopsy  03/02/2021: Right lumpectomy: Grade 2 IDC 1.2 cm, intermediate grade DCIS, focal anterior and medial margin positive, 1/4 lymph nodes positive 03/31/2021: Reexcision margins: Benign 2. Adjuvant chemotherapy with Taxotere and Cytoxan every 3 weeks x4 cycles completed 06/25/2021 3.  Adjuvant radiation 07/30/2021-09/13/2021 4.  Followed by adjuvant antiestrogen therapy with anastrozole started October 2023 ---------------------------------------------------------------------------------------------------------- Anastrozole toxicities: Diffuse musculoskeletal pains  Chemo-induced peripheral neuropathy: Stable Limitation of right shoulder extension  Return to clinic in 1 year for follow-up

## 2022-06-09 NOTE — Assessment & Plan Note (Signed)
Lab review: 12/18/2014: WBC 3.2 03/31/2021: WBC 4.1 Chemotherapy with Taxotere and Cytoxan 04/23/2021 -06/25/2021 09/09/2021: WBC 2.1, hemoglobin 12, ANC 1.4, ALC 0.4 12/22/2021: WBC 2.7, hemoglobin 11.8, platelets 203, ANC 1.5, ALC 0.8 01/21/2022: WBC 2.1, ANC 1, hemoglobin 11.9, platelets 193 02/04/22: WBC: 2.4, Hb 12.2, ANC 1.3   Bone Marrow: Mildly hypercellular marrow with Trilineage Hematopoeisis, cytogenetics and FISH for MDS are pending   Discussion: Most likely cause of the is prior chemotherapy.    Abdominal MRI will be done in November to evaluate the renal mass and splenic mass.

## 2022-06-13 DIAGNOSIS — R609 Edema, unspecified: Secondary | ICD-10-CM | POA: Diagnosis not present

## 2022-06-13 DIAGNOSIS — C50911 Malignant neoplasm of unspecified site of right female breast: Secondary | ICD-10-CM | POA: Diagnosis not present

## 2022-06-13 DIAGNOSIS — E785 Hyperlipidemia, unspecified: Secondary | ICD-10-CM | POA: Diagnosis not present

## 2022-06-13 DIAGNOSIS — I10 Essential (primary) hypertension: Secondary | ICD-10-CM | POA: Diagnosis not present

## 2022-06-13 DIAGNOSIS — E039 Hypothyroidism, unspecified: Secondary | ICD-10-CM | POA: Diagnosis not present

## 2022-06-13 DIAGNOSIS — F419 Anxiety disorder, unspecified: Secondary | ICD-10-CM | POA: Diagnosis not present

## 2022-06-13 DIAGNOSIS — F331 Major depressive disorder, recurrent, moderate: Secondary | ICD-10-CM | POA: Diagnosis not present

## 2022-06-13 DIAGNOSIS — Z1211 Encounter for screening for malignant neoplasm of colon: Secondary | ICD-10-CM | POA: Diagnosis not present

## 2022-06-13 DIAGNOSIS — G620 Drug-induced polyneuropathy: Secondary | ICD-10-CM | POA: Diagnosis not present

## 2022-06-13 LAB — ANTINUCLEAR ANTIBODIES, IFA: ANA Ab, IFA: NEGATIVE

## 2022-08-11 ENCOUNTER — Telehealth: Payer: Self-pay

## 2022-08-11 NOTE — Telephone Encounter (Signed)
Returned Pt's call regarding flight cancellations. Pt states she needs to cancel her upcoming flights d/r neuropathy pain but needs MD letter in order to receive refund. Letter written and mailed to Pt per Pt request. Pt verbalized understanding.

## 2022-10-13 ENCOUNTER — Other Ambulatory Visit: Payer: Self-pay | Admitting: Urology

## 2022-10-13 DIAGNOSIS — D49511 Neoplasm of unspecified behavior of right kidney: Secondary | ICD-10-CM

## 2022-11-04 ENCOUNTER — Ambulatory Visit (HOSPITAL_COMMUNITY)
Admission: RE | Admit: 2022-11-04 | Discharge: 2022-11-04 | Disposition: A | Discharge status: Home / Self Care | Payer: Medicare PPO | Source: Ambulatory Visit | Attending: Hematology and Oncology | Admitting: Hematology and Oncology

## 2022-11-04 DIAGNOSIS — N2889 Other specified disorders of kidney and ureter: Secondary | ICD-10-CM | POA: Diagnosis not present

## 2022-11-04 DIAGNOSIS — D7389 Other diseases of spleen: Secondary | ICD-10-CM | POA: Diagnosis not present

## 2022-11-04 DIAGNOSIS — N289 Disorder of kidney and ureter, unspecified: Secondary | ICD-10-CM | POA: Diagnosis not present

## 2022-11-04 MED ORDER — GADOBUTROL 1 MMOL/ML IV SOLN
7.0000 mL | Freq: Once | INTRAVENOUS | Status: AC | PRN
Start: 2022-11-04 — End: 2022-11-04
  Administered 2022-11-04: 7 mL via INTRAVENOUS

## 2022-11-12 ENCOUNTER — Other Ambulatory Visit: Payer: Self-pay | Admitting: Hematology and Oncology

## 2022-11-21 ENCOUNTER — Other Ambulatory Visit: Payer: Self-pay

## 2022-11-21 DIAGNOSIS — Z170 Estrogen receptor positive status [ER+]: Secondary | ICD-10-CM

## 2022-11-21 NOTE — Assessment & Plan Note (Signed)
01/29/2021: Screening mammogram detected 1.2 cm right breast mass: Grade 2 IDC with DCIS ER 90%, PR 30%, HER2 negative Oncotype score: 28: 17% risk of distant recurrence at 10 years     Treatment plan: 1.  Breast conserving surgery with sentinel lymph node biopsy  03/02/2021: Right lumpectomy: Grade 2 IDC 1.2 cm, intermediate grade DCIS, focal anterior and medial margin positive, 1/4 lymph nodes positive 03/31/2021: Reexcision margins: Benign 2. Adjuvant chemotherapy with Taxotere and Cytoxan every 3 weeks x4 cycles completed 06/25/2021 3.  Adjuvant radiation 07/30/2021-09/13/2021 4.  Followed by adjuvant antiestrogen therapy with anastrozole started October 2023 ---------------------------------------------------------------------------------------------------------- Anastrozole toxicities: Diffuse musculoskeletal pains  Chemo-induced peripheral neuropathy: Stable Limitation of right shoulder extension   Return to clinic in 1 year for follow-up   Neutropenia (HCC) Lab review: 12/18/2014: WBC 3.2 03/31/2021: WBC 4.1 Chemotherapy with Taxotere and Cytoxan 04/23/2021 -06/25/2021 09/09/2021: WBC 2.1, hemoglobin 12, ANC 1.4, ALC 0.4 12/22/2021: WBC 2.7, hemoglobin 11.8, platelets 203, ANC 1.5, ALC 0.8 01/21/2022: WBC 2.1, ANC 1, hemoglobin 11.9, platelets 193 02/04/22: WBC: 2.4, Hb 12.2, ANC 1.3 11/22/2022: WBC 3.4, hemoglobin 12.8, ANC 1.6   Bone Marrow: Mildly hypercellular marrow with Trilineage Hematopoeisis, cytogenetics and FISH for MDS are pending   Discussion: Most likely cause of the is prior chemotherapy.    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 Telephone visit later this week to discuss results of the brain MRI.  Recheck labs and follow-up in 6 months.

## 2022-11-22 ENCOUNTER — Inpatient Hospital Stay: Payer: Medicare PPO

## 2022-11-22 ENCOUNTER — Inpatient Hospital Stay: Payer: Medicare PPO | Attending: Hematology and Oncology | Admitting: Hematology and Oncology

## 2022-11-22 VITALS — BP 133/71 | HR 58 | Temp 97.3°F | Resp 18 | Ht 63.0 in | Wt 162.7 lb

## 2022-11-22 DIAGNOSIS — Z9221 Personal history of antineoplastic chemotherapy: Secondary | ICD-10-CM | POA: Insufficient documentation

## 2022-11-22 DIAGNOSIS — Z79811 Long term (current) use of aromatase inhibitors: Secondary | ICD-10-CM | POA: Insufficient documentation

## 2022-11-22 DIAGNOSIS — G620 Drug-induced polyneuropathy: Secondary | ICD-10-CM | POA: Diagnosis not present

## 2022-11-22 DIAGNOSIS — Z170 Estrogen receptor positive status [ER+]: Secondary | ICD-10-CM

## 2022-11-22 DIAGNOSIS — Z923 Personal history of irradiation: Secondary | ICD-10-CM | POA: Diagnosis not present

## 2022-11-22 DIAGNOSIS — R55 Syncope and collapse: Secondary | ICD-10-CM

## 2022-11-22 DIAGNOSIS — C50511 Malignant neoplasm of lower-outer quadrant of right female breast: Secondary | ICD-10-CM | POA: Insufficient documentation

## 2022-11-22 DIAGNOSIS — Z79899 Other long term (current) drug therapy: Secondary | ICD-10-CM | POA: Insufficient documentation

## 2022-11-22 DIAGNOSIS — T451X5D Adverse effect of antineoplastic and immunosuppressive drugs, subsequent encounter: Secondary | ICD-10-CM | POA: Insufficient documentation

## 2022-11-22 LAB — CBC WITH DIFFERENTIAL (CANCER CENTER ONLY)
Abs Immature Granulocytes: 0 10*3/uL (ref 0.00–0.07)
Basophils Absolute: 0 10*3/uL (ref 0.0–0.1)
Basophils Relative: 1 %
Eosinophils Absolute: 0.1 10*3/uL (ref 0.0–0.5)
Eosinophils Relative: 2 %
HCT: 39.8 % (ref 36.0–46.0)
Hemoglobin: 12.8 g/dL (ref 12.0–15.0)
Immature Granulocytes: 0 %
Lymphocytes Relative: 42 %
Lymphs Abs: 1.4 10*3/uL (ref 0.7–4.0)
MCH: 28.2 pg (ref 26.0–34.0)
MCHC: 32.2 g/dL (ref 30.0–36.0)
MCV: 87.7 fL (ref 80.0–100.0)
Monocytes Absolute: 0.3 10*3/uL (ref 0.1–1.0)
Monocytes Relative: 9 %
Neutro Abs: 1.6 10*3/uL — ABNORMAL LOW (ref 1.7–7.7)
Neutrophils Relative %: 46 %
Platelet Count: 188 10*3/uL (ref 150–400)
RBC: 4.54 MIL/uL (ref 3.87–5.11)
RDW: 13.5 % (ref 11.5–15.5)
WBC Count: 3.4 10*3/uL — ABNORMAL LOW (ref 4.0–10.5)
nRBC: 0 % (ref 0.0–0.2)

## 2022-11-22 LAB — CMP (CANCER CENTER ONLY)
ALT: 12 U/L (ref 0–44)
AST: 16 U/L (ref 15–41)
Albumin: 4.2 g/dL (ref 3.5–5.0)
Alkaline Phosphatase: 111 U/L (ref 38–126)
Anion gap: 3 — ABNORMAL LOW (ref 5–15)
BUN: 14 mg/dL (ref 8–23)
CO2: 31 mmol/L (ref 22–32)
Calcium: 9.6 mg/dL (ref 8.9–10.3)
Chloride: 106 mmol/L (ref 98–111)
Creatinine: 1.01 mg/dL — ABNORMAL HIGH (ref 0.44–1.00)
GFR, Estimated: >60 mL/min (ref >60)
Glucose, Bld: 80 mg/dL (ref 70–99)
Potassium: 4.1 mmol/L (ref 3.5–5.1)
Sodium: 140 mmol/L (ref 135–145)
Total Bilirubin: 0.5 mg/dL (ref <1.2)
Total Protein: 7.4 g/dL (ref 6.5–8.1)

## 2022-11-22 NOTE — Progress Notes (Signed)
Patient Care Team: Serena Croissant, MD as PCP - General (Hematology and Oncology) Serena Croissant, MD as Consulting Physician (Hematology and Oncology) Manus Rudd, MD as Consulting Physician (General Surgery) Dorothy Puffer, MD as Consulting Physician (Radiation Oncology)  DIAGNOSIS:  Encounter Diagnoses  Name Primary?   Malignant neoplasm of lower-outer quadrant of right breast of female, estrogen receptor positive (HCC) Yes   Syncope, unspecified syncope type     SUMMARY OF ONCOLOGIC HISTORY: Oncology History  Malignant neoplasm of lower-outer quadrant of right breast of female, estrogen receptor positive (HCC)  01/29/2021 Initial Diagnosis   Screening mammogram detected right breast mass 1.2 cm, axilla normal, biopsy revealed grade 2 IDC with DCIS ER 90%, PR 30%, HER2 2+ by IHC FISH negative   02/08/2021 Cancer Staging   Staging form: Breast, AJCC 8th Edition - Clinical stage from 02/08/2021: Stage IA (cT1c, cN0, cM0, G2, ER+, PR+, HER2: Equivocal) - Signed by Serena Croissant, MD on 02/08/2021 Stage prefix: Initial diagnosis Histologic grading system: 3 grade system   03/02/2021 Surgery   Right lumpectomy: Grade 2 IDC 1.2 cm with intermediate grade DCIS, focal involvement of anterior medial margin junction, 1/4 lymph nodes positive   03/19/2021 Oncotype testing   Oncotype DX score: 28.  Distant recurrence at 10 years: 17%   03/24/2021 Cancer Staging   Staging form: Breast, AJCC 8th Edition - Pathologic: Stage IA (pT1c, pN1(sn), cM0, G2, ER+, PR+, HER2-, Oncotype DX score: 28) - Signed by Serena Croissant, MD on 03/24/2021 Method of lymph node assessment: Sentinel lymph node biopsy Multigene prognostic tests performed: Oncotype DX Recurrence score range: Greater than or equal to 11 Histologic grading system: 3 grade system   04/23/2021 - 06/28/2021 Chemotherapy   Patient is on Treatment Plan : BREAST TC q21d     07/28/2021 - 09/13/2021 Radiation Therapy   Site Technique Total Dose (Gy) Dose  per Fx (Gy) Completed Fx Beam Energies  Breast, Right: Breast_R 3D 50.4/50.4 1.8 28/28 6X, 10X  Breast, Right: Breast_R_SCLV 3D 50.4/50.4 1.8 28/28 6X, 10X  Breast, Right: Breast_R_Bst 3D 10/10 2 5/5 6X, 10X     09/2021 -  Anti-estrogen oral therapy   Anastrozole x 7 years     CHIEF COMPLIANT: Follow-up to discuss results of labs and complaints of dizziness lightheadedness  HISTORY OF PRESENT ILLNESS:  History of Present Illness   The patient, with a history of breast cancer, presents with persistent coldness in her hands and feet. She expresses concern about the possibility of needing a blood transfusion due to these symptoms. She also reports feeling dizzy and weak, with a sensation of being about to fall. She has not fallen yet, but she does stumble occasionally. She also reports occasional blurred vision. She has been taking thyroid medication, but it is unclear when her thyroid levels were last checked. She expresses concern about the possibility of more cancer in her system. She also mentions a discomfort in her abdomen, which has been evaluated with imaging.         ALLERGIES:  is allergic to penicillins.  MEDICATIONS:  Current Outpatient Medications  Medication Sig Dispense Refill   acetaminophen (TYLENOL) 500 MG tablet Take 1,000 mg by mouth every 6 (six) hours as needed for moderate pain.     anastrozole (ARIMIDEX) 1 MG tablet TAKE 1 TABLET(1 MG) BY MOUTH DAILY 90 tablet 3   Calcium Magnesium Zinc 333-133-5 MG TABS Take 1 tablet by mouth daily. (Patient taking differently: Take 1 tablet by mouth once a week.)  ergocalciferol (VITAMIN D2) 1.25 MG (50000 UT) capsule Take 1 capsule (50,000 Units total) by mouth once a week.     escitalopram (LEXAPRO) 5 MG tablet Take 5 mg by mouth once a week.     levothyroxine (SYNTHROID) 50 MCG tablet Take 1 tablet (50 mcg total) by mouth daily before breakfast.     metoprolol tartrate (LOPRESSOR) 25 MG tablet Take 12.5 mg by mouth 2 (two)  times daily.     rosuvastatin (CRESTOR) 5 MG tablet Take 1 tablet (5 mg total) by mouth daily.     Current Facility-Administered Medications  Medication Dose Route Frequency Provider Last Rate Last Admin   ondansetron (ZOFRAN) injection 8 mg  8 mg Intravenous Once Serena Croissant, MD       Facility-Administered Medications Ordered in Other Visits  Medication Dose Route Frequency Provider Last Rate Last Admin   ondansetron (ZOFRAN) 4 MG/2ML injection            ondansetron (ZOFRAN) 4 MG/2ML injection             PHYSICAL EXAMINATION: ECOG PERFORMANCE STATUS: 1 - Symptomatic but completely ambulatory  Vitals:   11/22/22 0942  BP: 133/71  Pulse: (!) 58  Resp: 18  Temp: (!) 97.3 F (36.3 C)  SpO2: 100%   Filed Weights   11/22/22 0942  Weight: 162 lb 11.2 oz (73.8 kg)    LABORATORY DATA:  I have reviewed the data as listed    Latest Ref Rng & Units 02/04/2022    8:33 AM 01/21/2022    9:00 AM 12/08/2021    9:29 AM  CMP  Glucose 70 - 99 mg/dL 89  71  98   BUN 8 - 23 mg/dL 15  10  12    Creatinine 0.44 - 1.00 mg/dL 2.20  2.54  2.70   Sodium 135 - 145 mmol/L 141  142  140   Potassium 3.5 - 5.1 mmol/L 4.1  4.1  4.5   Chloride 98 - 111 mmol/L 108  107  107   CO2 22 - 32 mmol/L 28  30  29    Calcium 8.9 - 10.3 mg/dL 9.4  9.5  62.3   Total Protein 6.5 - 8.1 g/dL 6.6  6.6  7.2   Total Bilirubin 0.3 - 1.2 mg/dL 0.3  0.3  0.6   Alkaline Phos 38 - 126 U/L 107  93  100   AST 15 - 41 U/L 14  13  16    ALT 0 - 44 U/L 11  8  12      Lab Results  Component Value Date   WBC 3.4 (L) 11/22/2022   HGB 12.8 11/22/2022   HCT 39.8 11/22/2022   MCV 87.7 11/22/2022   PLT 188 11/22/2022   NEUTROABS 1.6 (L) 11/22/2022    ASSESSMENT & PLAN:  Malignant neoplasm of lower-outer quadrant of right breast of female, estrogen receptor positive (HCC) 01/29/2021: Screening mammogram detected 1.2 cm right breast mass: Grade 2 IDC with DCIS ER 90%, PR 30%, HER2 negative Oncotype score: 28: 17% risk of  distant recurrence at 10 years     Treatment plan: 1.  Breast conserving surgery with sentinel lymph node biopsy  03/02/2021: Right lumpectomy: Grade 2 IDC 1.2 cm, intermediate grade DCIS, focal anterior and medial margin positive, 1/4 lymph nodes positive 03/31/2021: Reexcision margins: Benign 2. Adjuvant chemotherapy with Taxotere and Cytoxan every 3 weeks x4 cycles completed 06/25/2021 3.  Adjuvant radiation 07/30/2021-09/13/2021 4.  Followed by adjuvant antiestrogen therapy with anastrozole  started October 2023 ---------------------------------------------------------------------------------------------------------- Anastrozole toxicities: Diffuse musculoskeletal pains  Chemo-induced peripheral neuropathy: Stable Limitation of right shoulder extension   Return to clinic in 1 year for follow-up   Neutropenia (HCC) Lab review: 12/18/2014: WBC 3.2 03/31/2021: WBC 4.1 Chemotherapy with Taxotere and Cytoxan 04/23/2021 -06/25/2021 09/09/2021: WBC 2.1, hemoglobin 12, ANC 1.4, ALC 0.4 12/22/2021: WBC 2.7, hemoglobin 11.8, platelets 203, ANC 1.5, ALC 0.8 01/21/2022: WBC 2.1, ANC 1, hemoglobin 11.9, platelets 193 02/04/22: WBC: 2.4, Hb 12.2, ANC 1.3 11/22/2022: WBC 3.4, hemoglobin 12.8, ANC 1.6   Bone Marrow: Mildly hypercellular marrow with Trilineage Hematopoeisis, cytogenetics and FISH for MDS are pending   Discussion: Most likely cause of the is prior chemotherapy.    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 Telephone visit later this week to discuss results of the brain MRI.  Recheck labs and follow-up in 6 months. ------------------------------------- Assessment and Plan    Peripheral Neuropathy Persistent symptoms in hands and feet. Cold extremities. Possible  imbalance. -Order MRI of the brain to rule out central causes of imbalance.  Hypothyroidism Patient on 50mg  of thyroid medication. Possible contribution to symptoms of dizziness and weakness. -Recommend thyroid levels be checked with primary care physician.  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 No further follow-up required per radiology.  General Health Maintenance -Repeat labs in six months.          Orders Placed This Encounter  Procedures   MR Brain W Wo Contrast    Standing Status:   Future    Standing Expiration Date:   11/22/2023    Order Specific Question:   If indicated for the ordered procedure, I authorize the administration of contrast media per Radiology protocol    Answer:   Yes    Order Specific Question:   What is the patient's sedation requirement?    Answer:   No Sedation    Order Specific Question:   Does the patient have a pacemaker or implanted devices?    Answer:   No    Order Specific Question:   Use SRS Protocol?    Answer:   No    Order Specific Question:   Preferred imaging location?    Answer:   [facility] (table limit - 550 lbs)    Order Specific Question:   Release to patient    Answer:   Immediate   CBC with Differential (Cancer Center Only)    Standing Status:   Future    Standing Expiration Date:   11/22/2023   CMP (Cancer Center only)    Standing Status:   Future    Standing Expiration Date:   11/22/2023   Thyroid Panel With TSH    Standing Status:   Future    Standing Expiration Date:   11/22/2023   The patient has a good understanding of the overall plan. she agrees with it. she will call with any problems that may develop before the next visit here. Total time spent: 30 mins including face to face time and time spent for planning, charting and co-ordination of care   Tamsen Meek, MD 11/22/22

## 2022-11-29 ENCOUNTER — Ambulatory Visit (HOSPITAL_COMMUNITY)
Admission: RE | Admit: 2022-11-29 | Discharge: 2022-11-29 | Disposition: A | Discharge status: Home / Self Care | Payer: Medicare PPO | Source: Ambulatory Visit | Attending: Hematology and Oncology | Admitting: Hematology and Oncology

## 2022-11-29 ENCOUNTER — Inpatient Hospital Stay (HOSPITAL_BASED_OUTPATIENT_CLINIC_OR_DEPARTMENT_OTHER): Payer: Medicare PPO | Admitting: Hematology and Oncology

## 2022-11-29 ENCOUNTER — Ambulatory Visit (HOSPITAL_COMMUNITY): Payer: Medicare PPO

## 2022-11-29 DIAGNOSIS — D709 Neutropenia, unspecified: Secondary | ICD-10-CM

## 2022-11-29 DIAGNOSIS — Z170 Estrogen receptor positive status [ER+]: Secondary | ICD-10-CM

## 2022-11-29 DIAGNOSIS — C50511 Malignant neoplasm of lower-outer quadrant of right female breast: Secondary | ICD-10-CM

## 2022-11-29 DIAGNOSIS — R55 Syncope and collapse: Secondary | ICD-10-CM | POA: Insufficient documentation

## 2022-11-29 MED ORDER — GADOBUTROL 1 MMOL/ML IV SOLN
7.0000 mL | Freq: Once | INTRAVENOUS | Status: AC | PRN
Start: 2022-11-29 — End: 2022-11-29
  Administered 2022-11-29: 7 mL via INTRAVENOUS

## 2022-11-29 NOTE — Assessment & Plan Note (Signed)
01/29/2021: Screening mammogram detected 1.2 cm right breast mass: Grade 2 IDC with DCIS ER 90%, PR 30%, HER2 negative Oncotype score: 28: 17% risk of distant recurrence at 10 years     Treatment plan: 1.  Breast conserving surgery with sentinel lymph node biopsy  03/02/2021: Right lumpectomy: Grade 2 IDC 1.2 cm, intermediate grade DCIS, focal anterior and medial margin positive, 1/4 lymph nodes positive 03/31/2021: Reexcision margins: Benign 2. Adjuvant chemotherapy with Taxotere and Cytoxan every 3 weeks x4 cycles completed 06/25/2021 3.  Adjuvant radiation 07/30/2021-09/13/2021 4.  Followed by adjuvant antiestrogen therapy with anastrozole started October 2023 ---------------------------------------------------------------------------------------------------------- Anastrozole toxicities: Diffuse musculoskeletal pains  Chemo-induced peripheral neuropathy: Stable Limitation of right shoulder extension  Return to clinic in 1 year for follow-up

## 2022-11-29 NOTE — Progress Notes (Signed)
HEMATOLOGY-ONCOLOGY TELEPHONE VISIT PROGRESS NOTE  I connected with our patient on 11/29/22 at  8:00 AM EST by telephone and verified that I am speaking with the correct person using two identifiers.  I discussed the limitations, risks, security and privacy concerns of performing an evaluation and management service by telephone and the availability of in person appointments.  I also discussed with the patient that there may be a patient responsible charge related to this service. The patient expressed understanding and agreed to proceed.   History of Present Illness: Dizziness and lightheadedness  History of Present Illness   The patient, with history of breast cancer and neutropenia, reports ongoing dizziness and balance issues. She describes feeling faint and as if she is going to fall. The severity of these symptoms varies from day to day. An MRI was previously ordered but has not yet been scheduled or performed.        Oncology History  Malignant neoplasm of lower-outer quadrant of right breast of female, estrogen receptor positive (HCC)  01/29/2021 Initial Diagnosis   Screening mammogram detected right breast mass 1.2 cm, axilla normal, biopsy revealed grade 2 IDC with DCIS ER 90%, PR 30%, HER2 2+ by IHC FISH negative   02/08/2021 Cancer Staging   Staging form: Breast, AJCC 8th Edition - Clinical stage from 02/08/2021: Stage IA (cT1c, cN0, cM0, G2, ER+, PR+, HER2: Equivocal) - Signed by Serena Croissant, MD on 02/08/2021 Stage prefix: Initial diagnosis Histologic grading system: 3 grade system   03/02/2021 Surgery   Right lumpectomy: Grade 2 IDC 1.2 cm with intermediate grade DCIS, focal involvement of anterior medial margin junction, 1/4 lymph nodes positive   03/19/2021 Oncotype testing   Oncotype DX score: 28.  Distant recurrence at 10 years: 17%   03/24/2021 Cancer Staging   Staging form: Breast, AJCC 8th Edition - Pathologic: Stage IA (pT1c, pN1(sn), cM0, G2, ER+, PR+, HER2-, Oncotype  DX score: 28) - Signed by Serena Croissant, MD on 03/24/2021 Method of lymph node assessment: Sentinel lymph node biopsy Multigene prognostic tests performed: Oncotype DX Recurrence score range: Greater than or equal to 11 Histologic grading system: 3 grade system   04/23/2021 - 06/28/2021 Chemotherapy   Patient is on Treatment Plan : BREAST TC q21d     07/28/2021 - 09/13/2021 Radiation Therapy   Site Technique Total Dose (Gy) Dose per Fx (Gy) Completed Fx Beam Energies  Breast, Right: Breast_R 3D 50.4/50.4 1.8 28/28 6X, 10X  Breast, Right: Breast_R_SCLV 3D 50.4/50.4 1.8 28/28 6X, 10X  Breast, Right: Breast_R_Bst 3D 10/10 2 5/5 6X, 10X     09/2021 -  Anti-estrogen oral therapy   Anastrozole x 7 years     REVIEW OF SYSTEMS:   Constitutional: Denies fevers, chills or abnormal weight loss All other systems were reviewed with the patient and are negative. Observations/Objective:     Assessment Plan:  Malignant neoplasm of lower-outer quadrant of right breast of female, estrogen receptor positive (HCC) 01/29/2021: Screening mammogram detected 1.2 cm right breast mass: Grade 2 IDC with DCIS ER 90%, PR 30%, HER2 negative Oncotype score: 28: 17% risk of distant recurrence at 10 years     Treatment plan: 1.  Breast conserving surgery with sentinel lymph node biopsy  03/02/2021: Right lumpectomy: Grade 2 IDC 1.2 cm, intermediate grade DCIS, focal anterior and medial margin positive, 1/4 lymph nodes positive 03/31/2021: Reexcision margins: Benign 2. Adjuvant chemotherapy with Taxotere and Cytoxan every 3 weeks x4 cycles completed 06/25/2021 3.  Adjuvant radiation 07/30/2021-09/13/2021 4.  Followed  by adjuvant antiestrogen therapy with anastrozole started October 2023 ---------------------------------------------------------------------------------------------------------- Anastrozole toxicities: Diffuse musculoskeletal pains  Chemo-induced peripheral neuropathy: Stable Limitation of right shoulder  extension   Return to clinic in 1 year for follow-up  Neutropenia (HCC) Lab review: 12/18/2014: WBC 3.2 03/31/2021: WBC 4.1 Chemotherapy with Taxotere and Cytoxan 04/23/2021 -06/25/2021 09/09/2021: WBC 2.1, hemoglobin 12, ANC 1.4, ALC 0.4 12/22/2021: WBC 2.7, hemoglobin 11.8, platelets 203, ANC 1.5, ALC 0.8 01/21/2022: WBC 2.1, ANC 1, hemoglobin 11.9, platelets 193 02/04/22: WBC: 2.4, Hb 12.2, ANC 1.3 11/22/2022: WBC 3.4, hemoglobin 12.8, ANC 1.6   Bone Marrow: Mildly hypercellular marrow with Trilineage Hematopoeisis, cytogenetics and FISH for MDS are pending   Discussion: Most likely cause of the is prior chemotherapy.    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  Patient is awaiting phone call from [place] to get this appointment.  She continues to have the dizziness and disorientation today.  Therefore we will try and get this scheduled ASAP.   Telephone call after the MRI is done to discuss results.  I discussed the assessment and treatment plan with the patient. The patient was provided an opportunity to ask questions and all were answered. The patient agreed with the plan and demonstrated an understanding of the instructions. The patient was advised to call back or seek an in-person evaluation if the symptoms worsen or if the condition fails to improve as anticipated.   I provided 12 minutes of non-face-to-face time during this encounter.  This includes time for charting and coordination of care   Tamsen Meek, MD

## 2022-11-29 NOTE — Assessment & Plan Note (Addendum)
Lab review: 12/18/2014: WBC 3.2 03/31/2021: WBC 4.1 Chemotherapy with Taxotere and Cytoxan 04/23/2021 -06/25/2021 09/09/2021: WBC 2.1, hemoglobin 12, ANC 1.4, ALC 0.4 12/22/2021: WBC 2.7, hemoglobin 11.8, platelets 203, ANC 1.5, ALC 0.8 01/21/2022: WBC 2.1, ANC 1, hemoglobin 11.9, platelets 193 02/04/22: WBC: 2.4, Hb 12.2, ANC 1.3 11/22/2022: WBC 3.4, hemoglobin 12.8, ANC 1.6   Bone Marrow: Mildly hypercellular marrow with Trilineage Hematopoeisis, cytogenetics and FISH for MDS are pending   Discussion: Most likely cause of the is prior chemotherapy.    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  Patient is awaiting phone call from [place] to get this appointment.  She continues to have the dizziness and disorientation today.  Therefore we will try and get this scheduled ASAP.   Telephone call after the MRI is done to discuss results.

## 2022-11-30 ENCOUNTER — Inpatient Hospital Stay (HOSPITAL_BASED_OUTPATIENT_CLINIC_OR_DEPARTMENT_OTHER): Payer: Medicare PPO | Admitting: Hematology and Oncology

## 2022-11-30 DIAGNOSIS — C50511 Malignant neoplasm of lower-outer quadrant of right female breast: Secondary | ICD-10-CM

## 2022-11-30 DIAGNOSIS — Z170 Estrogen receptor positive status [ER+]: Secondary | ICD-10-CM | POA: Diagnosis not present

## 2022-11-30 NOTE — Assessment & Plan Note (Signed)
01/29/2021: Screening mammogram detected 1.2 cm right breast mass: Grade 2 IDC with DCIS ER 90%, PR 30%, HER2 negative Oncotype score: 28: 17% risk of distant recurrence at 10 years     Treatment plan: 1.  Breast conserving surgery with sentinel lymph node biopsy  03/02/2021: Right lumpectomy: Grade 2 IDC 1.2 cm, intermediate grade DCIS, focal anterior and medial margin positive, 1/4 lymph nodes positive 03/31/2021: Reexcision margins: Benign 2. Adjuvant chemotherapy with Taxotere and Cytoxan every 3 weeks x4 cycles completed 06/25/2021 3.  Adjuvant radiation 07/30/2021-09/13/2021 4.  Followed by adjuvant antiestrogen therapy with anastrozole started October 2023 ---------------------------------------------------------------------------------------------------------- Anastrozole toxicities: Diffuse musculoskeletal pains  Chemo-induced peripheral neuropathy: Stable Limitation of right shoulder extension  Dizziness and lightheadedness: Brain MRI 11/29/2022:

## 2022-11-30 NOTE — Progress Notes (Signed)
HEMATOLOGY-ONCOLOGY TELEPHONE VISIT PROGRESS NOTE  I connected with our patient on 11/30/22 at 11:15 AM EST by telephone and verified that I am speaking with the correct person using two identifiers.  I discussed the limitations, risks, security and privacy concerns of performing an evaluation and management service by telephone and the availability of in person appointments.  I also discussed with the patient that there may be a patient responsible charge related to this service. The patient expressed understanding and agreed to proceed.   History of Present Illness: Follow-up to discuss results of brain MRI   History of Present Illness   The patient presents for a follow-up visit to discuss the results of a recent brain MRI. She was experiencing symptoms that prompted the imaging, . The patient expresses relief and gratitude upon learning that the MRI results are normal, with no evidence of cancer or stroke.        Oncology History  Malignant neoplasm of lower-outer quadrant of right breast of female, estrogen receptor positive (HCC)  01/29/2021 Initial Diagnosis   Screening mammogram detected right breast mass 1.2 cm, axilla normal, biopsy revealed grade 2 IDC with DCIS ER 90%, PR 30%, HER2 2+ by IHC FISH negative   02/08/2021 Cancer Staging   Staging form: Breast, AJCC 8th Edition - Clinical stage from 02/08/2021: Stage IA (cT1c, cN0, cM0, G2, ER+, PR+, HER2: Equivocal) - Signed by Serena Croissant, MD on 02/08/2021 Stage prefix: Initial diagnosis Histologic grading system: 3 grade system   03/02/2021 Surgery   Right lumpectomy: Grade 2 IDC 1.2 cm with intermediate grade DCIS, focal involvement of anterior medial margin junction, 1/4 lymph nodes positive   03/19/2021 Oncotype testing   Oncotype DX score: 28.  Distant recurrence at 10 years: 17%   03/24/2021 Cancer Staging   Staging form: Breast, AJCC 8th Edition - Pathologic: Stage IA (pT1c, pN1(sn), cM0, G2, ER+, PR+, HER2-, Oncotype DX  score: 28) - Signed by Serena Croissant, MD on 03/24/2021 Method of lymph node assessment: Sentinel lymph node biopsy Multigene prognostic tests performed: Oncotype DX Recurrence score range: Greater than or equal to 11 Histologic grading system: 3 grade system   04/23/2021 - 06/28/2021 Chemotherapy   Patient is on Treatment Plan : BREAST TC q21d     07/28/2021 - 09/13/2021 Radiation Therapy   Site Technique Total Dose (Gy) Dose per Fx (Gy) Completed Fx Beam Energies  Breast, Right: Breast_R 3D 50.4/50.4 1.8 28/28 6X, 10X  Breast, Right: Breast_R_SCLV 3D 50.4/50.4 1.8 28/28 6X, 10X  Breast, Right: Breast_R_Bst 3D 10/10 2 5/5 6X, 10X     09/2021 -  Anti-estrogen oral therapy   Anastrozole x 7 years     REVIEW OF SYSTEMS:   Constitutional: Denies fevers, chills or abnormal weight loss All other systems were reviewed with the patient and are negative. Observations/Objective:     Assessment Plan:  Malignant neoplasm of lower-outer quadrant of right breast of female, estrogen receptor positive (HCC) 01/29/2021: Screening mammogram detected 1.2 cm right breast mass: Grade 2 IDC with DCIS ER 90%, PR 30%, HER2 negative Oncotype score: 28: 17% risk of distant recurrence at 10 years     Treatment plan: 1.  Breast conserving surgery with sentinel lymph node biopsy  03/02/2021: Right lumpectomy: Grade 2 IDC 1.2 cm, intermediate grade DCIS, focal anterior and medial margin positive, 1/4 lymph nodes positive 03/31/2021: Reexcision margins: Benign 2. Adjuvant chemotherapy with Taxotere and Cytoxan every 3 weeks x4 cycles completed 06/25/2021 3.  Adjuvant radiation 07/30/2021-09/13/2021 4.  Followed  by adjuvant antiestrogen therapy with anastrozole started October 2023 ---------------------------------------------------------------------------------------------------------- Anastrozole toxicities: Diffuse musculoskeletal pains  Chemo-induced peripheral neuropathy: Stable Limitation of right shoulder  extension  Dizziness and lightheadedness: Brain MRI 11/29/2022: No evidence of any intracranial abnormalities to explain the cause of the dizziness or lightheadedness.   -Recommend follow-up with primary care doctor for further evaluation of dizziness/lightheadedness. -Scheduled follow-up in May next year.          I discussed the assessment and treatment plan with the patient. The patient was provided an opportunity to ask questions and all were answered. The patient agreed with the plan and demonstrated an understanding of the instructions. The patient was advised to call back or seek an in-person evaluation if the symptoms worsen or if the condition fails to improve as anticipated.   I provided 12 minutes of non-face-to-face time during this encounter.  This includes time for charting and coordination of care   Tamsen Meek, MD

## 2022-12-13 DIAGNOSIS — E559 Vitamin D deficiency, unspecified: Secondary | ICD-10-CM | POA: Diagnosis not present

## 2022-12-13 DIAGNOSIS — F419 Anxiety disorder, unspecified: Secondary | ICD-10-CM | POA: Diagnosis not present

## 2022-12-13 DIAGNOSIS — C50911 Malignant neoplasm of unspecified site of right female breast: Secondary | ICD-10-CM | POA: Diagnosis not present

## 2022-12-13 DIAGNOSIS — F331 Major depressive disorder, recurrent, moderate: Secondary | ICD-10-CM | POA: Diagnosis not present

## 2022-12-13 DIAGNOSIS — E039 Hypothyroidism, unspecified: Secondary | ICD-10-CM | POA: Diagnosis not present

## 2022-12-13 DIAGNOSIS — G620 Drug-induced polyneuropathy: Secondary | ICD-10-CM | POA: Diagnosis not present

## 2022-12-13 DIAGNOSIS — I10 Essential (primary) hypertension: Secondary | ICD-10-CM | POA: Diagnosis not present

## 2022-12-13 DIAGNOSIS — E785 Hyperlipidemia, unspecified: Secondary | ICD-10-CM | POA: Diagnosis not present

## 2022-12-13 DIAGNOSIS — D709 Neutropenia, unspecified: Secondary | ICD-10-CM | POA: Diagnosis not present

## 2023-01-16 ENCOUNTER — Other Ambulatory Visit: Payer: Self-pay | Admitting: Adult Health

## 2023-01-16 DIAGNOSIS — Z9889 Other specified postprocedural states: Secondary | ICD-10-CM

## 2023-01-31 DIAGNOSIS — Z79899 Other long term (current) drug therapy: Secondary | ICD-10-CM | POA: Diagnosis not present

## 2023-01-31 DIAGNOSIS — E785 Hyperlipidemia, unspecified: Secondary | ICD-10-CM | POA: Diagnosis not present

## 2023-01-31 DIAGNOSIS — Z1211 Encounter for screening for malignant neoplasm of colon: Secondary | ICD-10-CM | POA: Diagnosis not present

## 2023-01-31 DIAGNOSIS — E039 Hypothyroidism, unspecified: Secondary | ICD-10-CM | POA: Diagnosis not present

## 2023-02-28 DIAGNOSIS — J019 Acute sinusitis, unspecified: Secondary | ICD-10-CM | POA: Diagnosis not present

## 2023-03-07 ENCOUNTER — Ambulatory Visit
Admission: RE | Admit: 2023-03-07 | Discharge: 2023-03-07 | Disposition: A | Discharge status: Home / Self Care | Payer: Medicare PPO | Source: Ambulatory Visit | Attending: Adult Health | Admitting: Adult Health

## 2023-03-07 DIAGNOSIS — Z853 Personal history of malignant neoplasm of breast: Secondary | ICD-10-CM | POA: Diagnosis not present

## 2023-03-07 DIAGNOSIS — Z9889 Other specified postprocedural states: Secondary | ICD-10-CM

## 2023-03-07 DIAGNOSIS — Z08 Encounter for follow-up examination after completed treatment for malignant neoplasm: Secondary | ICD-10-CM | POA: Diagnosis not present

## 2023-03-13 DIAGNOSIS — C50911 Malignant neoplasm of unspecified site of right female breast: Secondary | ICD-10-CM | POA: Diagnosis not present

## 2023-04-12 DIAGNOSIS — E039 Hypothyroidism, unspecified: Secondary | ICD-10-CM | POA: Diagnosis not present

## 2023-05-16 ENCOUNTER — Inpatient Hospital Stay

## 2023-05-16 ENCOUNTER — Inpatient Hospital Stay: Payer: Medicare PPO | Attending: Hematology and Oncology | Admitting: Hematology and Oncology

## 2023-05-16 VITALS — BP 130/68 | HR 55 | Temp 97.6°F | Resp 18 | Ht 63.0 in | Wt 167.2 lb

## 2023-05-16 DIAGNOSIS — Z9221 Personal history of antineoplastic chemotherapy: Secondary | ICD-10-CM | POA: Insufficient documentation

## 2023-05-16 DIAGNOSIS — Z170 Estrogen receptor positive status [ER+]: Secondary | ICD-10-CM | POA: Diagnosis not present

## 2023-05-16 DIAGNOSIS — Z1732 Human epidermal growth factor receptor 2 negative status: Secondary | ICD-10-CM | POA: Insufficient documentation

## 2023-05-16 DIAGNOSIS — T451X5D Adverse effect of antineoplastic and immunosuppressive drugs, subsequent encounter: Secondary | ICD-10-CM | POA: Diagnosis not present

## 2023-05-16 DIAGNOSIS — C50511 Malignant neoplasm of lower-outer quadrant of right female breast: Secondary | ICD-10-CM | POA: Diagnosis not present

## 2023-05-16 DIAGNOSIS — R109 Unspecified abdominal pain: Secondary | ICD-10-CM | POA: Diagnosis not present

## 2023-05-16 DIAGNOSIS — D1771 Benign lipomatous neoplasm of kidney: Secondary | ICD-10-CM | POA: Diagnosis not present

## 2023-05-16 DIAGNOSIS — Z79899 Other long term (current) drug therapy: Secondary | ICD-10-CM | POA: Insufficient documentation

## 2023-05-16 DIAGNOSIS — D1803 Hemangioma of intra-abdominal structures: Secondary | ICD-10-CM | POA: Diagnosis not present

## 2023-05-16 DIAGNOSIS — Z79811 Long term (current) use of aromatase inhibitors: Secondary | ICD-10-CM | POA: Diagnosis not present

## 2023-05-16 DIAGNOSIS — Z1721 Progesterone receptor positive status: Secondary | ICD-10-CM | POA: Diagnosis not present

## 2023-05-16 DIAGNOSIS — D701 Agranulocytosis secondary to cancer chemotherapy: Secondary | ICD-10-CM | POA: Diagnosis not present

## 2023-05-16 DIAGNOSIS — Z923 Personal history of irradiation: Secondary | ICD-10-CM | POA: Insufficient documentation

## 2023-05-16 DIAGNOSIS — R55 Syncope and collapse: Secondary | ICD-10-CM

## 2023-05-16 LAB — CBC WITH DIFFERENTIAL (CANCER CENTER ONLY)
Abs Immature Granulocytes: 0 10*3/uL (ref 0.00–0.07)
Basophils Absolute: 0 10*3/uL (ref 0.0–0.1)
Basophils Relative: 1 %
Eosinophils Absolute: 0.1 10*3/uL (ref 0.0–0.5)
Eosinophils Relative: 2 %
HCT: 38 % (ref 36.0–46.0)
Hemoglobin: 12.5 g/dL (ref 12.0–15.0)
Immature Granulocytes: 0 %
Lymphocytes Relative: 48 %
Lymphs Abs: 1.3 10*3/uL (ref 0.7–4.0)
MCH: 28.3 pg (ref 26.0–34.0)
MCHC: 32.9 g/dL (ref 30.0–36.0)
MCV: 86.2 fL (ref 80.0–100.0)
Monocytes Absolute: 0.2 10*3/uL (ref 0.1–1.0)
Monocytes Relative: 9 %
Neutro Abs: 1.1 10*3/uL — ABNORMAL LOW (ref 1.7–7.7)
Neutrophils Relative %: 40 %
Platelet Count: 185 10*3/uL (ref 150–400)
RBC: 4.41 MIL/uL (ref 3.87–5.11)
RDW: 13.3 % (ref 11.5–15.5)
WBC Count: 2.7 10*3/uL — ABNORMAL LOW (ref 4.0–10.5)
nRBC: 0 % (ref 0.0–0.2)

## 2023-05-16 LAB — CMP (CANCER CENTER ONLY)
ALT: 16 U/L (ref 0–44)
AST: 20 U/L (ref 15–41)
Albumin: 4.2 g/dL (ref 3.5–5.0)
Alkaline Phosphatase: 103 U/L (ref 38–126)
Anion gap: 5 (ref 5–15)
BUN: 13 mg/dL (ref 8–23)
CO2: 30 mmol/L (ref 22–32)
Calcium: 9.2 mg/dL (ref 8.9–10.3)
Chloride: 107 mmol/L (ref 98–111)
Creatinine: 0.94 mg/dL (ref 0.44–1.00)
GFR, Estimated: >60 mL/min (ref >60)
Glucose, Bld: 88 mg/dL (ref 70–99)
Potassium: 4.1 mmol/L (ref 3.5–5.1)
Sodium: 142 mmol/L (ref 135–145)
Total Bilirubin: 0.4 mg/dL (ref 0.0–1.2)
Total Protein: 7.3 g/dL (ref 6.5–8.1)

## 2023-05-16 NOTE — Progress Notes (Signed)
 Patient Care Team: Cameron Cea, MD as PCP - General (Hematology and Oncology) Cameron Cea, MD as Consulting Physician (Hematology and Oncology) Dareen Ebbing, MD as Consulting Physician (General Surgery) Johna Myers, MD as Consulting Physician (Radiation Oncology)  DIAGNOSIS:  Encounter Diagnosis  Name Primary?   Malignant neoplasm of lower-outer quadrant of right breast of female, estrogen receptor positive (HCC) Yes    SUMMARY OF ONCOLOGIC HISTORY: Oncology History  Malignant neoplasm of lower-outer quadrant of right breast of female, estrogen receptor positive (HCC)  01/29/2021 Initial Diagnosis   Screening mammogram detected right breast mass 1.2 cm, axilla normal, biopsy revealed grade 2 IDC with DCIS ER 90%, PR 30%, HER2 2+ by IHC FISH negative   02/08/2021 Cancer Staging   Staging form: Breast, AJCC 8th Edition - Clinical stage from 02/08/2021: Stage IA (cT1c, cN0, cM0, G2, ER+, PR+, HER2: Equivocal) - Signed by Cameron Cea, MD on 02/08/2021 Stage prefix: Initial diagnosis Histologic grading system: 3 grade system   03/02/2021 Surgery   Right lumpectomy: Grade 2 IDC 1.2 cm with intermediate grade DCIS, focal involvement of anterior medial margin junction, 1/4 lymph nodes positive   03/19/2021 Oncotype testing   Oncotype DX score: 28.  Distant recurrence at 10 years: 17%   03/24/2021 Cancer Staging   Staging form: Breast, AJCC 8th Edition - Pathologic: Stage IA (pT1c, pN1(sn), cM0, G2, ER+, PR+, HER2-, Oncotype DX score: 28) - Signed by Cameron Cea, MD on 03/24/2021 Method of lymph node assessment: Sentinel lymph node biopsy Multigene prognostic tests performed: Oncotype DX Recurrence score range: Greater than or equal to 11 Histologic grading system: 3 grade system   04/23/2021 - 06/28/2021 Chemotherapy   Patient is on Treatment Plan : BREAST TC q21d     07/28/2021 - 09/13/2021 Radiation Therapy   Site Technique Total Dose (Gy) Dose per Fx (Gy) Completed Fx Beam  Energies  Breast, Right: Breast_R 3D 50.4/50.4 1.8 28/28 6X, 10X  Breast, Right: Breast_R_SCLV 3D 50.4/50.4 1.8 28/28 6X, 10X  Breast, Right: Breast_R_Bst 3D 10/10 2 5/5 6X, 10X     09/2021 -  Anti-estrogen oral therapy   Anastrozole  x 7 years     CHIEF COMPLIANT: Follow-up on anastrozole  therapy  HISTORY OF PRESENT ILLNESS:   History of Present Illness Catherine [name] is a 68 year old female with a history of angiomyolipoma and breast cancer who presents with weakness and lightheadedness.  She experiences persistent weakness, lightheadedness, and imbalance. 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 An MRI from November of the previous year showed no abnormalities in the lower abdomen.  Her history of breast cancer includes current treatment with anastrozole  or letrozole . She has a persistently low white blood cell count due to previous chemotherapy, though her neutrophil count remains above one. Her thyroid  and cholesterol levels are monitored by her primary care provider.  She experiences dizziness without falls and a ringing sensation on one side of her head, causing pain when lying on that side. She exercises [facility] a week at the [facility] and uses a treadmill at home.     ALLERGIES:  is allergic to penicillins.  MEDICATIONS:  Current Outpatient Medications  Medication Sig Dispense Refill   anastrozole  (ARIMIDEX ) 1 MG tablet TAKE 1 TABLET(1 MG) BY MOUTH DAILY 90 tablet 3   Calcium  Magnesium  Zinc  333-133-5 MG TABS Take 1 tablet by mouth daily. (Patient taking differently: Take 1 tablet by mouth once a week.)     ergocalciferol  (VITAMIN D2) 1.25 MG (  50000 UT) capsule Take 1 capsule (50,000 Units total) by mouth once a week.     escitalopram (LEXAPRO) 5 MG tablet Take 5 mg by mouth once a week.     levothyroxine  (SYNTHROID ) 50 MCG tablet Take 1 tablet (50 mcg total) by mouth daily before breakfast.     rosuvastatin  (CRESTOR ) 5 MG tablet Take 1 tablet (5  mg total) by mouth daily.     acetaminophen  (TYLENOL ) 500 MG tablet Take 1,000 mg by mouth every 6 (six) hours as needed for moderate pain. (Patient not taking: Reported on 05/16/2023)     metoprolol tartrate (LOPRESSOR) 25 MG tablet Take 12.5 mg by mouth 2 (two) times daily. (Patient not taking: Reported on 05/16/2023)     Current Facility-Administered Medications  Medication Dose Route Frequency Provider Last Rate Last Admin   ondansetron  (ZOFRAN ) injection 8 mg  8 mg Intravenous Once Khyle Goodell, MD       Facility-Administered Medications Ordered in Other Visits  Medication Dose Route Frequency Provider Last Rate Last Admin   ondansetron  (ZOFRAN ) 4 MG/2ML injection            ondansetron  (ZOFRAN ) 4 MG/2ML injection             PHYSICAL EXAMINATION: ECOG PERFORMANCE STATUS: 1 - Symptomatic but completely ambulatory  Vitals:   05/16/23 0928  BP: 130/68  Pulse: (!) 55  Resp: 18  Temp: 97.6 F (36.4 C)  SpO2: 100%   Filed Weights   05/16/23 0928  Weight: 167 lb 3.2 oz (75.8 kg)    Physical Exam ABDOMEN: Tender on palpation.  (exam performed in the presence of a chaperone)  LABORATORY DATA:  I have reviewed the data as listed    Latest Ref Rng & Units 05/16/2023    8:41 AM 11/22/2022    9:22 AM 02/04/2022    8:33 AM  CMP  Glucose 70 - 99 mg/dL 88  80  89   BUN 8 - 23 mg/dL 13  14  15    Creatinine 0.44 - 1.00 mg/dL 4.09  8.11  9.14   Sodium 135 - 145 mmol/L 142  140  141   Potassium 3.5 - 5.1 mmol/L 4.1  4.1  4.1   Chloride 98 - 111 mmol/L 107  106  108   CO2 22 - 32 mmol/L 30  31  28    Calcium  8.9 - 10.3 mg/dL 9.2  9.6  9.4   Total Protein 6.5 - 8.1 g/dL 7.3  7.4  6.6   Total Bilirubin 0.0 - 1.2 mg/dL 0.4  0.5  0.3   Alkaline Phos 38 - 126 U/L 103  111  107   AST 15 - 41 U/L 20  16  14    ALT 0 - 44 U/L 16  12  11      Lab Results  Component Value Date   WBC 2.7 (L) 05/16/2023   HGB 12.5 05/16/2023   HCT 38.0 05/16/2023   MCV 86.2 05/16/2023   PLT 185  05/16/2023   NEUTROABS 1.1 (L) 05/16/2023    ASSESSMENT & PLAN:  Malignant neoplasm of lower-outer quadrant of right breast of female, estrogen receptor positive (HCC) 01/29/2021: Screening mammogram detected 1.2 cm right breast mass: Grade 2 IDC with DCIS ER 90%, PR 30%, HER2 negative Oncotype score: 28: 17% risk of distant recurrence at 10 years     Treatment plan: 1.  Breast conserving surgery with sentinel lymph node biopsy  03/02/2021: Right lumpectomy: Grade 2 IDC 1.2 cm,  intermediate grade DCIS, focal anterior and medial margin positive, 1/4 lymph nodes positive 03/31/2021: Reexcision margins: Benign 2. Adjuvant chemotherapy with Taxotere  and Cytoxan  every 3 weeks x4 cycles completed 06/25/2021 3.  Adjuvant radiation 07/30/2021-09/13/2021 4.  Followed by adjuvant antiestrogen therapy with anastrozole  started October 2023 ---------------------------------------------------------------------------------------------------------- Anastrozole  toxicities: Diffuse musculoskeletal pains  Chemo-induced peripheral neuropathy: Stable Limitation of right shoulder extension   Dizziness and lightheadedness: Brain MRI 11/29/2022: No evidence of any intracranial abnormalities to explain the cause of the dizziness or lightheadedness.   -Recommend follow-up with primary care doctor for further evaluation of dizziness/lightheadedness.  RTC in 1 year ------------------------------------- Assessment and Plan Assessment & Plan Malignant neoplasm of lower-outer quadrant of right breast Breast cancer managed with anastrozole . March mammograms satisfactory. Discussed clinical trial participation with letrozole , but she prefers to avoid experimental treatments. - Continue anastrozole  therapy. - Revisit clinical trial participation for breast cancer treatment when available.  Anastrozole  toxicities Experiencing weakness, lightheadedness, and imbalance, possibly related to anastrozole . Symptoms may also be  due to neuroses or ear issues.  Leukopenia due to chemotherapy Leukopenia with fluctuating white blood cell counts. Current neutrophil count is 1.1, sufficient to prevent infections. - Monitor white blood cell count and basic blood work.  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 Reports some discomfort. Surgery not recommended unless necessary. - Order MRI of the abdomen to assess the angiomyolipoma.  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 - Include spleen in the MRI of the abdomen to monitor the hemangioma.      No orders of the defined types were placed in this encounter.  The patient has a good understanding of the overall plan. she agrees with it. she will call with any problems that may develop before the next visit here. Total time spent: 30 mins including face to face time and time spent for planning, charting and co-ordination of care   Viinay K Holten Spano, MD 05/16/23

## 2023-05-16 NOTE — Assessment & Plan Note (Signed)
 01/29/2021: Screening mammogram detected 1.2 cm right breast mass: Grade 2 IDC with DCIS ER 90%, PR 30%, HER2 negative Oncotype score: 28: 17% risk of distant recurrence at 10 years     Treatment plan: 1.  Breast conserving surgery with sentinel lymph node biopsy  03/02/2021: Right lumpectomy: Grade 2 IDC 1.2 cm, intermediate grade DCIS, focal anterior and medial margin positive, 1/4 lymph nodes positive 03/31/2021: Reexcision margins: Benign 2. Adjuvant chemotherapy with Taxotere  and Cytoxan  every 3 weeks x4 cycles completed 06/25/2021 3.  Adjuvant radiation 07/30/2021-09/13/2021 4.  Followed by adjuvant antiestrogen therapy with anastrozole  started October 2023 ---------------------------------------------------------------------------------------------------------- Anastrozole  toxicities: Diffuse musculoskeletal pains  Chemo-induced peripheral neuropathy: Stable Limitation of right shoulder extension   Dizziness and lightheadedness: Brain MRI 11/29/2022: No evidence of any intracranial abnormalities to explain the cause of the dizziness or lightheadedness.   -Recommend follow-up with primary care doctor for further evaluation of dizziness/lightheadedness.  RTC in 1 year

## 2023-05-17 LAB — THYROID PANEL WITH TSH
Free Thyroxine Index: 2.6 (ref 1.2–4.9)
T3 Uptake Ratio: 32 % (ref 24–39)
T4, Total: 8.1 ug/dL (ref 4.5–12.0)
TSH: 1.48 u[IU]/mL (ref 0.450–4.500)

## 2023-05-26 ENCOUNTER — Ambulatory Visit (HOSPITAL_COMMUNITY)
Admission: RE | Admit: 2023-05-26 | Discharge: 2023-05-26 | Disposition: A | Discharge status: Home / Self Care | Source: Ambulatory Visit | Attending: Hematology and Oncology | Admitting: Hematology and Oncology

## 2023-05-26 DIAGNOSIS — D7389 Other diseases of spleen: Secondary | ICD-10-CM | POA: Diagnosis not present

## 2023-05-26 DIAGNOSIS — D1771 Benign lipomatous neoplasm of kidney: Secondary | ICD-10-CM | POA: Diagnosis not present

## 2023-05-26 DIAGNOSIS — R109 Unspecified abdominal pain: Secondary | ICD-10-CM | POA: Insufficient documentation

## 2023-05-26 MED ORDER — GADOBUTROL 1 MMOL/ML IV SOLN
7.0000 mL | Freq: Once | INTRAVENOUS | Status: AC | PRN
  Administered 2023-05-26: 7 mL via INTRAVENOUS

## 2023-06-06 ENCOUNTER — Inpatient Hospital Stay: Attending: Hematology and Oncology | Admitting: Hematology and Oncology

## 2023-06-06 DIAGNOSIS — Z170 Estrogen receptor positive status [ER+]: Secondary | ICD-10-CM | POA: Diagnosis not present

## 2023-06-06 DIAGNOSIS — C50511 Malignant neoplasm of lower-outer quadrant of right female breast: Secondary | ICD-10-CM | POA: Diagnosis not present

## 2023-06-06 NOTE — Assessment & Plan Note (Signed)
 01/29/2021: Screening mammogram detected 1.2 cm right breast mass: Grade 2 IDC with DCIS ER 90%, PR 30%, HER2 negative Oncotype score: 28: 17% risk of distant recurrence at 10 years     Treatment plan: 1.  Breast conserving surgery with sentinel lymph node biopsy  03/02/2021: Right lumpectomy: Grade 2 IDC 1.2 cm, intermediate grade DCIS, focal anterior and medial margin positive, 1/4 lymph nodes positive 03/31/2021: Reexcision margins: Benign 2. Adjuvant chemotherapy with Taxotere  and Cytoxan  every 3 weeks x4 cycles completed 06/25/2021 3.  Adjuvant radiation 07/30/2021-09/13/2021 4.  Followed by adjuvant antiestrogen therapy with anastrozole  started October 2023 ---------------------------------------------------------------------------------------------------------- Anastrozole  toxicities: Diffuse musculoskeletal pains  Chemo-induced peripheral neuropathy: Stable Limitation of right shoulder extension   Dizziness and lightheadedness: Brain MRI 11/29/2022: No evidence of any intracranial abnormalities to explain the cause of the dizziness or lightheadedness.   Angiomyolipoma of right kidney: MRI abdomen: 05/29/2023: Unchanged macroscopic fat-containing angiomyolipoma arising from superior pole of the right kidney 6.1 x 3.9 cm.  Unchanged calcified lesion central spleen 2.9 cm.  (Sclerosed hemangioma)  Recommend follow-up with primary care doctor for further evaluation of dizziness/lightheadedness.   RTC in 1 year

## 2023-06-06 NOTE — Progress Notes (Signed)
 HEMATOLOGY-ONCOLOGY TELEPHONE VISIT PROGRESS NOTE  I connected with our patient on 06/06/23 at  8:45 AM EDT by telephone and verified that I am speaking with the correct person using two identifiers.  I discussed the limitations, risks, security and privacy concerns of performing an evaluation and management service by telephone and the availability of in person appointments.  I also discussed with the patient that there may be a patient responsible charge related to this service. The patient expressed understanding and agreed to proceed.   History of Present Illness: Follow-up to discuss result of recent abdominal MRI  History of Present Illness Jai Bear is a 68 year old female who presents for follow-up of her MRI results.  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 She has no new symptoms or changes in her condition related to this finding.  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 She has no symptoms or issues associated with this finding.    Oncology History  Malignant neoplasm of lower-outer quadrant of right breast of female, estrogen receptor positive (HCC)  01/29/2021 Initial Diagnosis   Screening mammogram detected right breast mass 1.2 cm, axilla normal, biopsy revealed grade 2 IDC with DCIS ER 90%, PR 30%, HER2 2+ by IHC FISH negative   02/08/2021 Cancer Staging   Staging form: Breast, AJCC 8th Edition - Clinical stage from 02/08/2021: Stage IA (cT1c, cN0, cM0, G2, ER+, PR+, HER2: Equivocal) - Signed by Cameron Cea, MD on 02/08/2021 Stage prefix: Initial diagnosis Histologic grading system: 3 grade system   03/02/2021 Surgery   Right lumpectomy: Grade 2 IDC 1.2 cm with intermediate grade DCIS, focal involvement of anterior medial margin junction, 1/4 lymph nodes positive   03/19/2021 Oncotype testing   Oncotype DX score: 28.  Distant  recurrence at 10 years: 17%   03/24/2021 Cancer Staging   Staging form: Breast, AJCC 8th Edition - Pathologic: Stage IA (pT1c, pN1(sn), cM0, G2, ER+, PR+, HER2-, Oncotype DX score: 28) - Signed by Cameron Cea, MD on 03/24/2021 Method of lymph node assessment: Sentinel lymph node biopsy Multigene prognostic tests performed: Oncotype DX Recurrence score range: Greater than or equal to 11 Histologic grading system: 3 grade system   04/23/2021 - 06/28/2021 Chemotherapy   Patient is on Treatment Plan : BREAST TC q21d     07/28/2021 - 09/13/2021 Radiation Therapy   Site Technique Total Dose (Gy) Dose per Fx (Gy) Completed Fx Beam Energies  Breast, Right: Breast_R 3D 50.4/50.4 1.8 28/28 6X, 10X  Breast, Right: Breast_R_SCLV 3D 50.4/50.4 1.8 28/28 6X, 10X  Breast, Right: Breast_R_Bst 3D 10/10 2 5/5 6X, 10X     09/2021 -  Anti-estrogen oral therapy   Anastrozole  x 7 years     REVIEW OF SYSTEMS:   Constitutional: Denies fevers, chills or abnormal weight loss All other systems were reviewed with the patient and are negative. Observations/Objective:     Assessment Plan:  Malignant neoplasm of lower-outer quadrant of right breast of female, estrogen receptor positive (HCC) 01/29/2021: Screening mammogram detected 1.2 cm right breast mass: Grade 2 IDC with DCIS ER 90%, PR 30%, HER2 negative Oncotype score: 28: 17% risk of distant recurrence at 10 years     Treatment plan: 1.  Breast conserving surgery with sentinel lymph node biopsy  03/02/2021: Right lumpectomy: Grade 2 IDC 1.2 cm, intermediate grade DCIS, focal anterior and medial margin positive, 1/4 lymph nodes  positive 03/31/2021: Reexcision margins: Benign 2. Adjuvant chemotherapy with Taxotere  and Cytoxan  every 3 weeks x4 cycles completed 06/25/2021 3.  Adjuvant radiation 07/30/2021-09/13/2021 4.  Followed by adjuvant antiestrogen therapy with anastrozole  started October  2023 ---------------------------------------------------------------------------------------------------------- Anastrozole  toxicities: Diffuse musculoskeletal pains  Chemo-induced peripheral neuropathy: Stable Limitation of right shoulder extension   Dizziness and lightheadedness: Brain MRI 11/29/2022: No evidence of any intracranial abnormalities to explain the cause of the dizziness or lightheadedness.   Angiomyolipoma of right kidney: MRI abdomen: 05/29/2023: Unchanged macroscopic fat-containing angiomyolipoma arising from superior pole of the right kidney 6.1 x 3.9 cm.  Unchanged calcified lesion central spleen 2.9 cm.  (Sclerosed hemangioma) I sent a message to Dr. Rozanne Corners to review the MRI and discuss if any additional interventions are needed.  Also to find out how often should be repeat the MRIs.     RTC in 1 year     I discussed the assessment and treatment plan with the patient. The patient was provided an opportunity to ask questions and all were answered. The patient agreed with the plan and demonstrated an understanding of the instructions. The patient was advised to call back or seek an in-person evaluation if the symptoms worsen or if the condition fails to improve as anticipated.   I provided 20 minutes of non-face-to-face time during this encounter.  This includes time for charting and coordination of care   Margert Sheerer, MD

## 2023-06-14 DIAGNOSIS — G620 Drug-induced polyneuropathy: Secondary | ICD-10-CM | POA: Diagnosis not present

## 2023-06-14 DIAGNOSIS — C50911 Malignant neoplasm of unspecified site of right female breast: Secondary | ICD-10-CM | POA: Diagnosis not present

## 2023-06-14 DIAGNOSIS — Z23 Encounter for immunization: Secondary | ICD-10-CM | POA: Diagnosis not present

## 2023-06-14 DIAGNOSIS — E039 Hypothyroidism, unspecified: Secondary | ICD-10-CM | POA: Diagnosis not present

## 2023-06-14 DIAGNOSIS — F419 Anxiety disorder, unspecified: Secondary | ICD-10-CM | POA: Diagnosis not present

## 2023-06-14 DIAGNOSIS — I10 Essential (primary) hypertension: Secondary | ICD-10-CM | POA: Diagnosis not present

## 2023-06-14 DIAGNOSIS — F331 Major depressive disorder, recurrent, moderate: Secondary | ICD-10-CM | POA: Diagnosis not present

## 2023-06-14 DIAGNOSIS — E785 Hyperlipidemia, unspecified: Secondary | ICD-10-CM | POA: Diagnosis not present

## 2023-07-12 ENCOUNTER — Other Ambulatory Visit: Payer: Self-pay | Admitting: *Deleted

## 2023-07-12 MED ORDER — GABAPENTIN 100 MG PO CAPS: 100.0000 mg | ORAL_CAPSULE | Freq: Three times a day (TID) | ORAL | 1 refills | Status: AC

## 2023-07-12 NOTE — Progress Notes (Signed)
 Received call from pt with complaint of increase in neuropathy symptoms including trouble walking due to numbness and tingling in her feet and is also experiencing numbness and tingling in bilateral hands.  Pt states in the past she would take Gabapentin  300 mg p.o at bedtime but that did not control symptoms throughout the day.  Per MD, pt to be prescribed Gabapentin  100 mg p.o TID and f/u in 6 weeks.  Prescription sent to pharmacy on file, pt educated and verbalized understanding.

## 2023-08-23 ENCOUNTER — Inpatient Hospital Stay: Attending: Hematology and Oncology

## 2023-08-23 ENCOUNTER — Inpatient Hospital Stay: Attending: Hematology and Oncology | Admitting: Hematology and Oncology

## 2023-08-23 VITALS — BP 142/83 | HR 54 | Temp 97.3°F | Resp 17 | Wt 170.9 lb

## 2023-08-23 DIAGNOSIS — Z170 Estrogen receptor positive status [ER+]: Secondary | ICD-10-CM | POA: Insufficient documentation

## 2023-08-23 DIAGNOSIS — C50511 Malignant neoplasm of lower-outer quadrant of right female breast: Secondary | ICD-10-CM | POA: Diagnosis not present

## 2023-08-23 DIAGNOSIS — Z923 Personal history of irradiation: Secondary | ICD-10-CM | POA: Insufficient documentation

## 2023-08-23 DIAGNOSIS — Z1732 Human epidermal growth factor receptor 2 negative status: Secondary | ICD-10-CM | POA: Diagnosis not present

## 2023-08-23 DIAGNOSIS — Z79811 Long term (current) use of aromatase inhibitors: Secondary | ICD-10-CM | POA: Diagnosis not present

## 2023-08-23 DIAGNOSIS — Z9221 Personal history of antineoplastic chemotherapy: Secondary | ICD-10-CM | POA: Diagnosis not present

## 2023-08-23 DIAGNOSIS — Z1721 Progesterone receptor positive status: Secondary | ICD-10-CM | POA: Diagnosis not present

## 2023-08-23 LAB — CBC WITH DIFFERENTIAL (CANCER CENTER ONLY)
Abs Immature Granulocytes: 0 K/uL (ref 0.00–0.07)
Basophils Absolute: 0 K/uL (ref 0.0–0.1)
Basophils Relative: 1 %
Eosinophils Absolute: 0.1 K/uL (ref 0.0–0.5)
Eosinophils Relative: 2 %
HCT: 37.8 % (ref 36.0–46.0)
Hemoglobin: 12.3 g/dL (ref 12.0–15.0)
Immature Granulocytes: 0 %
Lymphocytes Relative: 50 %
Lymphs Abs: 1.4 K/uL (ref 0.7–4.0)
MCH: 28 pg (ref 26.0–34.0)
MCHC: 32.5 g/dL (ref 30.0–36.0)
MCV: 86.1 fL (ref 80.0–100.0)
Monocytes Absolute: 0.3 K/uL (ref 0.1–1.0)
Monocytes Relative: 10 %
Neutro Abs: 1 K/uL — ABNORMAL LOW (ref 1.7–7.7)
Neutrophils Relative %: 37 %
Platelet Count: 170 K/uL (ref 150–400)
RBC: 4.39 MIL/uL (ref 3.87–5.11)
RDW: 13.3 % (ref 11.5–15.5)
WBC Count: 2.7 K/uL — ABNORMAL LOW (ref 4.0–10.5)
nRBC: 0 % (ref 0.0–0.2)

## 2023-08-23 LAB — CMP (CANCER CENTER ONLY)
ALT: 17 U/L (ref 0–44)
AST: 18 U/L (ref 15–41)
Albumin: 4.1 g/dL (ref 3.5–5.0)
Alkaline Phosphatase: 108 U/L (ref 38–126)
Anion gap: 5 (ref 5–15)
BUN: 12 mg/dL (ref 8–23)
CO2: 29 mmol/L (ref 22–32)
Calcium: 9.2 mg/dL (ref 8.9–10.3)
Chloride: 105 mmol/L (ref 98–111)
Creatinine: 0.8 mg/dL (ref 0.44–1.00)
GFR, Estimated: >60 mL/min (ref >60)
Glucose, Bld: 93 mg/dL (ref 70–99)
Potassium: 4.3 mmol/L (ref 3.5–5.1)
Sodium: 139 mmol/L (ref 135–145)
Total Bilirubin: 0.4 mg/dL (ref 0.0–1.2)
Total Protein: 7.1 g/dL (ref 6.5–8.1)

## 2023-08-23 MED ORDER — AMOXICILLIN 500 MG PO CAPS: 500.0000 mg | ORAL_CAPSULE | Freq: Two times a day (BID) | ORAL | 0 refills | Status: AC

## 2023-08-23 NOTE — Progress Notes (Signed)
 Patient Care Team: Odean Potts, MD as PCP - General (Hematology and Oncology) Odean Potts, MD as Consulting Physician (Hematology and Oncology) Belinda Cough, MD as Consulting Physician (General Surgery) Dewey Rush, MD as Consulting Physician (Radiation Oncology)  DIAGNOSIS:  Encounter Diagnosis  Name Primary?   Malignant neoplasm of lower-outer quadrant of right breast of female, estrogen receptor positive (HCC) Yes    SUMMARY OF ONCOLOGIC HISTORY: Oncology History  Malignant neoplasm of lower-outer quadrant of right breast of female, estrogen receptor positive (HCC)  01/29/2021 Initial Diagnosis   Screening mammogram detected right breast mass 1.2 cm, axilla normal, biopsy revealed grade 2 IDC with DCIS ER 90%, PR 30%, HER2 2+ by IHC FISH negative   02/08/2021 Cancer Staging   Staging form: Breast, AJCC 8th Edition - Clinical stage from 02/08/2021: Stage IA (cT1c, cN0, cM0, G2, ER+, PR+, HER2: Equivocal) - Signed by Odean Potts, MD on 02/08/2021 Stage prefix: Initial diagnosis Histologic grading system: 3 grade system   03/02/2021 Surgery   Right lumpectomy: Grade 2 IDC 1.2 cm with intermediate grade DCIS, focal involvement of anterior medial margin junction, 1/4 lymph nodes positive   03/19/2021 Oncotype testing   Oncotype DX score: 28.  Distant recurrence at 10 years: 17%   03/24/2021 Cancer Staging   Staging form: Breast, AJCC 8th Edition - Pathologic: Stage IA (pT1c, pN1(sn), cM0, G2, ER+, PR+, HER2-, Oncotype DX score: 28) - Signed by Odean Potts, MD on 03/24/2021 Method of lymph node assessment: Sentinel lymph node biopsy Multigene prognostic tests performed: Oncotype DX Recurrence score range: Greater than or equal to 11 Histologic grading system: 3 grade system   04/23/2021 - 06/28/2021 Chemotherapy   Patient is on Treatment Plan : BREAST TC q21d     07/28/2021 - 09/13/2021 Radiation Therapy   Site Technique Total Dose (Gy) Dose per Fx (Gy) Completed Fx Beam  Energies  Breast, Right: Breast_R 3D 50.4/50.4 1.8 28/28 6X, 10X  Breast, Right: Breast_R_SCLV 3D 50.4/50.4 1.8 28/28 6X, 10X  Breast, Right: Breast_R_Bst 3D 10/10 2 5/5 6X, 10X     09/2021 -  Anti-estrogen oral therapy   Anastrozole  x 7 years     CHIEF COMPLIANT: Follow-up on anastrozole  therapy  HISTORY OF PRESENT ILLNESS:  History of Present Illness Catherine [name] is a 68 year old female with breast cancer on anastrozole  who presents with dizziness and lightheadedness.  She experiences dizziness and lightheadedness, which she associates with a low heart rate. She sometimes has to grasp for breath during these episodes. She is concerned about her heart health, especially given her mother's history of heart-related issues.  She continues to have muscle aches and pains, which she attributes to anastrozole . Her hands often feel very cold.  Her medication history includes anastrozole  and a past allergy to penicillin, though she tolerates amoxicillin .     ALLERGIES:  is allergic to penicillins.  MEDICATIONS:  Current Outpatient Medications  Medication Sig Dispense Refill   acetaminophen  (TYLENOL ) 500 MG tablet Take 1,000 mg by mouth every 6 (six) hours as needed for moderate pain. (Patient not taking: Reported on 05/16/2023)     anastrozole  (ARIMIDEX ) 1 MG tablet TAKE 1 TABLET(1 MG) BY MOUTH DAILY 90 tablet 3   Calcium  Magnesium  Zinc  333-133-5 MG TABS Take 1 tablet by mouth daily. (Patient taking differently: Take 1 tablet by mouth once a week.)     ergocalciferol  (VITAMIN D2) 1.25 MG (50000 UT) capsule Take 1 capsule (50,000 Units total) by mouth once a week.  escitalopram (LEXAPRO) 5 MG tablet Take 5 mg by mouth once a week.     gabapentin  (NEURONTIN ) 100 MG capsule Take 1 capsule (100 mg total) by mouth 3 (three) times daily. 90 capsule 1   levothyroxine  (SYNTHROID ) 50 MCG tablet Take 1 tablet (50 mcg total) by mouth daily before breakfast.     metoprolol tartrate (LOPRESSOR) 25  MG tablet Take 12.5 mg by mouth 2 (two) times daily. (Patient not taking: Reported on 05/16/2023)     rosuvastatin  (CRESTOR ) 5 MG tablet Take 1 tablet (5 mg total) by mouth daily.     Current Facility-Administered Medications  Medication Dose Route Frequency Provider Last Rate Last Admin   ondansetron  (ZOFRAN ) injection 8 mg  8 mg Intravenous Once Delynn Olvera, MD       Facility-Administered Medications Ordered in Other Visits  Medication Dose Route Frequency Provider Last Rate Last Admin   ondansetron  (ZOFRAN ) 4 MG/2ML injection            ondansetron  (ZOFRAN ) 4 MG/2ML injection             PHYSICAL EXAMINATION: ECOG PERFORMANCE STATUS: 1 - Symptomatic but completely ambulatory  Vitals:   08/23/23 1026  BP: (!) 142/83  Pulse: (!) 54  Resp: 17  Temp: (!) 97.3 F (36.3 C)  SpO2: 100%   Filed Weights   08/23/23 1026  Weight: 170 lb 14.4 oz (77.5 kg)    Physical Exam HEENT: No infection in the ear. Slight redness in the ear, not infected.  (exam performed in the presence of a chaperone)  LABORATORY DATA:  I have reviewed the data as listed    Latest Ref Rng & Units 05/16/2023    8:41 AM 11/22/2022    9:22 AM 02/04/2022    8:33 AM  CMP  Glucose 70 - 99 mg/dL 88  80  89   BUN 8 - 23 mg/dL 13  14  15    Creatinine 0.44 - 1.00 mg/dL 9.05  8.98  9.16   Sodium 135 - 145 mmol/L 142  140  141   Potassium 3.5 - 5.1 mmol/L 4.1  4.1  4.1   Chloride 98 - 111 mmol/L 107  106  108   CO2 22 - 32 mmol/L 30  31  28    Calcium  8.9 - 10.3 mg/dL 9.2  9.6  9.4   Total Protein 6.5 - 8.1 g/dL 7.3  7.4  6.6   Total Bilirubin 0.0 - 1.2 mg/dL 0.4  0.5  0.3   Alkaline Phos 38 - 126 U/L 103  111  107   AST 15 - 41 U/L 20  16  14    ALT 0 - 44 U/L 16  12  11      Lab Results  Component Value Date   WBC 2.7 (L) 05/16/2023   HGB 12.5 05/16/2023   HCT 38.0 05/16/2023   MCV 86.2 05/16/2023   PLT 185 05/16/2023   NEUTROABS 1.1 (L) 05/16/2023    ASSESSMENT & PLAN:  Malignant neoplasm of  lower-outer quadrant of right breast of female, estrogen receptor positive (HCC) 01/29/2021: Screening mammogram detected 1.2 cm right breast mass: Grade 2 IDC with DCIS ER 90%, PR 30%, HER2 negative Oncotype score: 28: 17% risk of distant recurrence at 10 years     Treatment plan: 1.  Breast conserving surgery with sentinel lymph node biopsy  03/02/2021: Right lumpectomy: Grade 2 IDC 1.2 cm, intermediate grade DCIS, focal anterior and medial margin positive, 1/4 lymph nodes positive 03/31/2021:  Reexcision margins: Benign 2. Adjuvant chemotherapy with Taxotere  and Cytoxan  every 3 weeks x4 cycles completed 06/25/2021 3.  Adjuvant radiation 07/30/2021-09/13/2021 4.  Followed by adjuvant antiestrogen therapy with anastrozole  started October 2023 ---------------------------------------------------------------------------------------------------------- Anastrozole  toxicities: Diffuse musculoskeletal pains  Chemo-induced peripheral neuropathy: Currently on gabapentin  100 mg p.o. 3 times daily Limitation of right shoulder extension   Dizziness and lightheadedness: Brain MRI 11/29/2022: No evidence of any intracranial abnormalities to explain the cause of the dizziness or lightheadedness.   Angiomyolipoma of right kidney: MRI abdomen: 05/29/2023: Unchanged macroscopic fat-containing angiomyolipoma arising from superior pole of the right kidney 6.1 x 3.9 cm.  Unchanged calcified lesion central spleen 2.9 cm.  (Sclerosed hemangioma) I sent a message to Dr. Renda for a consultation.  Also to find out how often should be repeat the MRIs.     RTC in 1 year   ------------------------------------- Assessment and Plan Assessment & Plan Hormone receptor-positive right breast cancer, status post chemotherapy, on anastrozole  Continues on anastrozole . - Continue anastrozole  therapy. - Schedule next mammogram for March.  Anastrozole -induced myalgia and musculoskeletal pain Persistent musculoskeletal pain and  cold sensation in hands due to anastrozole .  Right shoulder stiffness Right shoulder stiffness is improving.  Dizziness and lightheadedness under evaluation for possible cardiac etiology Experiencing dizziness, lightheadedness, and occasional shortness of breath. Heart rate is low at 50 bpm. Family history of cardiac issues. No recent cardiology evaluation. - Order echocardiogram. - Consider cardiology referral based on echocardiogram results.  Right ear pain, likely mild otitis or irritation Right ear pain with slight redness, no infection or discharge. Possible mild otitis or irritation. - Prescribe a course of antibiotics for one week. - Send prescription to [facility] in [place].      No orders of the defined types were placed in this encounter.  The patient has a good understanding of the overall plan. she agrees with it. she will call with any problems that may develop before the next visit here. Total time spent: 30 mins including face to face time and time spent for planning, charting and co-ordination of care   Naomi MARLA Chad, MD 08/23/23

## 2023-08-23 NOTE — Assessment & Plan Note (Signed)
 01/29/2021: Screening mammogram detected 1.2 cm right breast mass: Grade 2 IDC with DCIS ER 90%, PR 30%, HER2 negative Oncotype score: 28: 17% risk of distant recurrence at 10 years     Treatment plan: 1.  Breast conserving surgery with sentinel lymph node biopsy  03/02/2021: Right lumpectomy: Grade 2 IDC 1.2 cm, intermediate grade DCIS, focal anterior and medial margin positive, 1/4 lymph nodes positive 03/31/2021: Reexcision margins: Benign 2. Adjuvant chemotherapy with Taxotere  and Cytoxan  every 3 weeks x4 cycles completed 06/25/2021 3.  Adjuvant radiation 07/30/2021-09/13/2021 4.  Followed by adjuvant antiestrogen therapy with anastrozole  started October 2023 ---------------------------------------------------------------------------------------------------------- Anastrozole  toxicities: Diffuse musculoskeletal pains  Chemo-induced peripheral neuropathy: Currently on gabapentin  100 mg p.o. 3 times daily Limitation of right shoulder extension   Dizziness and lightheadedness: Brain MRI 11/29/2022: No evidence of any intracranial abnormalities to explain the cause of the dizziness or lightheadedness.   Angiomyolipoma of right kidney: MRI abdomen: 05/29/2023: Unchanged macroscopic fat-containing angiomyolipoma arising from superior pole of the right kidney 6.1 x 3.9 cm.  Unchanged calcified lesion central spleen 2.9 cm.  (Sclerosed hemangioma) I sent a message to Dr. Renda to review the MRI and discuss if any additional interventions are needed.  Also to find out how often should be repeat the MRIs.     RTC in 1 year

## 2023-08-29 ENCOUNTER — Telehealth: Payer: Self-pay | Admitting: *Deleted

## 2023-08-29 NOTE — Telephone Encounter (Signed)
 Pt called to make office aware that she needs to cancel upcoming ECHO appt as well as telephone visit for review. Pt verbalized that she will reschedule once husband is out of hospital and stable. Called to cancel pt appt as well as provided pt with number to reschedule. Pt verbalized understanding.

## 2023-09-01 ENCOUNTER — Ambulatory Visit (HOSPITAL_COMMUNITY)

## 2023-09-06 DIAGNOSIS — Z8679 Personal history of other diseases of the circulatory system: Secondary | ICD-10-CM | POA: Diagnosis not present

## 2023-09-06 DIAGNOSIS — E039 Hypothyroidism, unspecified: Secondary | ICD-10-CM | POA: Diagnosis not present

## 2023-09-06 DIAGNOSIS — E785 Hyperlipidemia, unspecified: Secondary | ICD-10-CM | POA: Diagnosis not present

## 2023-09-06 DIAGNOSIS — R55 Syncope and collapse: Secondary | ICD-10-CM | POA: Diagnosis not present

## 2023-09-06 DIAGNOSIS — R002 Palpitations: Secondary | ICD-10-CM | POA: Diagnosis not present

## 2023-09-06 DIAGNOSIS — R42 Dizziness and giddiness: Secondary | ICD-10-CM | POA: Diagnosis not present

## 2023-09-08 ENCOUNTER — Ambulatory Visit (HOSPITAL_COMMUNITY)

## 2023-09-11 ENCOUNTER — Telehealth: Admitting: Hematology and Oncology

## 2023-09-19 DIAGNOSIS — R1032 Left lower quadrant pain: Secondary | ICD-10-CM | POA: Diagnosis not present

## 2023-09-19 DIAGNOSIS — D49511 Neoplasm of unspecified behavior of right kidney: Secondary | ICD-10-CM | POA: Diagnosis not present

## 2023-09-20 DIAGNOSIS — H938X2 Other specified disorders of left ear: Secondary | ICD-10-CM | POA: Diagnosis not present

## 2023-09-20 DIAGNOSIS — R42 Dizziness and giddiness: Secondary | ICD-10-CM | POA: Diagnosis not present

## 2023-09-20 DIAGNOSIS — H9202 Otalgia, left ear: Secondary | ICD-10-CM | POA: Diagnosis not present

## 2023-09-25 ENCOUNTER — Telehealth: Payer: Self-pay | Admitting: *Deleted

## 2023-09-25 NOTE — Telephone Encounter (Signed)
 Patient called to report new right breast redness and nipple pain.  She states that this is new since last visit with Dr Odean.    Will get her scheduled for evaluation with Morna Kendall, NP

## 2023-09-25 NOTE — Telephone Encounter (Signed)
 error

## 2023-09-27 ENCOUNTER — Other Ambulatory Visit: Payer: Self-pay | Admitting: *Deleted

## 2023-09-27 ENCOUNTER — Encounter: Payer: Self-pay | Admitting: Adult Health

## 2023-09-27 ENCOUNTER — Inpatient Hospital Stay

## 2023-09-27 ENCOUNTER — Inpatient Hospital Stay: Attending: Hematology and Oncology | Admitting: Adult Health

## 2023-09-27 VITALS — BP 142/70 | HR 53 | Resp 16

## 2023-09-27 VITALS — BP 130/71 | HR 65 | Temp 97.5°F | Resp 18 | Ht 63.0 in | Wt 169.5 lb

## 2023-09-27 DIAGNOSIS — Z1721 Progesterone receptor positive status: Secondary | ICD-10-CM | POA: Diagnosis not present

## 2023-09-27 DIAGNOSIS — Z79811 Long term (current) use of aromatase inhibitors: Secondary | ICD-10-CM | POA: Insufficient documentation

## 2023-09-27 DIAGNOSIS — C50511 Malignant neoplasm of lower-outer quadrant of right female breast: Secondary | ICD-10-CM | POA: Insufficient documentation

## 2023-09-27 DIAGNOSIS — Z9221 Personal history of antineoplastic chemotherapy: Secondary | ICD-10-CM | POA: Diagnosis not present

## 2023-09-27 DIAGNOSIS — Z1732 Human epidermal growth factor receptor 2 negative status: Secondary | ICD-10-CM | POA: Insufficient documentation

## 2023-09-27 DIAGNOSIS — E860 Dehydration: Secondary | ICD-10-CM | POA: Diagnosis not present

## 2023-09-27 DIAGNOSIS — Z170 Estrogen receptor positive status [ER+]: Secondary | ICD-10-CM | POA: Insufficient documentation

## 2023-09-27 DIAGNOSIS — R509 Fever, unspecified: Secondary | ICD-10-CM | POA: Insufficient documentation

## 2023-09-27 DIAGNOSIS — Z923 Personal history of irradiation: Secondary | ICD-10-CM | POA: Insufficient documentation

## 2023-09-27 MED ORDER — SODIUM CHLORIDE 0.9 % IV SOLN: Freq: Once | INTRAVENOUS | Status: AC

## 2023-09-27 MED ORDER — DOXYCYCLINE HYCLATE 100 MG PO TABS: 100.0000 mg | ORAL_TABLET | Freq: Two times a day (BID) | ORAL | 0 refills | Status: AC

## 2023-09-27 MED ORDER — ONDANSETRON HCL 4 MG/2ML IJ SOLN
8.0000 mg | Freq: Once | INTRAMUSCULAR | Status: AC
  Administered 2023-09-27: 8 mg via INTRAVENOUS
  Filled 2023-09-27: qty 4

## 2023-09-27 MED ORDER — ACETAMINOPHEN 325 MG PO TABS
650.0000 mg | ORAL_TABLET | Freq: Once | ORAL | Status: AC
  Administered 2023-09-27: 650 mg via ORAL
  Filled 2023-09-27: qty 2

## 2023-09-27 NOTE — Patient Instructions (Signed)

## 2023-09-27 NOTE — Progress Notes (Signed)
 Pt presented to [facility] for IVFs. Pt had c/o nausea and 6/10 in her R breast. This RN made Morna Kendall NP aware. Per Morna NP Pt to receive Zofran  8 mg IV and Tylenol  650 mg PO with IVFs today.

## 2023-09-27 NOTE — Progress Notes (Signed)
 [facility] Cancer Follow up:    Catherine Potts, MD [address] [place] [address]   DIAGNOSIS: Cancer Staging  Malignant neoplasm of lower-outer quadrant of right breast of female, estrogen receptor positive (HCC) Staging form: Breast, AJCC 8th Edition - Clinical stage from 02/08/2021: Stage IA (cT1c, cN0, cM0, G2, ER+, PR+, HER2: Equivocal) - Signed by Catherine Potts, MD on 02/08/2021 Stage prefix: Initial diagnosis Histologic grading system: 3 grade system - Pathologic: Stage IA (pT1c, pN1(sn), cM0, G2, ER+, PR+, HER2-, Oncotype DX score: 28) - Signed by Catherine Potts, MD on 03/24/2021 Method of lymph node assessment: Sentinel lymph node biopsy Multigene prognostic tests performed: Oncotype DX Recurrence score range: Greater than or equal to 11 Histologic grading system: 3 grade system    SUMMARY OF ONCOLOGIC HISTORY: Oncology History  Malignant neoplasm of lower-outer quadrant of right breast of female, estrogen receptor positive (HCC)  01/29/2021 Initial Diagnosis   Screening mammogram detected right breast mass 1.2 cm, axilla normal, biopsy revealed grade 2 IDC with DCIS ER 90%, PR 30%, HER2 2+ by IHC FISH negative   02/08/2021 Cancer Staging   Staging form: Breast, AJCC 8th Edition - Clinical stage from 02/08/2021: Stage IA (cT1c, cN0, cM0, G2, ER+, PR+, HER2: Equivocal) - Signed by Catherine Potts, MD on 02/08/2021 Stage prefix: Initial diagnosis Histologic grading system: 3 grade system   03/02/2021 Surgery   Right lumpectomy: Grade 2 IDC 1.2 cm with intermediate grade DCIS, focal involvement of anterior medial margin junction, 1/4 lymph nodes positive   03/19/2021 Oncotype testing   Oncotype DX score: 28.  Distant recurrence at 10 years: 17%   03/24/2021 Cancer Staging   Staging form: Breast, AJCC 8th Edition - Pathologic: Stage IA (pT1c, pN1(sn), cM0, G2, ER+, PR+, HER2-, Oncotype DX score: 28) - Signed by Catherine Potts, MD on 03/24/2021 Method of  lymph node assessment: Sentinel lymph node biopsy Multigene prognostic tests performed: Oncotype DX Recurrence score range: Greater than or equal to 11 Histologic grading system: 3 grade system   04/23/2021 - 06/28/2021 Chemotherapy   Patient is on Treatment Plan : BREAST TC q21d     07/28/2021 - 09/13/2021 Radiation Therapy   Site Technique Total Dose (Gy) Dose per Fx (Gy) Completed Fx Beam Energies  Breast, Right: Breast_R 3D 50.4/50.4 1.8 28/28 6X, 10X  Breast, Right: Breast_R_SCLV 3D 50.4/50.4 1.8 28/28 6X, 10X  Breast, Right: Breast_R_Bst 3D 10/10 2 5/5 6X, 10X     09/2021 -  Anti-estrogen oral therapy   Anastrozole  x 7 years     CURRENT THERAPY: anastrozole   INTERVAL HISTORY:  Discussed the use of AI scribe software for clinical note transcription with the patient, who gave verbal consent to proceed.  History of Present Illness Catherine [name] is a 68 year old female with a history of right-sided breast cancer who presents with breast changes.   Since Friday, she experiences pain, tenderness, redness, and swelling in the breast, described as 'real red and very thin.' She also has fever and chills.  She feels dehydrated with dizziness, weakness, and nausea.  She was diagnosed with right-sided breast cancer in 2023. Her last mammogram in March was normal. She is on anastrozole .  She was prescribed amoxicillin  in August for a suspected ear infection without adverse reactions in 08/2023. She has a distant history of hives, possibly related to penicillin, but has not had hives since her teenage years.    Patient Active Problem List   Diagnosis Date Noted   Neutropenia  01/21/2022   Hypothyroidism 06/04/2021   Dyslipidemia 06/04/2021   Moderate recurrent major depression (HCC) 06/04/2021   Vitamin D  deficiency 06/04/2021   Other specified disorders of bone density and structure, other site 06/04/2021   Malignant neoplasm of lower-outer quadrant of right breast of female,  estrogen receptor positive (HCC) 02/08/2021    is allergic to penicillins.  MEDICAL HISTORY: Past Medical History:  Diagnosis Date   Anxiety    Cancer (HCC)    right breast cancer   High cholesterol    History of kidney stones    Hypertension    Hypothyroidism     SURGICAL HISTORY: Past Surgical History:  Procedure Laterality Date   ABDOMINAL HYSTERECTOMY     AXILLARY SENTINEL NODE BIOPSY Right 03/02/2021   Procedure: RIGHT AXILLARY SENTINEL NODE BIOPSY;  Surgeon: Belinda Cough, MD;  Location: [facility] OR;  Service: General;  Laterality: Right;   BREAST LUMPECTOMY WITH RADIOACTIVE SEED AND SENTINEL LYMPH NODE BIOPSY Right 03/02/2021   Procedure: RIGHT BREAST LUMPECTOMY WITH RADIOACTIVE SEED;  Surgeon: Belinda Cough, MD;  Location: [facility] OR;  Service: General;  Laterality: Right;   KIDNEY SURGERY Right    NECK SURGERY     PORTACATH PLACEMENT N/A 03/31/2021   Procedure: INSERTION PORT-A-CATH;  Surgeon: Belinda Cough, MD;  Location: [facility] OR;  Service: General;  Laterality: N/A;   RE-EXCISION OF BREAST LUMPECTOMY Right 03/31/2021   Procedure: RE-EXCISION OF RIGHT BREAST LUMPECTOMY MARGINS;  Surgeon: Belinda Cough, MD;  Location: [facility] OR;  Service: General;  Laterality: Right;   WISDOM TOOTH EXTRACTION      SOCIAL HISTORY: Social History   Socioeconomic History   Marital status: Married    Spouse name: Not on file   Number of children: Not on file   Years of education: Not on file   Highest education level: Not on file  Occupational History   Not on file  Tobacco Use   Smoking status: Never   Smokeless tobacco: Never  Vaping Use   Vaping status: Never Used  Substance and Sexual Activity   Alcohol use: No   Drug use: No   Sexual activity: Yes    Birth control/protection: Surgical  Other Topics Concern   Not on file  Social History Narrative   Not on file   Social Drivers of Health   Financial Resource Strain: Not on file  Food Insecurity: Not on file  Transportation  Needs: Not on file  Physical Activity: Not on file  Stress: Not on file  Social Connections: Unknown (05/18/2021)   [facility]   Social Network    Social Network: Not on file  Intimate Partner Violence: Unknown (04/09/2021)   Received from [facility]   HITS    Physically Hurt: Not on file    Insult or Talk Down To: Not on file    Threaten Physical Harm: Not on file    Scream or Curse: Not on file    FAMILY HISTORY: No family history on file.  Review of Systems  Constitutional:  Positive for chills. Negative for appetite change, fatigue, fever and unexpected weight change.  HENT:   Negative for hearing loss, lump/mass and trouble swallowing.   Eyes:  Negative for eye problems and icterus.  Respiratory:  Negative for chest tightness, cough and shortness of breath.   Cardiovascular:  Negative for chest pain, leg swelling and palpitations.  Gastrointestinal:  Negative for abdominal distention, abdominal pain, constipation, diarrhea, nausea and vomiting.  Endocrine: Negative for hot flashes.  Genitourinary:  Negative for difficulty urinating.   Musculoskeletal:  Positive for gait problem. Negative for arthralgias.  Skin:  Negative for itching and rash.  Neurological:  Positive for gait problem and light-headedness. Negative for dizziness, extremity weakness, headaches, numbness, seizures and speech difficulty.  Hematological:  Negative for adenopathy. Does not bruise/bleed easily.  Psychiatric/Behavioral:  Negative for depression. The patient is not nervous/anxious.       PHYSICAL EXAMINATION   Onc Performance Status - 09/27/23 1010       ECOG Perf Status   ECOG Perf Status Restricted in physically strenuous activity but ambulatory and able to carry out work of a light or sedentary nature, e.g., light house work, office work      KPS SCALE   KPS % SCORE Normal, no compliants, no evidence of disease          Vitals:   09/27/23 1005  BP: 130/71  Pulse:  65  Resp: 18  Temp: (!) 97.5 F (36.4 C)  SpO2: 100%    Physical Exam Constitutional:      General: She is not in acute distress.    Appearance: Normal appearance. She is not toxic-appearing.  HENT:     Head: Normocephalic and atraumatic.     Mouth/Throat:     Mouth: Mucous membranes are moist.     Pharynx: Oropharynx is clear. No oropharyngeal exudate or posterior oropharyngeal erythema.  Eyes:     General: No scleral icterus. Cardiovascular:     Rate and Rhythm: Normal rate and regular rhythm.     Pulses: Normal pulses.     Heart sounds: Normal heart sounds.  Pulmonary:     Effort: Pulmonary effort is normal.     Breath sounds: Normal breath sounds.  Chest:     Comments: Right breast s/p lumpectomy and radiation + erythema, slight warmth, and + TTP, left breast benign Abdominal:     General: Abdomen is flat. Bowel sounds are normal. There is no distension.     Palpations: Abdomen is soft.     Tenderness: There is no abdominal tenderness.  Musculoskeletal:        General: No swelling.     Cervical back: Neck supple.  Lymphadenopathy:     Cervical: No cervical adenopathy.     Upper Body:     Right upper body: No supraclavicular or axillary adenopathy.     Left upper body: No supraclavicular or axillary adenopathy.  Skin:    General: Skin is warm and dry.     Findings: No rash.  Neurological:     General: No focal deficit present.     Mental Status: She is alert.  Psychiatric:        Mood and Affect: Mood normal.        Behavior: Behavior normal.       ASSESSMENT and THERAPY PLAN:   Assessment and Plan Assessment & Plan Right breast erythema, swelling, and tenderness (suspected infection) Symptoms suggest infection, likely mastitis. - Prescribe doxycycline  100 mg orally twice daily for 10 days. - Advise to report if symptoms do not improve by Friday afternoon for further imaging.  Right breast cancer Right breast cancer treated in 2023. Current symptoms  evaluated for infection.  Dehydration (suspected) Suspected dehydration contributing to dizziness, weakness, and nausea. - IV fluids today.       All questions were answered. The patient knows to call the clinic with any problems, questions or concerns. We can certainly see the patient much sooner if  necessary.  Total encounter time:20 minutes*in face-to-face visit time, chart review, lab review, care coordination, order entry, and documentation of the encounter time.    Morna Kendall, NP 09/27/23 10:23 AM Medical Oncology and Hematology [facility] [address] [place], [place] [address] Tel. [phone]    Fax. [phone]  *Total Encounter Time as defined by the Centers for Medicare and Medicaid Services includes, in addition to the face-to-face time of a patient visit (documented in the note above) non-face-to-face time: obtaining and reviewing outside history, ordering and reviewing medications, tests or procedures, care coordination (communications with other health care professionals or caregivers) and documentation in the medical record.

## 2023-09-29 ENCOUNTER — Ambulatory Visit (HOSPITAL_COMMUNITY)
Admission: RE | Admit: 2023-09-29 | Discharge: 2023-09-29 | Disposition: A | Discharge status: Home / Self Care | Source: Ambulatory Visit | Attending: Hematology and Oncology | Admitting: Hematology and Oncology

## 2023-09-29 DIAGNOSIS — I10 Essential (primary) hypertension: Secondary | ICD-10-CM | POA: Diagnosis not present

## 2023-09-29 DIAGNOSIS — E785 Hyperlipidemia, unspecified: Secondary | ICD-10-CM | POA: Insufficient documentation

## 2023-09-29 DIAGNOSIS — C50511 Malignant neoplasm of lower-outer quadrant of right female breast: Secondary | ICD-10-CM | POA: Insufficient documentation

## 2023-09-29 DIAGNOSIS — Z170 Estrogen receptor positive status [ER+]: Secondary | ICD-10-CM | POA: Insufficient documentation

## 2023-09-29 DIAGNOSIS — R079 Chest pain, unspecified: Secondary | ICD-10-CM | POA: Diagnosis not present

## 2023-09-29 DIAGNOSIS — Z01818 Encounter for other preprocedural examination: Secondary | ICD-10-CM | POA: Insufficient documentation

## 2023-09-29 LAB — ECHOCARDIOGRAM COMPLETE
AR max vel: 1.95 cm2
AV Area VTI: 2 cm2
AV Area mean vel: 1.63 cm2
AV Mean grad: 3 mmHg
AV Peak grad: 6.3 mmHg
Ao pk vel: 1.25 m/s
Area-P 1/2: 4.93 cm2
Calc EF: 66 %
MV VTI: 1.59 cm2
S' Lateral: 2.5 cm
Single Plane A2C EF: 66.9 %
Single Plane A4C EF: 66.8 %

## 2023-10-10 DIAGNOSIS — Z853 Personal history of malignant neoplasm of breast: Secondary | ICD-10-CM | POA: Diagnosis not present

## 2023-10-10 DIAGNOSIS — D3001 Benign neoplasm of right kidney: Secondary | ICD-10-CM | POA: Diagnosis not present

## 2023-10-10 DIAGNOSIS — Z8679 Personal history of other diseases of the circulatory system: Secondary | ICD-10-CM | POA: Diagnosis not present

## 2023-10-10 DIAGNOSIS — R42 Dizziness and giddiness: Secondary | ICD-10-CM | POA: Diagnosis not present

## 2023-10-17 DIAGNOSIS — N201 Calculus of ureter: Secondary | ICD-10-CM | POA: Diagnosis not present

## 2023-10-17 DIAGNOSIS — N200 Calculus of kidney: Secondary | ICD-10-CM | POA: Diagnosis not present

## 2023-10-17 DIAGNOSIS — N289 Disorder of kidney and ureter, unspecified: Secondary | ICD-10-CM | POA: Diagnosis not present

## 2023-10-17 DIAGNOSIS — R1032 Left lower quadrant pain: Secondary | ICD-10-CM | POA: Diagnosis not present

## 2023-11-20 ENCOUNTER — Other Ambulatory Visit: Payer: Self-pay | Admitting: Hematology and Oncology

## 2023-11-23 ENCOUNTER — Ambulatory Visit: Admitting: Cardiology

## 2024-05-15 ENCOUNTER — Ambulatory Visit: Admitting: Hematology and Oncology

## 2024-08-27 ENCOUNTER — Ambulatory Visit: Admitting: Hematology and Oncology
